# Patient Record
Sex: Male | Born: 1960
Health system: Southern US, Community
[De-identification: ages and names within clinical notes are randomized; demographics above are authoritative.]

## PROBLEM LIST (undated history)

## (undated) DIAGNOSIS — E1169 Type 2 diabetes mellitus with other specified complication: Secondary | ICD-10-CM

## (undated) DIAGNOSIS — E785 Hyperlipidemia, unspecified: Secondary | ICD-10-CM

## (undated) DIAGNOSIS — M5412 Radiculopathy, cervical region: Secondary | ICD-10-CM

## (undated) DIAGNOSIS — I639 Cerebral infarction, unspecified: Secondary | ICD-10-CM

## (undated) DIAGNOSIS — T7840XA Allergy, unspecified, initial encounter: Secondary | ICD-10-CM

## (undated) DIAGNOSIS — E119 Type 2 diabetes mellitus without complications: Secondary | ICD-10-CM

## (undated) HISTORY — DX: Radiculopathy, cervical region: M54.12

## (undated) HISTORY — DX: Type 2 diabetes mellitus with other specified complication: E11.69

## (undated) HISTORY — PX: VARICOSE VEIN SURGERY: SHX832

## (undated) HISTORY — DX: Type 2 diabetes mellitus with other specified complication: E78.5

---

## 2000-08-30 ENCOUNTER — Encounter: Payer: Self-pay | Admitting: Emergency Medicine

## 2000-08-30 ENCOUNTER — Emergency Department (HOSPITAL_COMMUNITY): Admission: EM | Admit: 2000-08-30 | Discharge: 2000-08-30 | Payer: Self-pay | Admitting: Emergency Medicine

## 2000-09-07 ENCOUNTER — Emergency Department (HOSPITAL_COMMUNITY): Admission: EM | Admit: 2000-09-07 | Discharge: 2000-09-07 | Payer: Self-pay | Admitting: Emergency Medicine

## 2000-09-07 ENCOUNTER — Encounter: Payer: Self-pay | Admitting: Emergency Medicine

## 2000-09-07 ENCOUNTER — Encounter: Payer: Self-pay | Admitting: *Deleted

## 2001-04-25 ENCOUNTER — Inpatient Hospital Stay (HOSPITAL_COMMUNITY): Admission: EM | Admit: 2001-04-25 | Discharge: 2001-05-03 | Payer: Self-pay

## 2001-04-25 ENCOUNTER — Encounter (INDEPENDENT_AMBULATORY_CARE_PROVIDER_SITE_OTHER): Payer: Self-pay | Admitting: *Deleted

## 2001-04-27 ENCOUNTER — Encounter: Payer: Self-pay | Admitting: Endocrinology

## 2001-04-29 ENCOUNTER — Encounter: Payer: Self-pay | Admitting: Endocrinology

## 2001-05-01 ENCOUNTER — Encounter: Payer: Self-pay | Admitting: Internal Medicine

## 2001-05-24 ENCOUNTER — Encounter: Payer: Self-pay | Admitting: Thoracic Surgery

## 2001-05-24 ENCOUNTER — Encounter: Admission: RE | Admit: 2001-05-24 | Discharge: 2001-05-24 | Payer: Self-pay | Admitting: Thoracic Surgery

## 2001-07-20 ENCOUNTER — Ambulatory Visit (HOSPITAL_COMMUNITY): Admission: RE | Admit: 2001-07-20 | Discharge: 2001-07-20 | Payer: Self-pay | Admitting: Anesthesiology

## 2001-07-20 ENCOUNTER — Encounter: Payer: Self-pay | Admitting: Anesthesiology

## 2002-04-07 ENCOUNTER — Emergency Department (HOSPITAL_COMMUNITY): Admission: EM | Admit: 2002-04-07 | Discharge: 2002-04-08 | Payer: Self-pay | Admitting: Emergency Medicine

## 2002-04-08 ENCOUNTER — Encounter: Payer: Self-pay | Admitting: Emergency Medicine

## 2002-07-31 ENCOUNTER — Emergency Department (HOSPITAL_COMMUNITY): Admission: EM | Admit: 2002-07-31 | Discharge: 2002-07-31 | Payer: Self-pay | Admitting: Emergency Medicine

## 2002-07-31 ENCOUNTER — Encounter: Payer: Self-pay | Admitting: Emergency Medicine

## 2002-08-13 ENCOUNTER — Emergency Department (HOSPITAL_COMMUNITY): Admission: EM | Admit: 2002-08-13 | Discharge: 2002-08-13 | Payer: Self-pay | Admitting: Emergency Medicine

## 2002-08-14 ENCOUNTER — Ambulatory Visit (HOSPITAL_COMMUNITY): Admission: RE | Admit: 2002-08-14 | Discharge: 2002-08-14 | Payer: Self-pay | Admitting: Emergency Medicine

## 2003-04-02 ENCOUNTER — Ambulatory Visit (HOSPITAL_COMMUNITY): Admission: RE | Admit: 2003-04-02 | Discharge: 2003-04-02 | Payer: Self-pay | Admitting: Endocrinology

## 2003-04-17 ENCOUNTER — Ambulatory Visit (HOSPITAL_COMMUNITY): Admission: RE | Admit: 2003-04-17 | Discharge: 2003-04-17 | Payer: Self-pay | Admitting: Gastroenterology

## 2003-04-17 ENCOUNTER — Encounter (INDEPENDENT_AMBULATORY_CARE_PROVIDER_SITE_OTHER): Payer: Self-pay | Admitting: Specialist

## 2003-07-15 ENCOUNTER — Encounter: Admission: RE | Admit: 2003-07-15 | Discharge: 2003-07-15 | Payer: Self-pay | Admitting: Pulmonary Disease

## 2004-08-18 ENCOUNTER — Encounter: Admission: RE | Admit: 2004-08-18 | Discharge: 2004-08-18 | Payer: Self-pay | Admitting: Endocrinology

## 2005-01-06 ENCOUNTER — Ambulatory Visit (HOSPITAL_COMMUNITY): Admission: RE | Admit: 2005-01-06 | Discharge: 2005-01-06 | Payer: Self-pay | Admitting: Endocrinology

## 2005-07-11 ENCOUNTER — Emergency Department (HOSPITAL_COMMUNITY): Admission: EM | Admit: 2005-07-11 | Discharge: 2005-07-11 | Payer: Self-pay | Admitting: Emergency Medicine

## 2006-02-10 ENCOUNTER — Ambulatory Visit: Payer: Self-pay | Admitting: Internal Medicine

## 2006-03-14 ENCOUNTER — Ambulatory Visit: Payer: Self-pay | Admitting: Internal Medicine

## 2006-08-09 ENCOUNTER — Ambulatory Visit: Payer: Self-pay | Admitting: Internal Medicine

## 2006-08-14 ENCOUNTER — Emergency Department (HOSPITAL_COMMUNITY): Admission: EM | Admit: 2006-08-14 | Discharge: 2006-08-14 | Payer: Self-pay | Admitting: Emergency Medicine

## 2006-10-05 ENCOUNTER — Ambulatory Visit: Payer: Self-pay | Admitting: Internal Medicine

## 2006-11-07 ENCOUNTER — Ambulatory Visit: Payer: Self-pay | Admitting: Internal Medicine

## 2006-11-30 ENCOUNTER — Ambulatory Visit: Payer: Self-pay | Admitting: Internal Medicine

## 2007-01-09 ENCOUNTER — Ambulatory Visit: Payer: Self-pay | Admitting: Internal Medicine

## 2007-01-16 ENCOUNTER — Encounter: Admission: RE | Admit: 2007-01-16 | Discharge: 2007-01-16 | Payer: Self-pay | Admitting: Sports Medicine

## 2007-05-10 DIAGNOSIS — E291 Testicular hypofunction: Secondary | ICD-10-CM | POA: Insufficient documentation

## 2007-05-10 DIAGNOSIS — Q984 Klinefelter syndrome, unspecified: Secondary | ICD-10-CM | POA: Insufficient documentation

## 2007-05-10 DIAGNOSIS — R05 Cough: Secondary | ICD-10-CM

## 2007-05-10 DIAGNOSIS — J45909 Unspecified asthma, uncomplicated: Secondary | ICD-10-CM | POA: Insufficient documentation

## 2007-05-10 DIAGNOSIS — R82998 Other abnormal findings in urine: Secondary | ICD-10-CM | POA: Insufficient documentation

## 2007-05-10 DIAGNOSIS — R059 Cough, unspecified: Secondary | ICD-10-CM | POA: Insufficient documentation

## 2007-05-10 DIAGNOSIS — K219 Gastro-esophageal reflux disease without esophagitis: Secondary | ICD-10-CM | POA: Insufficient documentation

## 2007-05-11 ENCOUNTER — Ambulatory Visit: Payer: Self-pay | Admitting: Internal Medicine

## 2007-05-18 ENCOUNTER — Ambulatory Visit: Payer: Self-pay | Admitting: Internal Medicine

## 2007-05-18 DIAGNOSIS — R0989 Other specified symptoms and signs involving the circulatory and respiratory systems: Secondary | ICD-10-CM | POA: Insufficient documentation

## 2007-05-18 DIAGNOSIS — R0609 Other forms of dyspnea: Secondary | ICD-10-CM | POA: Insufficient documentation

## 2007-06-16 ENCOUNTER — Telehealth (INDEPENDENT_AMBULATORY_CARE_PROVIDER_SITE_OTHER): Payer: Self-pay | Admitting: *Deleted

## 2007-06-19 ENCOUNTER — Telehealth: Payer: Self-pay | Admitting: Internal Medicine

## 2007-07-10 ENCOUNTER — Ambulatory Visit: Payer: Self-pay | Admitting: Internal Medicine

## 2007-07-12 ENCOUNTER — Telehealth (INDEPENDENT_AMBULATORY_CARE_PROVIDER_SITE_OTHER): Payer: Self-pay | Admitting: *Deleted

## 2007-07-16 DIAGNOSIS — R222 Localized swelling, mass and lump, trunk: Secondary | ICD-10-CM | POA: Insufficient documentation

## 2007-07-16 DIAGNOSIS — J64 Unspecified pneumoconiosis: Secondary | ICD-10-CM | POA: Insufficient documentation

## 2007-08-24 ENCOUNTER — Telehealth: Payer: Self-pay | Admitting: Internal Medicine

## 2007-08-29 ENCOUNTER — Ambulatory Visit: Payer: Self-pay | Admitting: Internal Medicine

## 2007-08-29 ENCOUNTER — Ambulatory Visit: Admission: RE | Admit: 2007-08-29 | Discharge: 2007-08-29 | Payer: Self-pay | Admitting: Internal Medicine

## 2007-08-29 ENCOUNTER — Encounter: Payer: Self-pay | Admitting: Internal Medicine

## 2007-09-12 ENCOUNTER — Telehealth: Payer: Self-pay | Admitting: Internal Medicine

## 2007-09-15 ENCOUNTER — Ambulatory Visit: Payer: Self-pay | Admitting: Internal Medicine

## 2007-10-25 ENCOUNTER — Telehealth: Payer: Self-pay | Admitting: Internal Medicine

## 2007-12-11 ENCOUNTER — Telehealth: Payer: Self-pay | Admitting: Internal Medicine

## 2007-12-15 ENCOUNTER — Ambulatory Visit: Payer: Self-pay | Admitting: Internal Medicine

## 2007-12-15 DIAGNOSIS — J018 Other acute sinusitis: Secondary | ICD-10-CM | POA: Insufficient documentation

## 2010-04-16 ENCOUNTER — Encounter: Admit: 2010-04-16 | Payer: Self-pay | Admitting: Endocrinology

## 2010-09-01 NOTE — Op Note (Signed)
NAME:  [name], [name] NO.:  [id number]   MEDICAL RECORD NO.:  [id number]          PATIENT TYPE:  AMB   LOCATION:  CARD                         FACILITY:  [facility]   PHYSICIAN:  Clinton D. Maple Hudson, MD, FCCP, FACPDATE OF BIRTH:  14-Mar-1959   DATE OF PROCEDURE:  08/29/2007  DATE OF DISCHARGE:                               OPERATIVE REPORT   OPERATION/PROCEDURE:  Bronchoscopy.   INDICATIONS FOR PROCEDURE:  A 50 year old man with debilitating chronic  cough,  remote history of mediastinoscopy for mediastinal adenopathy  which had shown sinus histiocytes, occupational history of exposure to  respiratory irritant fumes.  Bronchoscopy is performed to evaluate for  endobronchial abnormality contributing to cough.  Chest x-ray and chest  CT scan views were reviewed on computer on the day before the procedure  with no significant parenchymal abnormality.  See outpatient history and  physical information.   DESCRIPTION OF PROCEDURE:  After fully informed consent, bronchoscopy  was performed on an outpatient basis in the endoscopy suite.  The upper  airway was anesthetized with topical Xylocaine.  A cumulative dose of 5  mg of intravenous Versed was given for additional sedation and cough  control.  Supplemental oxygen was provided to hold saturation over 93%.  Cardiac monitor showed normal sinus rhythm.  A video fiberscope was  introduced via the right nostril to the level of the vocal cords without  difficulty.  The cords moved normally.  Cough was moderate.  The trachea  and main carina were unremarkable.  Sequential examination of each lobar  and segmental airway to the fourth division level was performed.  There  was a generalized mild bronchitis with friable mucosa.  Cough continued  during the procedure and secretions became foamy and blood-tinged  suggesting an edema component.  There was no stridor or apparent  respiratory distress other than the cough.  At one point  fluid was seen  passing the scope tip suggestive of an aspiration event, small in volume  and not discolored.  The right middle lobe and right upper lobe were  lavaged with saline.  Under fluoroscopic guidance,  brushing was  performed in the anterior segment of the right upper lobe and in the  right middle lobe returning clear fluid.  No endobronchial lesions,  foreign material or other obvious abnormality was seen except as  described.  The procedure was concluded without apparent complication.  He was conversational on the bronchoscope was removed.  He will be held  until stable and then released home with family to outpatient follow-up.   FINAL IMPRESSION:  Nonspecific chronic bronchitis.      Clinton D. Maple Hudson, MD, Tonny Bollman, FACP  Electronically Signed     CDY/MEDQ  D:  08/29/2007  T:  08/29/2007  Job:  [id number]

## 2010-09-01 NOTE — Assessment & Plan Note (Signed)
[facility]                             PULMONARY OFFICE NOTE   NAME:[name], Ricky GRABE                     MRN:          [id number]  DATE:01/09/2007                            DOB:          29-Jan-1961    PROBLEMS:  1. Cough/occupational asthma.  2. Elevated chrome level in his hair.  3. Elevated urine creatinine of 3.5.  4. Esophageal reflux.  5. Klinefelter's syndrome/hypogonadism.  6. History of mediastinal biopsy/histiocytosis/adenopathy.   HISTORY:  Less cough using Asmanex on a regular basis. He has felt  fairly stable. No new respiratory issues. He had seen the nurse  practitioner in August and was treated for a URI, and that has resolved.  Medication list is reviewed without changes noted except that he is  using Asmanex 1 puff b.i.d.   OBJECTIVE:  Weight 246 pounds, pulse regular. Dry cough with forced  expiration, but no wheeze. Heart sounds regular without murmur.   IMPRESSION:  Chronic bronchitis and occupational asthma, basic pattern  is stable. He seems to be benefitting from maintenance steroid inhaler.   PLAN:  Continue Asmanex. Schedule return 4 months, earlier p.r.n.     Clinton D. Maple Hudson, MD, Tonny Bollman, FACP  Electronically Signed    CDY/MedQ  DD: 01/14/2007  DT: 01/15/2007  Job #: [id number]   cc:   Brooke Bonito, M.D.

## 2010-09-01 NOTE — Assessment & Plan Note (Signed)
[facility]                             PULMONARY OFFICE NOTE   NAME:TEAGUEKeefe, Zawistowski                     MRN:          [id number]  DATE:11/30/2006                            DOB:          07-30-1960    HISTORY OF PRESENT ILLNESS:  The patient is a 50 year old white male  patient of Dr. Roxy Cedar who has a known history of occupational asthma,  cyclical cough, and esophageal reflux.  The patient presents today for  an acute office visit complaining of a 3-day history of nasal  congestion, sore throat, ear pain, cough, and fever and chills.  The  patient reports that he has severe coughing paroxysms to the point where  it has caused him to vomit.  The patient denies any hemoptysis,  orthopnea, PND, leg swelling, abdominal pain, nausea, bloody stools or  diarrhea.   PAST MEDICAL HISTORY:  Reviewed.   CURRENT MEDICATIONS:  Reviewed.   PHYSICAL EXAMINATION:  The patient is a pleasant male in no acute  distress.  He is afebrile with stable vital signs.  HEENT:  Nasal mucosa is pink and moist.  Nontender sinuses.  TMs are  normal.  Posterior pharynx is with some mild erythema, no exudate.  NECK:  Supple without cervical adenopathy.  No JVD.  LUNG SOUNDS:  Reveal coarse breath sounds with a few faint wheezes.  CARDIAC:  Regular rate.  ABDOMEN:  Soft and nontender.  No palpable hepatosplenomegaly.  EXTREMITIES:  Warm without an calf tenderness, cyanosis, clubbing, or  edema.   IMPRESSION AND PLAN:  Acute upper respiratory infection with a mild  asthmatic flare.  The patient is given a Xopenex nebulizer treatment  today in the office.  Asked to begin a short prednisone taper over the  next week and may use Phenergan VC with codeine for cough control.  Add  in Mucinex DM twice daily.  The patient will return back with Dr. Maple Hudson  as scheduled or sooner if needed.      Rubye Oaks, NP  Electronically Signed      Clinton D. Maple Hudson, MD, Tonny Bollman, FACP  Electronically Signed   TP/MedQ  DD: 12/01/2006  DT: 12/02/2006  Job #: [id number]

## 2010-09-01 NOTE — Assessment & Plan Note (Signed)
[facility]                             PULMONARY OFFICE NOTE   NAME:[name], [name]                     MRN:          [id number]  DATE:10/05/2006                            DOB:          [birth year]    HISTORY OF PRESENT ILLNESS:  Patient is a 50 year old white male patient  of [name], who has a known history of occupational asthma with  previous exposure to elevated chromium levels.  Patient presents today  for an acute office visit with two-day history of increased dry cough,  wheezing and shortness of breath.  Patient had recently been laid off  from work two months ago with substantial improvement in his symptoms,  maintained on Azmacort two puffs three times a day.  Patient recently  started back to work, working outside in extreme temperatures of greater  than 100 degrees outside with heavy protective clothing on.  Patient  reports that his breathing worsened acutely with increased shortness of  breath, dry cough and wheezing.  The patient denies any chest pain,  orthopnea, PND or leg swelling, fever or purulent sputum.  Patient did  start prednisone yesterday with only minimum improvement in symptoms.   PAST MEDICAL HISTORY:  Reviewed.   CURRENT MEDICATIONS:  Reviewed.   PHYSICAL EXAM:  Patient is a pleasant male, in no acute distress.  He is  afebrile with stable vital signs.  His O2 saturation is 97% on room air.  HEENT:  Unremarkable.  NECK:  Supple without cervical adenopathy.  No JVD.  LUNG SOUNDS:  Reveal coarse breath sounds bilaterally.  CARDIAC:  Regular rate.  ABDOMEN:  Soft and nontender.  EXTREMITIES:  Warm without any edema.   IMPRESSION AND PLAN:  Acute asthmatic flare.  Probably secondary to  environmental triggers.  Patient was given a Xopenex nebulizer treatment  in the office.  He is to finish prednisone as scheduled.  Add in Mucinex  DM twice daily.  May use Tussionex as needed for cough control.  Continue on  reflux prevention, which could be irritating the upper  airways.  Patient has been recommended to avoid extreme temperatures,  which seem to be exacerbating his asthma symptoms, and has been given a  work note.  I have discussed in detail with this patient concerning  possibility that he is going to be unable to do work that will expose  him to  extreme temperatures and heat or chemicals or fumes.  Patient will  return back with [name] in two weeks, or sooner, if needed.      Rubye Oaks, NP  Electronically Signed      Ricky D. Maple Hudson, MD, Tonny Bollman, FACP  Electronically Signed   TP/MedQ  DD: 10/05/2006  DT: 10/05/2006  Job #: [id number]

## 2010-09-01 NOTE — Assessment & Plan Note (Signed)
[name]                             PULMONARY OFFICE NOTE   NAME:[name], [name]                     MRN:          [id number]  DATE:11/07/2006                            DOB:          [birth year]    PULMONARY OFFICE FOLLOWUP   PROBLEM LIST:  1. Cough/occupational asthma.  2. Elevated chrome level in his hair.  3. Elevated urine creatinine of 3.5.  4. Esophageal reflux.  5. Klinefelter's syndrome/hypogonadism.  6. History of mediastinal biopsy/histiocytosis/adenopathy.   HISTORY:  He was seen by the nurse practitioner because of increased  cough, wheeze, and shortness of breath that seemed related to his  attempts to work a new job for a while where he had to wear heavy  protective clothing outdoors in high temperatures.  He is now applying  for disability.  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  Azmacort may help some being used 2 puffs q.i.d. still with samples.  He  does not feel reflux symptoms and had been told to try tapering off of  Nexium to see what effect that would have.  Cough remains nonproductive.  He does not seem short of breath sitting quietly and he has not had  exertional chest pain.   MEDICATIONS:  1. Crestor.  2. AndroGel.  3. Nexium 40 mg b.i.d.  4. Aspirin 325 mg.  5. He did not start the metformin that was prescribed 500 mg daily.  6. He continues Azmacort 2 puffs q.i.d.  7. Has not been needing albuterol.  8. Has used occasional Tussionex.  No medication allergy.   OBJECTIVE:  Weight 249 pounds, BP 142/84, pulse 86, room air saturation  96%.  Intermittent nonproductive cough.  No wheeze or rhonchi.  I do not find adenopathy.  Neck veins are not distended.  There is no visible post-nasal drainage or pharyngeal erythema.  Voice  quality is normal.  Heart  sounds seem regular without murmur or gallop.  EXTREMITIES:  Without cyanosis, clubbing, or edema.   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  Cough is a significant distracting factor, which will by  itself prevent him from participating in some jobs I would expect.  Certainly, he needs to stay away from any job associated with  respiratory irritants, smokes, strong odors, or temperature extremes.   PLAN:  1. Try changing Azmacort to a more potent steroid inhaler giving      Asmanex 1 puff b.i.d.  2. Scheduled return to 3 months to assess change in fall weather.     Clinton D. Maple Hudson, MD, Tonny Bollman, FACP  Electronically Signed    CDY/MedQ  DD: 11/09/2006  DT: 11/10/2006  Job #: [id number]   cc:   Brooke Bonito, M.D.

## 2010-09-04 NOTE — Op Note (Signed)
NAME:  [name], [name] NO.:  [id number]   MEDICAL RECORD NO.:  [id number]                   PATIENT TYPE:  AMB   LOCATION:  ENDO                                 FACILITY:  [facility]   PHYSICIAN:  John C. Madilyn Fireman, M.D.                 DATE OF BIRTH:  1960/09/19   DATE OF PROCEDURE:  04/17/2003  DATE OF DISCHARGE:                                 OPERATIVE REPORT   PROCEDURE:  Colonoscopy with polypectomy.   INDICATIONS FOR PROCEDURE:  Family history of colon cancer in a first-degree  relative.   DESCRIPTION OF PROCEDURE:  The patient was placed in the left lateral  decubitus position and placed on the pulse monitor with continuous low-flow  oxygen delivered by nasal cannula.  He was sedated with 75 mcg IV fentanyl  and 6 mg IV Versed.  The Olympus video colonoscope was inserted into the  rectum and advanced to the cecum, confirmed by transillumination of  McBurney's point and visualization of the ileocecal valve and appendiceal  orifice.  Prep was excellent.  The cecum and ascending colon appeared normal  with no masses, polyps, diverticula, or other mucosal abnormalities.  There  was a 6-7 cm sessile polyp in the transverse colon that was fulgurated by  hot biopsy.  The remainder of the transverse, descending, sigmoid and rectum  appeared normal with no further polyps, masses, diverticula or other mucosal  abnormalities.  The colonoscope was then withdrawn and the patient returned  to the recovery room in stable condition.  He tolerated the procedure well  and there were no immediate complications.   IMPRESSION:  Small transverse colon polyp; otherwise normal study.   PLAN:  Await histology and probably repeat colonoscopy in five years based  on the same history.                                               John C. Madilyn Fireman, M.D.    JCH/MEDQ  D:  04/17/2003  T:  04/17/2003  Job:  [id number]   cc:   Jeannett Senior A. Evlyn Kanner, M.D.  [address]  [place]  [place] [address]  Fax: [phone]

## 2010-09-04 NOTE — Cardiovascular Report (Signed)
[facility]  Patient:    [name], [name] Visit Number: [id number] MRN: [id number]          Service Type: MED Location: [phone] Attending Physician:  Julian Hy Dictated by:   Runell Gess, M.D. Proc. Date: 04/26/01 Admit Date:  04/25/2001   CC:         Second Floor [facility]  Jeannett Senior A. Evlyn Kanner, M.D., [facility] [address], [place] [address]   Cardiac Catheterization  PROCEDURE:  Cardiac catheterization.  CARDIOLOGIST:  Runell Gess, M.D.  INDICATIONS:  [name] is a 50 year old married white male with adopted children, who has Klinefelter disease and hypogonadism, admitted January 7 with chest pain, rule out MI.  He has no cardiac risk factors.  His symptoms were typical, and his CPKs were elevated, though his MBs and troponins were negative.  He had no EKG changes.  He presents now for diagnostic coronary arteriography.  DESCRIPTION OF PROCEDURE:  The patient was brought to the second floor  Cardiac Catheterization lab in the postabsorptive state.  He was premedicated with p.o. Valium.  His right groin was prepped and shaved in the usual sterile fashion.  Xylocaine 1% was used for local anesthesia.  A 6-French sheath was inserted into the right femoral artery using standard Seldinger technique.  Then 6-French right and left Judkins diagnostic catheters along with a 6-French pigtail catheter were used for selective coronary angiography, left ventriculography, supravalvular aortography in the LAO cranial view.   Omnipaque dye was used for the entirety of the diagnostic case.  Retrograde aortic, left ventricular, and pullback pressures were recorded.  HEMODYNAMICS: 1. Aortic systolic pressure 104, diastolic pressure 68. 2. Left ventricular systolic pressure 107, diastolic pressure 11.  SELECTIVE CORONARY ANGIOGRAPHY: 1. Left  Main: Normal. 2. LAD: Normal. 3. Left Circumflex:  Dominant and normal. 4. Right coronary artery:  Nondominant and normal.  LEFT VENTRICULOGRAPHY:  RAO left ventriculogram was performed using 25 cc of Omnipaque dye at 12 cc/second.  The overall LV EF was estimated at greater than 60% without focal wall motion abnormalities.  SUPRAVALVULAR AORTOGRAPHY:  Supravalvular aortography was performed in the LAO cranial view using 20 cc of Omnipaque dye at 20 cc/second x 2.  There was trace AI noted.  There was no evidence of aortic dissection.  All vessels were intact.  IMPRESSION:  [name] has normal coronary arteries, normal LV function, and no evidence of aortic dissection.  I believe his chest pain is noncardiac and most like musculoskeletal/pleuritic.  The sheaths were removed and pressure held to the groin to achieve hemostasis.  The patient left the lab in stable condition.  PLAN:  Discharge him tomorrow hopefully on nonsteroidal anti-inflammatory drug.  He will see me back as needed.  He left the lab in stable condition. Dictated by:   Runell Gess, M.D. Attending Physician:  Julian Hy DD:  04/26/01 TD:  04/26/01 Job: [id number]
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
## 2010-09-04 NOTE — Assessment & Plan Note (Signed)
[facility]                             PULMONARY OFFICE NOTE   NAME:TEAGUETeagon, [name]                     MRN:          [id number]  DATE:03/14/2006                            DOB:          Jan 16, 1961    PROBLEM:  1. Cough/occupational asthma.  2. Elevated chrome level in his hair.  3. Elevated urine creatinine of 3.5.  4. Esophageal reflux.  5. Klinefelter's syndrome/hypogonadism.  6. History of mediastinal biopsy/history of cytosis/adenopathy.   HISTORY:  He still has a lot of cough after trying Nexium 40 mg daily.  He could not tell that Advair previously had helped any.  There is  little sputum and not much wheeze.  He has tried Symbicort 160/4.5 given  at his initial visit here but still has persistent cough and a sense of  pressure standing on his chest.  There has been no real pain.  No  fever or adenopathy.  No edema and he is not actively aware of any  change in reflux symptoms.   MEDICATION:  1. Crestor.  2. AndroGel.  3. Nexium.  4. Aspirin 325 mg.  5. Albuterol HFA.   NO MEDICATION ALLERGY.   Note that he recently finished Symbicort 160/4.5 and Singulair without  noting a benefit.  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   OBJECTIVE:  Weight 235 pounds.  BP 122/80.  Pulse regular 96.  Room air  saturation 95%.  Dry cough, otherwise no distress.  Breath sounds are a little diminished because he cannot relax to take a  full deep breath but I hear no wheeze, rales, or rhonchi.  There is no  stridor or neck pain distention.  Heart sounds are regular without murmur.  No visible postnasal drip.  No  cyanosis or clubbing.   Spirometry from January 06, 2005, had been within normal limits with a  slight response to bronchodilator only in the small airway/mid flows.  MVV was somewhat reduced  suggesting weakness or weak effort.   IMPRESSION:  1. Active cough likely to represent ongoing airway irritation most      consistent with occupational asthma and best treated by removal      from that exposure.  2. I am not convinced that the chrome levels by themselves are      contributing to current symptoms but I would like him to have and      occupational health evaluation.  He does not have Science writer.  We had discussed referral and he would      like referral to the [facility] Occupational Health program recognizing      he may be personally responsible for the bills in the absence of      Workman's Comp.   PLAN:  1. Prednisone 8-day taper from 40 mg daily using 5 mg tablets with      careful steroid discussion done.  2. Appointment with Dr. Raynelle Jan at [facility]  Occupational Health      Program on November 26.  3. I will be happy to see him again here if I can be helpful.     Clinton D. Maple [place], MD, [facility], FACP  Electronically Signed    CDY/MedQ  DD: 03/19/2006  DT: 03/21/2006  Job #: [id number]   cc:   Brooke Bonito, M.D.

## 2010-09-04 NOTE — Discharge Summary (Signed)
[place]  Patient:    [name], [name] Visit Number: [id number] MRN: [id number]          Service Type: MED Location: [phone] Attending Physician:  Julian Hy Dictated by:   Tera Mater Evlyn Kanner, M.D. Admit Date:  04/25/2001 Discharge Date: 05/03/2001                             Discharge Summary  DISCHARGE DIAGNOSES: 1. Chest pain, source uncertain despite multiple tests. 2. Splenomegaly. 3. Klinefelter syndrome. 4. Mediastinal and paratracheal adenopathy, biopsy showing histiocytosis. 5. Probable early impaired glucose tolerance. 6. Hypergonadism.  PROCEDURES: 1. Rule out myocardial infarction protocol. 2. Cardiac catheterization. 3. CT of the chest. 4. Abdominal ultrasound. 5. Mediastinoscopy and bronchoscopy. 6. Telemetry monitoring.  HISTORY OF PRESENT ILLNESS:  Mr. Bihl is a 50 year old white male with a history of Klinefelter syndrome, splenomegaly, and hypergonadism, who presented to my partner with chest pain on 04/26/01.  He had very typical sounding substernal chest pain with relief with nitroglycerin and arm radiation of the pain.  Given the typical nature and course of this, as well as abnormal cardiac enzymes at presentation, he was seen promptly by cardiology, and underwent a cardiac catheterization.  Fortunately, this did not show a cardiac defect of any type.  His pain continued, and GI consultation was undertaken, who thought this could be musculoskeletal.  The initial therapy trying to treat inflammation was unsuccessful, both with steroids and nonsteroidals.  He has required significant pain medications throughout his hospitalization to help keep this pain under control, and in fact, it still does continue to the present with only partial relief.  Due to the continued and worsening pain situation, he underwent a CT of the chest on 04/27/01, to rule out aortic dissection, and a right paratracheal mass  was found.  He was seen by Dr. Karle Plumber, and underwent mediastinoscopy and bronchoscopy, fortunately showing no carcinoma or specific diagnosis underlying this.  His continued pain and need for IV medications has required an extended hospitalization.  We have gone through a variety of possible diagnostic possibilities, including rule out for cardiac disease, pulmonary embolus, dissection, GI source, etc., and we are basically left with the lack of a specific diagnosis despite these multiple problems.  This extensive evaluation was undertaken due to his lack of prior history of any pain-seeking behavior, as well as the fact that he did have abnormal cardiac enzymes and pain that was clearly relieved with nitroglycerin.  At the present time, we have reached probable maximal hospital benefit.  We still have pain control issues, and these will be addressed by the addition of Neurontin 300 mg at bedtime to see if we cannot help this situation.  New problems identified while here include some abnormal sugars while on steroids, as well as some dyslipidemia which may require therapy.  Due to the muscular nature of this complaint, I am reluctant to start his statin yet, the numbers are not high enough to be dangerous at this moment.  There had been ongoing questions by the family about whether there is any relationship to the fact that the patient has worked in a [employer]	[place] for 24 years, first around ______ sulfate, and then around hydrochloric acid and hydrofluoric acid more recently.  I cannot find any direct toxic nature to this current illness, although certainly we have not done heavy metal stains or anything other than these  specimens.  LABORATORY DATA:  TSH 0.379.  Urine was basically negative.  Initial white count was 6400, hemoglobin 15.9, platelets 170,000, MCV 86.  Blood gas showed a pH of 7.420, PCO2 of 42, bicarbonate of 27.  INR 1.1, PTT 30.  Initial sodium 140, potassium  3.4, chloride 103, CO2 29, BUN 16, creatinine 1.0, calcium 9.7.  Total protein 7.1, albumin 4.1.  Glucose went as high as 183 on one occasion post-prandially.  Potassium slightly low at 3.4.  First CPK was 916, MB 8.9, relative index of 1.  Repeat was CPK 651, MB 6.5, relative index 1.0.  Troponins were 0.01 and 0.01.  Repeat CPK was 175, CK 1.1.  CK yesterday was down to normal range at 41.  His LDL was 155, HDL 42, triglycerides 169, total cholesterol 231.  A benign lymph node was the final diagnosis with sinus histocytosis, pigmented macrophagia, no evidence of malignancy.  Brushings and washings showed no malignant cells.  Ultrasound of the abdomen was normal.  CT of the chest showed no evidence of aortic dissection or PE.  There was a 3.2 cm soft tissue mass which is what was biopsied.  Chest x-ray showed subsegmental atelectasis and lingula.  A cardiac catheterization showed normal coronary arteries, normal pressures, normal valves, and normal ejection fraction.  Echocardiogram showed normal left ventricular size, overall left ventricular systolic function was normal at 55 to 65%.  There was signal drop out in the intra-atrial septum, and it was said that they could not exclude patent foramen ovale or ASD, but there was no clinically indication to follow up on this at the present.  Electrocardiogram was sinus rhythm without ischemic changes.  There were no problems like this noted on the cardiac catheterization or pressure changes noted on the cardiac catheterization.  In summary, we have a 50 year old white male presenting with an acute chest pain syndrome with abnormal CPK, and the possibility of cardiac disease was entertained.  He has undergone a very extensive workup with a still somewhat obscured diagnosis.  This has required a prolonged hospitalization.  He is now discharged home in improved, but not normal condition, given he is still having pain.  His pain management will  start with Percocet two pills q.6h. p.r.n., he will be given 60 pills.  He will be continued on Protonix.  He will go back on his Androgel.  He will be on an aspirin daily, and he will have a  new medication of Neurontin 300 mg at bedtime added.  FOLLOWUP: 1. With a pulmonologist for an outpatient consult. 2. He will be seen by me in two weeks as well.  Reports of pain or other problems will be undertaken as clinically indicated. Dictated by:   Tera Mater Evlyn Kanner, M.D. Attending Physician:  Julian Hy DD:  05/03/01 TD:  05/04/01 Job: [id number]

## 2010-09-04 NOTE — Assessment & Plan Note (Signed)
[facility]                             PULMONARY OFFICE NOTE   NAME:TEAGUEGlenford, Garis                     MRN:          [id number]  DATE:08/09/2006                            DOB:          1960-05-28    PROBLEMS:  1. Cough/occupational asthma.  2. Elevated chrome level in his hair.  3. Elevated urine creatinine of 3.5.  4. Esophageal reflux.  5. Klinefelter's syndrome/hypogonadism.  6. History of mediastinal biopsy/histiocytosis/adenopathy.   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  He has not  been able to get established with an occupational health firm,  apparently because they want to have Worker's Compensation coverage,  which he does not have.  Dr. Juleen China has given him some Skelaxin for a  sore neck.  Prednisone taper that we gave in November seemed to help  temporarily with his breathing.  Mainly, he notices persistent dry  cough.   MEDICATIONS:  Crestor, AndroGel, Nexium, aspirin 325 mg, multivitamin  p.r.n., albuterol, Skelaxin.   No medication allergy.   OBJECTIVE:  VITAL SIGNS:  Weight 242 pounds.  BP 126/86.  Pulse regular  at 95.  Room air saturation 96%.  HEENT:  Dry cough.  No wheeze.  No dullness.  No adenopathy or stridor.  No neck vein distention.  His pharynx is clear.  HEART:  Heart sounds are regular without murmur or gallop.  ABDOMEN:  I do not feel enlargement of liver or spleen.  EXTREMITIES:  There is no edema.   IMPRESSION:  Occupational asthma:  Remains the most appropriate  diagnosis at this point, given his prolonged exposure to metal plating.  Getting out of that environment may be the best thing that could happen  to him.  The bronchodilator medications do not seem to have helped.  If  prednisone helped, then we may get some benefit from an inhaled  cortisone on a maintenance basis.  I am giving him some samples we have  available.   PLAN:  1. Watch for change in his pulmonary status and  cough, as he remains      out of the metal plating environment.  2. Samples of Azmacort 2 puffs t.i.d.  3. We tried again to get him established with an occupational health      program but will schedule him back here in about 4-6 weeks.     Clinton D. Maple [place], MD, [facility], FACP  Electronically Signed    CDY/MedQ  DD: 08/10/2006  DT: 08/11/2006  Job #: [id number]   cc:   Brooke Bonito, M.D.

## 2010-09-04 NOTE — Op Note (Signed)
[place]  Patient:    [name], [name] Visit Number: [id number] MRN: [id number]          Service Type: MED Location: [phone] Attending Physician:  Julian Hy Dictated by:   D. Karle Plumber, M.D. Proc. Date: 05/01/01 Admit Date:  04/25/2001   CC:         Jeannett Senior A. Evlyn Kanner, M.D.   Operative Report  PREOPERATIVE DIAGNOSIS:  Chest pain, right azygous node adenopathy.  POSTOPERATIVE DIAGNOSIS:  Chest pain, right azygous node adenopathy.  PROCEDURE:  Fiberoptic bronchoscopy with mediastinoscopy.  ANESTHESIA:  General endotracheal anesthesia.  DESCRIPTION OF PROCEDURE:  Fiberoptic bronchoscope was passed through the endotracheal tube.  The distal trachea had some laxity of the posterior membrane such that it was somewhat impinging on the trachea circumference. The right main stem and left main stem bronchus were normal.  The left upper lobe, left lower lobe, right upper lobe and right middle lobe and right lower lobe were essentially all normal.  No lesions were seen particularly in the left lower lobe and the right lower lobe where there had been some atelectasis.  Cytologies and washings were taken.   Then the anterior neck was prepped with and draped in the usual sterile manner.  A transverse incision was made at the sternal notch and carried down with electrocautery to the subcutaneous tissues.  The pretracheal fascia was entered.  The video ______scope was inserted and exploration was carried out and a 4R node was biopsied.  Strap muscles were closed with 2-0 Vicryl subcutaneous tissue with 3-0 Vicryl, Steri-Strips were applied.  The patient returned to the recovery room in stable condition. Dictated by:   D. Karle Plumber, M.D. Attending Physician:  Julian Hy DD:  05/01/01 TD:  05/01/01 Job: [id number]

## 2010-09-04 NOTE — Assessment & Plan Note (Signed)
[facility]                               PULMONARY OFFICE NOTE   NAME:Racz, NOSSON WENDER                     MRN:          [id number]  DATE:02/10/2006                            DOB:          [birth year]    A pulmonary consultation for this 50 year old non-smoking man who complains  of chronic cough.   HISTORY:  He complains of cough which has been troublesome for at least 2  years.  He had seen Dr. Marcos Eke at this office, as far back as January of  2005, with cough attributed to poorly-controlled asthma and esophageal  reflux.  He had had a mediastinoscopy for mediastinal adenopathy in 2003,  with a diagnosis of histiocytosis.  His FEV1 had been as good as 90% of  predicted and as low as 68% of predicted in 2005.  At that time, he had  Advair which had caused throat irritation, and he had tried Spiriva without  much benefit.  He coughs more at work.  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 Juleen China, showing elevated chromium  level in a hair sample at 2 mcg per g (usually 0.26 to 1.46 mcg/g).  They  had looked up chrome exposure, finding descriptions of increased incidence  of asthma, allergy to chrome and increased risk of cancer.  He and his wife  say he coughs less when he is away for a week of vacation and coughs more at  work.  He does not notice effect of weather, season or pollen.  He gets  short of breath singing.  It is a little hard to determine how easily he  gets short of breath walking, but it is more than one block.  He has a heavy  sensation on his chest.  His cough wakes his wife at night.  Albuterol as a  rescue inhaler has not seemed to make much difference.   MEDICATIONS:  1. Crestor.  2. AndroGel.  3. Nexium.  4. Aspirin 325 mg.  5. Albuterol inhaler.   ALLERGIES:  NO MEDICATION ALLERGY.   REVIEW OF SYSTEMS:  No  fever, blood or purulent discharge.  No joint pain or  rash, except that he is begin treated for plantar fasciitis.  No seasonal  rhinitis complaints or significant nasal congestion.  No peripheral edema.  He does notice exertional dyspnea, non-productive cough, tussive heaviness,  not really pain in the chest, no palpitation.  Acid reflux symptoms are  prevented by Nexium, and he denies dysphagia.  Weight has been stable.   PAST HISTORY:  Esophageal reflux, mediastinoscopy in 2003 by Dr. Edwyna Shell for  diagnosis histiocytosis.  Kidney stones, remote upper endoscopy, chronic  headaches, no elevated cholesterol.   SOCIAL HISTORY:  He had never smoked, but his second-hand exposure to his  parents were significant.  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  He is now  working as a Water engineer  receiving clerk but says the fumes from the  process aer significant in his work area.  He has been employed there 29  years.  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  He  says nobody else in the workplace has a significant cough.   FAMILY HISTORY:  Several with heart disease.   ADDITIONAL PAST HISTORY:  Klinefelter's/hypogonadism.  Note that he did use  a respirator at work.  Dr. Dorena Cookey has done upper and lower endoscopies  at some time in the past.   OBJECTIVE:  GENERAL:  Weight 236 pounds.  This is a tall man, not overweight  for his height.  VITAL SIGNS:  BP 102/62, pulse regular 85, room air saturation 95%.  SKIN:  Flushed, attributed to AndroGel.  No rash otherwise, no adenopathy  found.  HEENT:  Tonsils are prominent without exudates, long palate.  Nasal airway  is not obstructed.  There is no neck vein distention, thyromegaly or  stridor.  Voice quality  is normal.  CHEST:  Persistent dry cough without wheeze, rales or rhonchi.  Work of  breathing is not increased.  HEART:  Sounds are regular without murmur or gallop.  ABDOMEN:  No enlargement of liver or spleen.  EXTREMITIES:  No cyanosis, clubbing, edema or tremor.   IMPRESSION:  1. Cough, probably attributable to occupational asthma.  2. Elevated chrome level in his hair.  Medical significance at this level      in exposure is unclear.  I told him I would try to find appropriate      referral on this.  3. He had an elevated creatinine level of 3.50.  Urine creatinine random      specimen with a chromium/creatinine ratio of 0.5 (normal range 0 to      4.9).  4. Esophageal reflux.  5. Klinefelter's syndrome/hypogonadism.  6. History of mediastinal biopsy/histiocytosis/adenopathy.   PLAN:  1. We discussed the practicality of getting another job completely away      from this exposure.  I told him I can not tell whether he is      specifically sensitized or this is just an irritant effect, until he is      away from it for probably several weeks.  We did discuss the      possibility of taking him out of work, and he is going to consider what      is available as alternative employment.  2. Consider Barium swallow or referral back for GI followup to establish      current status of his reflux.  3. Try sample Symbicort 160/4, two puffs b.i.d. rinsing mouth.  4. Sample Singulair 10 mg daily.  5. Schedule return one month, earlier p.r.n.   I appreciate the chance to meet this gentleman.  He may need vocational  rehabilitation referral.     Clinton D. Maple [place], MD, [facility], FACP    CDY/MedQ  DD: 02/12/2006  DT: 02/14/2006  Job #: [id number]   cc:   Brooke Bonito, M.D.

## 2010-09-04 NOTE — H&P (Signed)
[place]  Patient:    [name], LITLE Visit Number: [id number] MRN: [id number]          Service Type: MED Location: [phone] Attending Physician:  Julian Hy Dictated by:   Gaspar Garbe, M.D. Admit Date:  04/25/2001   CC:         Jeannett Senior A. Evlyn Kanner, M.D.                         History and Physical  CHIEF COMPLAINT:  Chest pain.  HISTORY OF PRESENT ILLNESS:  The patient is a 50 year old white male with a history of Klinefelters and hypogonadism who indicated that he had chest pain starting at 9:15 a.m. today.  He has never had pain to considerable extent before.  Called the office late in the day and was told to go to the emergency room where he was evaluated.  The patient arrived with a pain level of 7-8/10.  Was given nitroglycerin sublingually which initially dropped his pain to a 5 and with the addition of nitropaste his pain has dropped to 0.  Patient indicated at home he had tried Tums and Rolaids without relief.  He does not indicate any inciting incident of musculoskeletal damage or strain which may be contributing to his chest pain.  He indicates that if he sits up or he moves around that the pain seems to get worse.  He also indicates that he has pain with left shoulder movement as well.  The patient notes no history of prior cardiac test or treadmill test and has never had a personal history of myocardial infarction.  ALLERGIES:  No known drug allergies.  MEDICATIONS:  Androgel 1% applied each morning.  He has previously been on Zoloft, but has discontinued this in the past several months.  PAST MEDICAL HISTORY: 1. Klinefelters syndrome. 2. Hypogonadism. 3. Depression.  PAST SURGICAL HISTORY:  History of spinal tap per Dr. Evlyn Kanner, but no other procedures performed.  SOCIAL HISTORY:  The patient lives in [place] with his wife and daughter. 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   He notes no tobacco history and quit drinking alcohol 19 years ago.  FAMILY HISTORY:  Mother died at age 40 of congestive heart failure.  Father did not have any heart problems.  Paternal grandmother had a pacer and heart problems.  He has one brother and one sister, both who do not have heart problems.  REVIEW OF SYSTEMS:  The patient notes a history of chills and headaches with some chest pain and dyspnea on exertion and episode of palpitations earlier today.  He also notes that he has a slight cough as well as myalgias and arthralgias as well as noting some dysuria in the past couple of days.  Around Christmas time he indicated that he was drinking cranberry juice and had resolution of his symptoms.  All other symptoms are currently negative.  Per directives, the patient is a "Full Code."  PHYSICAL EXAMINATION  VITAL SIGNS:  Temperature 98.0, pulse 79, respiratory rate 16, blood pressure 118/55, pain 0/10 currently.  GENERAL:  No acute distress.  HEENT:  Normocephalic, atraumatic.  PERRLA.  EOMI.  ENT:  Within normal limits.  NECK:  Supple.  No lymphadenopathy, JVD, or bruit.  LUNGS:  Clear to auscultation bilaterally.  HEART:  Regular rate and rhythm.  Normal S1, S2.  No murmur, rub, or gallop appreciated with normal PMI.  Pulses 2+, equal, without bruits bilaterally. Chest pain occurs with palpation of left chest into left shoulder.  ABDOMEN:  Soft, nontender, normoactive bowel sounds.  No hepatosplenomegaly.  EXTREMITIES:  No cyanosis, clubbing, or edema.  MUSCULOSKELETAL:  The patient has pain with movement of his left shoulder and pain into the biceps tendon along head to palpation.  NEUROLOGIC:  Cranial nerves 2-12 are intact.  Strength 5/5 bilaterally.  LABORATORIES:  Chest x-ray:  The patient has left subsegmental atelectasis with no infiltrate noted.  EKG shows a rate of 66 with normal sinus rhythm and normal axis, PR interval 133, QRS 100, QTC 415.  He has  some flattened T-waves in the lateral leads but no ischemic changes are noted and no hypertrophy noted.  White count 6.4, hemoglobin 15.9, hematocrit 45.9, platelets 170,000.  BUN and creatinine 16 and 1.0 respectively.  Normal electrolytes.  Normal liver function tests.  CK 916, MB 8.9 yielding a ratio of 1.0, and troponin which is negative at 0.01.  Urinalysis is currently pending.  ASSESSMENT AND PLAN: 1. Chest pain.  Will rule out myocardial infarction with enzymes, CK, CK-MB,    and troponin I q.6h. x2.  He has one inch of nitropaste currently placed    and we will continue this as well as obtaining an EKG in the morning.  He    is to be on telemetry overnight.  Lovenox has been held due to the point    that I believe his pain is most likely musculoskeletal given his elevated    CK without other cardiac indices being elevated and the tenderness into his    biceps tendon.  However, he does indicate a reasonably good story for the    worst chest pain that he has ever had relieved by nitroglycerin and worse    specifically on exertion with walking.  Further cardiac testing as far as    treadmill test or Cardiolite will be considered in the morning.  We will    also add a protime pump inhibitor as possibility of gastroesophageal reflux    disease causing this pain is possible as well. 2. Hypogonadism secondary to Klinefelters.  Will continue with androgel while    in-house. 3. Recent dysuria.  His urinalysis and culture are pending. Dictated by:   Gaspar Garbe, M.D. Attending Physician:  Julian Hy DD:  04/25/01 TD:  04/26/01 Job: [id number]

## 2012-09-21 ENCOUNTER — Emergency Department (HOSPITAL_COMMUNITY): Payer: Worker's Compensation

## 2012-09-21 ENCOUNTER — Encounter (HOSPITAL_COMMUNITY): Payer: Self-pay | Admitting: Emergency Medicine

## 2012-09-21 ENCOUNTER — Inpatient Hospital Stay (HOSPITAL_COMMUNITY)
Admission: EM | Admit: 2012-09-21 | Discharge: 2012-09-23 | DRG: 087 | Disposition: A | Discharge status: Home / Self Care | Payer: Worker's Compensation | Attending: Surgery | Admitting: Surgery

## 2012-09-21 ENCOUNTER — Inpatient Hospital Stay (HOSPITAL_COMMUNITY): Payer: Worker's Compensation

## 2012-09-21 DIAGNOSIS — IMO0001 Reserved for inherently not codable concepts without codable children: Secondary | ICD-10-CM | POA: Diagnosis present

## 2012-09-21 DIAGNOSIS — Y999 Unspecified external cause status: Secondary | ICD-10-CM

## 2012-09-21 DIAGNOSIS — S066X9A Traumatic subarachnoid hemorrhage with loss of consciousness of unspecified duration, initial encounter: Secondary | ICD-10-CM

## 2012-09-21 DIAGNOSIS — S062XAA Diffuse traumatic brain injury with loss of consciousness status unknown, initial encounter: Secondary | ICD-10-CM

## 2012-09-21 DIAGNOSIS — S060X9A Concussion with loss of consciousness of unspecified duration, initial encounter: Secondary | ICD-10-CM

## 2012-09-21 DIAGNOSIS — W11XXXA Fall on and from ladder, initial encounter: Secondary | ICD-10-CM | POA: Diagnosis present

## 2012-09-21 DIAGNOSIS — R51 Headache: Secondary | ICD-10-CM | POA: Diagnosis present

## 2012-09-21 DIAGNOSIS — Z9119 Patient's noncompliance with other medical treatment and regimen: Secondary | ICD-10-CM

## 2012-09-21 DIAGNOSIS — S062X9A Diffuse traumatic brain injury with loss of consciousness of unspecified duration, initial encounter: Secondary | ICD-10-CM

## 2012-09-21 DIAGNOSIS — K219 Gastro-esophageal reflux disease without esophagitis: Secondary | ICD-10-CM | POA: Diagnosis present

## 2012-09-21 DIAGNOSIS — Z91199 Patient's noncompliance with other medical treatment and regimen due to unspecified reason: Secondary | ICD-10-CM

## 2012-09-21 DIAGNOSIS — E119 Type 2 diabetes mellitus without complications: Secondary | ICD-10-CM

## 2012-09-21 DIAGNOSIS — S066X0A Traumatic subarachnoid hemorrhage without loss of consciousness, initial encounter: Principal | ICD-10-CM | POA: Diagnosis present

## 2012-09-21 DIAGNOSIS — E291 Testicular hypofunction: Secondary | ICD-10-CM | POA: Diagnosis present

## 2012-09-21 DIAGNOSIS — J45909 Unspecified asthma, uncomplicated: Secondary | ICD-10-CM | POA: Diagnosis present

## 2012-09-21 DIAGNOSIS — E1169 Type 2 diabetes mellitus with other specified complication: Secondary | ICD-10-CM | POA: Diagnosis present

## 2012-09-21 DIAGNOSIS — S06310A Contusion and laceration of right cerebrum without loss of consciousness, initial encounter: Secondary | ICD-10-CM

## 2012-09-21 DIAGNOSIS — I609 Nontraumatic subarachnoid hemorrhage, unspecified: Secondary | ICD-10-CM

## 2012-09-21 DIAGNOSIS — Q984 Klinefelter syndrome, unspecified: Secondary | ICD-10-CM

## 2012-09-21 DIAGNOSIS — Y929 Unspecified place or not applicable: Secondary | ICD-10-CM

## 2012-09-21 HISTORY — DX: Type 2 diabetes mellitus without complications: E11.9

## 2012-09-21 HISTORY — DX: Allergy, unspecified, initial encounter: T78.40XA

## 2012-09-21 LAB — BASIC METABOLIC PANEL
CO2: 25 mEq/L (ref 19–32)
Chloride: 98 mEq/L (ref 96–112)
Sodium: 134 mEq/L — ABNORMAL LOW (ref 135–145)

## 2012-09-21 LAB — GLUCOSE, CAPILLARY
Glucose-Capillary: 377 mg/dL — ABNORMAL HIGH (ref 70–99)
Glucose-Capillary: 303 mg/dL — ABNORMAL HIGH (ref 70–99)
Glucose-Capillary: 239 mg/dL — ABNORMAL HIGH (ref 70–99)

## 2012-09-21 LAB — MRSA PCR SCREENING: MRSA by PCR: NEGATIVE

## 2012-09-21 LAB — CBC
HCT: 40.3 % (ref 39.0–52.0)
Hemoglobin: 14.1 g/dL (ref 13.0–17.0)
RDW: 11.9 % (ref 11.5–15.5)

## 2012-09-21 LAB — URINALYSIS, ROUTINE W REFLEX MICROSCOPIC
Bilirubin Urine: NEGATIVE
Glucose, UA: >1000 mg/dL — AB
Hgb urine dipstick: NEGATIVE
Specific Gravity, Urine: 1.041 — ABNORMAL HIGH (ref 1.005–1.030)

## 2012-09-21 LAB — URINE MICROSCOPIC-ADD ON

## 2012-09-21 LAB — HEMOGLOBIN A1C: Hgb A1c MFr Bld: 10.6 % — ABNORMAL HIGH (ref <5.7)

## 2012-09-21 LAB — PROTIME-INR
INR: 0.97 (ref 0.00–1.49)
Prothrombin Time: 12.8 seconds (ref 11.6–15.2)

## 2012-09-21 MED ORDER — OXYCODONE HCL 5 MG PO TABS: 5.0000 mg | ORAL_TABLET | ORAL | Status: DC | PRN

## 2012-09-21 MED ORDER — MORPHINE SULFATE 4 MG/ML IJ SOLN
4.0000 mg | Freq: Once | INTRAMUSCULAR | Status: AC
  Administered 2012-09-21: 4 mg via INTRAVENOUS
  Filled 2012-09-21: qty 1

## 2012-09-21 MED ORDER — ONDANSETRON HCL 4 MG/2ML IJ SOLN: 4.0000 mg | Freq: Four times a day (QID) | INTRAMUSCULAR | Status: DC | PRN

## 2012-09-21 MED ORDER — ONDANSETRON HCL 4 MG/2ML IJ SOLN: 4.0000 mg | Freq: Three times a day (TID) | INTRAMUSCULAR | Status: DC | PRN

## 2012-09-21 MED ORDER — SODIUM CHLORIDE 0.9 % IV SOLN: INTRAVENOUS | Status: DC

## 2012-09-21 MED ORDER — OXYCODONE-ACETAMINOPHEN 5-325 MG PO TABS
1.0000 | ORAL_TABLET | ORAL | Status: DC | PRN
  Administered 2012-09-21 – 2012-09-23 (×8): 1 via ORAL
  Filled 2012-09-21 (×8): qty 1

## 2012-09-21 MED ORDER — OXYCODONE-ACETAMINOPHEN 5-325 MG PO TABS: 1.0000 | ORAL_TABLET | ORAL | Status: DC | PRN

## 2012-09-21 MED ORDER — POTASSIUM CHLORIDE IN NACL 20-0.9 MEQ/L-% IV SOLN
INTRAVENOUS | Status: DC
  Administered 2012-09-21 – 2012-09-23 (×3): via INTRAVENOUS
  Filled 2012-09-21 (×5): qty 1000

## 2012-09-21 MED ORDER — INSULIN ASPART 100 UNIT/ML ~~LOC~~ SOLN
0.0000 [IU] | Freq: Three times a day (TID) | SUBCUTANEOUS | Status: DC
Start: 2012-09-21 — End: 2012-09-23
  Administered 2012-09-21: 5 [IU] via SUBCUTANEOUS
  Administered 2012-09-22: 3 [IU] via SUBCUTANEOUS
  Administered 2012-09-22 – 2012-09-23 (×3): 5 [IU] via SUBCUTANEOUS
  Administered 2012-09-23: 3 [IU] via SUBCUTANEOUS

## 2012-09-21 MED ORDER — ACETAMINOPHEN 325 MG PO TABS
650.0000 mg | ORAL_TABLET | Freq: Once | ORAL | Status: AC
  Administered 2012-09-21: 650 mg via ORAL
  Filled 2012-09-21: qty 2

## 2012-09-21 MED ORDER — MORPHINE SULFATE 2 MG/ML IJ SOLN: 2.0000 mg | INTRAMUSCULAR | Status: DC | PRN

## 2012-09-21 MED ORDER — ACETAMINOPHEN 325 MG PO TABS: 650.0000 mg | ORAL_TABLET | ORAL | Status: DC | PRN

## 2012-09-21 NOTE — ED Provider Notes (Signed)
Medical screening examination/treatment/procedure(s) were conducted as a shared visit with non-physician practitioner(s) and myself.  I personally evaluated the patient during the encounter  Patient presented after a fall with some concussive-like symptoms.  CT head and C-spine were completed.  C-spine was cleared radiographically.  CT had demonstrated right frontal contusion as well as small left subarachnoid hemorrhage.  We considered the possibility of acute subarachnoid hemorrhage from aneurysmal bleed as the cause of his fall but the patient thinks that he fell.  He does have headache now but denies preceding headache.  My suspicion for aneurysmal bleed as a cause of his subarachnoid hemorrhage is low. The case was discussed with Dr. Marikay Alar of neurosurgery who agrees to evaluate the patient in the hospital.  At this time we'll hold on CT angiogram of his brain.  He requested the patient be transferred to [facility].  I discussed his case with trauma surgery Dr. Lindie Spruce who agrees to accept the patient in transfer to the trauma service.  Hyperglycemia not on medications.  The patient will need to be started on metformin 500 mg twice a day.  IV fluids in the emergency department.  1. Subarachnoid hemorrhage   2. Brain contusion, right, initial encounter     CRITICAL CARE Performed by: Lyanne Co Total critical care time: 32 Critical care time was exclusive of separately billable procedures and treating other patients. Critical care was necessary to treat or prevent imminent or life-threatening deterioration. Critical care was time spent personally by me on the following activities: development of treatment plan with patient and/or surrogate as well as nursing, discussions with consultants, evaluation of patient's response to treatment, examination of patient, obtaining history from patient or surrogate, ordering and performing treatments and interventions, ordering and review of laboratory  studies, ordering and review of radiographic studies, pulse oximetry and re-evaluation of patient's condition.   I personally reviewed the imaging tests through PACS system I reviewed available ER/hospitalization records through the EMR  Dg Hip Complete Right  09/21/2012   *RADIOLOGY REPORT*  Clinical Data: 52 year old male with fall from ladder.  Right hip pain.  RIGHT HIP - COMPLETE 2+ VIEW  Comparison: None.  Findings: Bone mineralization is within normal limits.  Right hip joint space preserved.  Right proximal femur intact.  The pelvis appears intact.  Extensive degenerative spurring about the left femoral head and acetabulum appears to be chronic and may be sequelae of remote trauma and/or degeneration.  Chronic-appearing ossific fragment along the superior acetabulum on the left.  SI joints and sacral ala within normal limits.  IMPRESSION: No acute fracture or dislocation identified about the right hip or pelvis.   Original Report Authenticated By: Erskine Speed, M.D.   Ct Head Wo Contrast  09/21/2012   *RADIOLOGY REPORT*  Clinical Data:  52 year old male status post fall from ladder with loss of consciousness, pain confusion.  CT HEAD WITHOUT CONTRAST CT CERVICAL SPINE WITHOUT CONTRAST  Technique:  Multidetector CT imaging of the head and cervical spine was performed following the standard protocol without intravenous contrast.  Multiplanar CT image reconstructions of the cervical spine were also generated.  Comparison:  Cervical spine CT 08/14/2006.  CT HEAD  Findings: Visualized orbit soft tissues are within normal limits. Broad-based right posterior scalp hematoma measuring up to 7 mm in thickness.  Scalp soft tissues elsewhere within normal limits. Underlying calvarium intact. Visualized paranasal sinuses and mastoids are clear.  At the left suprasellar cistern and extending along the left M1 segment there  is intermediate increased density and indistinctness of the subarachnoid space.  Other  basilar cisterns appear normal. No intraventricular hemorrhage identified.  Partial volume artifact versus mild or early right inferior frontal lobe hemorrhagic contusion on series 3 image 13.  No mass effect or edema.  Elsewhere normal gray-white matter differentiation.  No other extra- axial hemorrhage. No evidence of cortically based acute infarction identified.  IMPRESSION: 1.  Evidence of small volume subarachnoid hemorrhage adjacent to the left ICA terminus.  This could be post traumatic.  Did the patient have any headache symptoms prior to the fall? 2.  Possible small or early right inferior frontal lobe hemorrhagic contusion. 3.  Right scalp hematoma without underlying fracture. 4.  Cervical spine findings are below.  CT CERVICAL SPINE  Findings: Stable cervical vertebral height and alignment with relatively preserved lordosis. Visualized skull base is intact.  No atlanto-occipital dissociation.  Cervicothoracic junction alignment is within normal limits.  Bilateral posterior element alignment is within normal limits.  No acute cervical fracture identified. Visualized upper thoracic levels appear grossly intact.  Negative lung apices. Visualized paraspinal soft tissues are within normal limits.  IMPRESSION: No acute fracture or listhesis identified in the cervical spine. Ligamentous injury is not excluded.  Critical Value/emergent results were called by telephone at the time of interpretation on 09/21/2012 at 0919 hours to Dr. Azalia Bilis, who verbally acknowledged these results.   Original Report Authenticated By: Erskine Speed, M.D.   Ct Cervical Spine Wo Contrast  09/21/2012   *RADIOLOGY REPORT*  Clinical Data:  52 year old male status post fall from ladder with loss of consciousness, pain confusion.  CT HEAD WITHOUT CONTRAST CT CERVICAL SPINE WITHOUT CONTRAST  Technique:  Multidetector CT imaging of the head and cervical spine was performed following the standard protocol without intravenous contrast.   Multiplanar CT image reconstructions of the cervical spine were also generated.  Comparison:  Cervical spine CT 08/14/2006.  CT HEAD  Findings: Visualized orbit soft tissues are within normal limits. Broad-based right posterior scalp hematoma measuring up to 7 mm in thickness.  Scalp soft tissues elsewhere within normal limits. Underlying calvarium intact. Visualized paranasal sinuses and mastoids are clear.  At the left suprasellar cistern and extending along the left M1 segment there is intermediate increased density and indistinctness of the subarachnoid space.  Other basilar cisterns appear normal. No intraventricular hemorrhage identified.  Partial volume artifact versus mild or early right inferior frontal lobe hemorrhagic contusion on series 3 image 13.  No mass effect or edema.  Elsewhere normal gray-white matter differentiation.  No other extra- axial hemorrhage. No evidence of cortically based acute infarction identified.  IMPRESSION: 1.  Evidence of small volume subarachnoid hemorrhage adjacent to the left ICA terminus.  This could be post traumatic.  Did the patient have any headache symptoms prior to the fall? 2.  Possible small or early right inferior frontal lobe hemorrhagic contusion. 3.  Right scalp hematoma without underlying fracture. 4.  Cervical spine findings are below.  CT CERVICAL SPINE  Findings: Stable cervical vertebral height and alignment with relatively preserved lordosis. Visualized skull base is intact.  No atlanto-occipital dissociation.  Cervicothoracic junction alignment is within normal limits.  Bilateral posterior element alignment is within normal limits.  No acute cervical fracture identified. Visualized upper thoracic levels appear grossly intact.  Negative lung apices. Visualized paraspinal soft tissues are within normal limits.  IMPRESSION: No acute fracture or listhesis identified in the cervical spine. Ligamentous injury is not excluded.  Critical Value/emergent results  were called by telephone at the time of interpretation on 09/21/2012 at 0919 hours to Dr. Azalia Bilis, who verbally acknowledged these results.   Original Report Authenticated By: Erskine Speed, M.D.    Lyanne Co, MD 09/21/12 1130

## 2012-09-21 NOTE — ED Notes (Signed)
Per pt was half way up an 8 foot ladder and thinks he slipped off step-hit right side of head-does not know if he loss consciousness-keep repeating self, c/o headache

## 2012-09-21 NOTE — Consult Note (Signed)
Reason for Consult:[facility] Referring Physician: EDP  [name] is an 52 y.o. male.   HPI:  52 year old gentleman who fell from a ladder today while painting. He only fell from about the third step. Unknown loss of consciousness but he does have some amnesia for the event. He complains of headache and some nausea but no vomiting. Denies visual changes or numbness tingling or weakness. Presented to the emergency department where head CT showed a small frontal contusion and a small amount of traumatic subarachnoid hemorrhage, and neurosurgical evaluation was recommended. He was admitted by the trauma service to the neurosurgical ICU. Complains of left knee and right hip pain. Denies neck pain.  Past Medical History  Diagnosis Date  . Diabetes mellitus without complication   . Allergy     History reviewed. No pertinent past surgical history.  No Known Allergies  History  Substance Use Topics  . Smoking status: Never Smoker   . Smokeless tobacco: Not on file  . Alcohol Use: No    Family History  Problem Relation Age of Onset  . Parkinson's disease Father   . Heart attack Maternal Grandmother      Review of Systems  Positive ROS: neg  All other systems have been reviewed and were otherwise negative with the exception of those mentioned in the HPI and as above.  Objective: Vital signs in last 24 hours: Temp:  [97.9 F (36.6 C)-98.7 F (37.1 C)] 97.9 F (36.6 C) (06/05 1551) Pulse Rate:  [68-83] 68 (06/05 1800) Resp:  [11-20] 15 (06/05 1800) BP: (108-143)/(49-79) 117/71 mmHg (06/05 1800) SpO2:  [94 %-98 %] 98 % (06/05 1800) Weight:  [106.595 kg (235 lb)] 106.595 kg (235 lb) (06/05 1101)  General Appearance: Alert, cooperative, in some discomfort, appears stated age Head: Normocephalic, without obvious abnormality Eyes: PERRL, conjunctiva/corneas clear, EOM's intact   Neck: Supple, symmetrical, trachea midline Lungs:  respirations unlabored Heart: Regular rate and  rhythm Extremities: Extremities normal, atraumatic except for small abrasion left knee, no cyanosis or edema Pulses: 2+ and symmetric all extremities Skin: Skin color, texture, turgor normal, no rashes or lesions  NEUROLOGIC:   Mental status: A&O x4, no aphasia, good attention span, Memory and fund of knowledge, some amnesia for the event Motor Exam - grossly normal, normal tone and bulk, no pronator drift Sensory Exam - grossly normal Reflexes: No Hoffman's Coordination - grossly normal Gait -not tested Balance - not tested Cranial Nerves: I: smell Not tested  II: visual acuity  OS: ok    OD: ok  II: visual fields Full to confrontation  II: pupils Equal, round, reactive to light  III,VII: ptosis None  III,IV,VI: extraocular muscles  Full ROM  V: mastication Normal  V: facial light touch sensation  Normal  V,VII: corneal reflex  Present  VII: facial muscle function - upper  Normal  VII: facial muscle function - lower Normal  VIII: hearing Not tested  IX: soft palate elevation  Normal  IX,X: gag reflex Present  XI: trapezius strength  5/5  XI: sternocleidomastoid strength 5/5  XI: neck flexion strength  5/5  XII: tongue strength  Normal    Data Review Lab Results  Component Value Date   WBC 5.3 09/21/2012   HGB 14.1 09/21/2012   HCT 40.3 09/21/2012   MCV 85.2 09/21/2012   PLT 163 09/21/2012   Lab Results  Component Value Date   NA 134* 09/21/2012   K 3.8 09/21/2012   CL 98 09/21/2012   CO2 25  09/21/2012   BUN 13 09/21/2012   CREATININE 0.82 09/21/2012   GLUCOSE 385* 09/21/2012   Lab Results  Component Value Date   INR 0.97 09/21/2012    Radiology: Dg Hip Complete Right  09/21/2012   *RADIOLOGY REPORT*  Clinical Data: 52 year old male with fall from ladder.  Right hip pain.  RIGHT HIP - COMPLETE 2+ VIEW  Comparison: None.  Findings: Bone mineralization is within normal limits.  Right hip joint space preserved.  Right proximal femur intact.  The pelvis appears intact.  Extensive  degenerative spurring about the left femoral head and acetabulum appears to be chronic and may be sequelae of remote trauma and/or degeneration.  Chronic-appearing ossific fragment along the superior acetabulum on the left.  SI joints and sacral ala within normal limits.  IMPRESSION: No acute fracture or dislocation identified about the right hip or pelvis.   Original Report Authenticated By: Erskine Speed, M.D.   Ct Head Wo Contrast  09/21/2012   *RADIOLOGY REPORT*  Clinical Data:  52 year old male status post fall from ladder with loss of consciousness, pain confusion.  CT HEAD WITHOUT CONTRAST CT CERVICAL SPINE WITHOUT CONTRAST  Technique:  Multidetector CT imaging of the head and cervical spine was performed following the standard protocol without intravenous contrast.  Multiplanar CT image reconstructions of the cervical spine were also generated.  Comparison:  Cervical spine CT 08/14/2006.  CT HEAD  Findings: Visualized orbit soft tissues are within normal limits. Broad-based right posterior scalp hematoma measuring up to 7 mm in thickness.  Scalp soft tissues elsewhere within normal limits. Underlying calvarium intact. Visualized paranasal sinuses and mastoids are clear.  At the left suprasellar cistern and extending along the left M1 segment there is intermediate increased density and indistinctness of the subarachnoid space.  Other basilar cisterns appear normal. No intraventricular hemorrhage identified.  Partial volume artifact versus mild or early right inferior frontal lobe hemorrhagic contusion on series 3 image 13.  No mass effect or edema.  Elsewhere normal gray-white matter differentiation.  No other extra- axial hemorrhage. No evidence of cortically based acute infarction identified.  IMPRESSION: 1.  Evidence of small volume subarachnoid hemorrhage adjacent to the left ICA terminus.  This could be post traumatic.  Did the patient have any headache symptoms prior to the fall? 2.  Possible small or  early right inferior frontal lobe hemorrhagic contusion. 3.  Right scalp hematoma without underlying fracture. 4.  Cervical spine findings are below.  CT CERVICAL SPINE  Findings: Stable cervical vertebral height and alignment with relatively preserved lordosis. Visualized skull base is intact.  No atlanto-occipital dissociation.  Cervicothoracic junction alignment is within normal limits.  Bilateral posterior element alignment is within normal limits.  No acute cervical fracture identified. Visualized upper thoracic levels appear grossly intact.  Negative lung apices. Visualized paraspinal soft tissues are within normal limits.  IMPRESSION: No acute fracture or listhesis identified in the cervical spine. Ligamentous injury is not excluded.  Critical Value/emergent results were called by telephone at the time of interpretation on 09/21/2012 at 0919 hours to Dr. Azalia Bilis, who verbally acknowledged these results.   Original Report Authenticated By: Erskine Speed, M.D.   Ct Cervical Spine Wo Contrast  09/21/2012   *RADIOLOGY REPORT*  Clinical Data:  52 year old male status post fall from ladder with loss of consciousness, pain confusion.  CT HEAD WITHOUT CONTRAST CT CERVICAL SPINE WITHOUT CONTRAST  Technique:  Multidetector CT imaging of the head and cervical spine was performed following the standard  protocol without intravenous contrast.  Multiplanar CT image reconstructions of the cervical spine were also generated.  Comparison:  Cervical spine CT 08/14/2006.  CT HEAD  Findings: Visualized orbit soft tissues are within normal limits. Broad-based right posterior scalp hematoma measuring up to 7 mm in thickness.  Scalp soft tissues elsewhere within normal limits. Underlying calvarium intact. Visualized paranasal sinuses and mastoids are clear.  At the left suprasellar cistern and extending along the left M1 segment there is intermediate increased density and indistinctness of the subarachnoid space.  Other basilar  cisterns appear normal. No intraventricular hemorrhage identified.  Partial volume artifact versus mild or early right inferior frontal lobe hemorrhagic contusion on series 3 image 13.  No mass effect or edema.  Elsewhere normal gray-white matter differentiation.  No other extra- axial hemorrhage. No evidence of cortically based acute infarction identified.  IMPRESSION: 1.  Evidence of small volume subarachnoid hemorrhage adjacent to the left ICA terminus.  This could be post traumatic.  Did the patient have any headache symptoms prior to the fall? 2.  Possible small or early right inferior frontal lobe hemorrhagic contusion. 3.  Right scalp hematoma without underlying fracture. 4.  Cervical spine findings are below.  CT CERVICAL SPINE  Findings: Stable cervical vertebral height and alignment with relatively preserved lordosis. Visualized skull base is intact.  No atlanto-occipital dissociation.  Cervicothoracic junction alignment is within normal limits.  Bilateral posterior element alignment is within normal limits.  No acute cervical fracture identified. Visualized upper thoracic levels appear grossly intact.  Negative lung apices. Visualized paraspinal soft tissues are within normal limits.  IMPRESSION: No acute fracture or listhesis identified in the cervical spine. Ligamentous injury is not excluded.  Critical Value/emergent results were called by telephone at the time of interpretation on 09/21/2012 at 0919 hours to Dr. Azalia Bilis, who verbally acknowledged these results.   Original Report Authenticated By: Erskine Speed, M.D.   Dg Chest Portable 1 View  09/21/2012   *RADIOLOGY REPORT*  Clinical Data: Fall  PORTABLE CHEST - 1 VIEW  Comparison: 03/16/2010  Findings: Cardiomediastinal silhouette is stable.  No acute infiltrate or pleural effusion.  No pulmonary edema.  Bony thorax is stable.  IMPRESSION: No active disease.  No significant change.   Original Report Authenticated By: Natasha Mead, M.D.   CT  scan: As above  Assessment/Plan: 52 year old gentleman with a closed head injury after a fall. We'll repeat his head CT in the morning. Nothing that requires any immediate intervention. I explained to him that he'll likely be postconcussive for some time. I will follow along.   Joda Braatz S 09/21/2012 7:00 PM

## 2012-09-21 NOTE — ED Notes (Signed)
Per pt's coworkers, they don't know how long he "was out", states he just keep asking same questions over and over-

## 2012-09-21 NOTE — ED Notes (Signed)
[facility] called  

## 2012-09-21 NOTE — Progress Notes (Signed)
[facility] has seen patient and provided him with a list of primary care resources. °

## 2012-09-21 NOTE — ED Notes (Signed)
Off floor for testing 

## 2012-09-21 NOTE — ED Provider Notes (Signed)
History     CSN: [id number]  Arrival date & time 09/21/12  0805   First MD Initiated Contact with Patient 09/21/12 [phone]      Chief Complaint  Patient presents with  . Fall    (Consider location/radiation/quality/duration/timing/severity/associated sxs/prior treatment) HPI Pt is a 52yo male presented after fall from ladder 1 hour ago.  Pt is mentally altered, continuously repeating himself however some hx was able to be obtained.  Pt believes he was halfway up 85ft ladder when he slipped, fell, and hit right side of head.  States he does not know if there was LOC.  Coworkers were present at the time but in other rooms, did not witness the fall.  Pt c/o sudden onset right sided aching headache.  Pt also c/o abrasion to right thumb.  Pt is diabetic but does not recall normal CBG. States he takes aspirin but cannot recall other medications, simply states he knows he needs to take diabetic meds.  Denies cardiopulmonary disease.     Past Medical History  Diagnosis Date  . Diabetes mellitus without complication     No past surgical history on file.  No family history on file.  History  Substance Use Topics  . Smoking status: Never Smoker   . Smokeless tobacco: Not on file  . Alcohol Use: No      Review of Systems  Unable to perform ROS: Mental status change    Allergies  Review of patient's allergies indicates no known allergies.  Home Medications   Current Outpatient Rx  Name  Route  Sig  Dispense  Refill  . metFORMIN (GLUCOPHAGE-XR) 500 MG 24 hr tablet   Oral   Take 1,000 mg by mouth 2 (two) times daily.         . Testosterone (FORTESTA) 10 MG/ACT (2%) GEL   Transdermal   Place 1 application onto the skin 4 (four) times daily.           BP 127/77  Pulse 78  Temp(Src) 98.7 F (37.1 C) (Oral)  Resp 17  Wt 235 lb (106.595 kg)  SpO2 97%  Physical Exam  Nursing note and vitals reviewed. Constitutional: He appears well-developed and well-nourished. No  distress.  Pt lying in exam bed with c-collar on.  NAD. Occasionally repeating question "what happened?"  HENT:  Head: Normocephalic. Head is with contusion ( right parietal region, TTP). Head is without raccoon's eyes, without Battle's sign, without abrasion, without laceration, without right periorbital erythema and without left periorbital erythema. Hair is normal.  Right Ear: Hearing, tympanic membrane, external ear and ear canal normal.  Left Ear: Hearing, tympanic membrane, external ear and ear canal normal.  Nose: Nose normal.  Mouth/Throat: Uvula is midline, oropharynx is clear and moist and mucous membranes are normal. No oropharyngeal exudate.  Eyes: Conjunctivae and EOM are normal. Pupils are equal, round, and reactive to light. Right eye exhibits no discharge. Left eye exhibits no discharge. No scleral icterus.  Neck: Normal range of motion. Neck supple.  No midline bony tenderness, no tenderness of paraspinal muscles. FROM w/o pain.   Cardiovascular: Normal rate, regular rhythm and normal heart sounds.   Pulmonary/Chest: Effort normal and breath sounds normal. No respiratory distress. He has no wheezes. He has no rales. He exhibits no tenderness.  Abdominal: Soft. Bowel sounds are normal. He exhibits no distension and no mass. There is no tenderness. There is no rebound and no guarding.  Musculoskeletal: Normal range of motion. He exhibits tenderness ( right  parietal bony tenderness).  Neurological: He is alert. He has normal strength. No cranial nerve deficit or sensory deficit. Coordination normal. GCS eye subscore is 4. GCS verbal subscore is 4. GCS motor subscore is 6.  CN II-XII in tact, no focal deficit.  GCS 14, pt has slight confusion.  Repeats "what happened?"  No able to give good history of what happened.  Slow to respond to questions.    Skin: Skin is warm and dry. He is not diaphoretic.  2cm abrasion to MCP of right thumb    ED Course  Procedures (including critical  care time)  Labs Reviewed  GLUCOSE, CAPILLARY - Abnormal; Notable for the following:    Glucose-Capillary 377 (*)    All other components within normal limits  BASIC METABOLIC PANEL - Abnormal; Notable for the following:    Sodium 134 (*)    Glucose, Bld 385 (*)    All other components within normal limits  CBC  PROTIME-INR   Dg Hip Complete Right  09/21/2012   *RADIOLOGY REPORT*  Clinical Data: 52 year old male with fall from ladder.  Right hip pain.  RIGHT HIP - COMPLETE 2+ VIEW  Comparison: None.  Findings: Bone mineralization is within normal limits.  Right hip joint space preserved.  Right proximal femur intact.  The pelvis appears intact.  Extensive degenerative spurring about the left femoral head and acetabulum appears to be chronic and may be sequelae of remote trauma and/or degeneration.  Chronic-appearing ossific fragment along the superior acetabulum on the left.  SI joints and sacral ala within normal limits.  IMPRESSION: No acute fracture or dislocation identified about the right hip or pelvis.   Original Report Authenticated By: Erskine Speed, M.D.   Ct Head Wo Contrast  09/21/2012   *RADIOLOGY REPORT*  Clinical Data:  52 year old male status post fall from ladder with loss of consciousness, pain confusion.  CT HEAD WITHOUT CONTRAST CT CERVICAL SPINE WITHOUT CONTRAST  Technique:  Multidetector CT imaging of the head and cervical spine was performed following the standard protocol without intravenous contrast.  Multiplanar CT image reconstructions of the cervical spine were also generated.  Comparison:  Cervical spine CT 08/14/2006.  CT HEAD  Findings: Visualized orbit soft tissues are within normal limits. Broad-based right posterior scalp hematoma measuring up to 7 mm in thickness.  Scalp soft tissues elsewhere within normal limits. Underlying calvarium intact. Visualized paranasal sinuses and mastoids are clear.  At the left suprasellar cistern and extending along the left M1 segment  there is intermediate increased density and indistinctness of the subarachnoid space.  Other basilar cisterns appear normal. No intraventricular hemorrhage identified.  Partial volume artifact versus mild or early right inferior frontal lobe hemorrhagic contusion on series 3 image 13.  No mass effect or edema.  Elsewhere normal gray-white matter differentiation.  No other extra- axial hemorrhage. No evidence of cortically based acute infarction identified.  IMPRESSION: 1.  Evidence of small volume subarachnoid hemorrhage adjacent to the left ICA terminus.  This could be post traumatic.  Did the patient have any headache symptoms prior to the fall? 2.  Possible small or early right inferior frontal lobe hemorrhagic contusion. 3.  Right scalp hematoma without underlying fracture. 4.  Cervical spine findings are below.  CT CERVICAL SPINE  Findings: Stable cervical vertebral height and alignment with relatively preserved lordosis. Visualized skull base is intact.  No atlanto-occipital dissociation.  Cervicothoracic junction alignment is within normal limits.  Bilateral posterior element alignment is within normal limits.  No acute cervical fracture identified. Visualized upper thoracic levels appear grossly intact.  Negative lung apices. Visualized paraspinal soft tissues are within normal limits.  IMPRESSION: No acute fracture or listhesis identified in the cervical spine. Ligamentous injury is not excluded.  Critical Value/emergent results were called by telephone at the time of interpretation on 09/21/2012 at 0919 hours to Dr. Azalia Bilis, who verbally acknowledged these results.   Original Report Authenticated By: Erskine Speed, M.D.   Ct Cervical Spine Wo Contrast  09/21/2012   *RADIOLOGY REPORT*  Clinical Data:  52 year old male status post fall from ladder with loss of consciousness, pain confusion.  CT HEAD WITHOUT CONTRAST CT CERVICAL SPINE WITHOUT CONTRAST  Technique:  Multidetector CT imaging of the head and  cervical spine was performed following the standard protocol without intravenous contrast.  Multiplanar CT image reconstructions of the cervical spine were also generated.  Comparison:  Cervical spine CT 08/14/2006.  CT HEAD  Findings: Visualized orbit soft tissues are within normal limits. Broad-based right posterior scalp hematoma measuring up to 7 mm in thickness.  Scalp soft tissues elsewhere within normal limits. Underlying calvarium intact. Visualized paranasal sinuses and mastoids are clear.  At the left suprasellar cistern and extending along the left M1 segment there is intermediate increased density and indistinctness of the subarachnoid space.  Other basilar cisterns appear normal. No intraventricular hemorrhage identified.  Partial volume artifact versus mild or early right inferior frontal lobe hemorrhagic contusion on series 3 image 13.  No mass effect or edema.  Elsewhere normal gray-white matter differentiation.  No other extra- axial hemorrhage. No evidence of cortically based acute infarction identified.  IMPRESSION: 1.  Evidence of small volume subarachnoid hemorrhage adjacent to the left ICA terminus.  This could be post traumatic.  Did the patient have any headache symptoms prior to the fall? 2.  Possible small or early right inferior frontal lobe hemorrhagic contusion. 3.  Right scalp hematoma without underlying fracture. 4.  Cervical spine findings are below.  CT CERVICAL SPINE  Findings: Stable cervical vertebral height and alignment with relatively preserved lordosis. Visualized skull base is intact.  No atlanto-occipital dissociation.  Cervicothoracic junction alignment is within normal limits.  Bilateral posterior element alignment is within normal limits.  No acute cervical fracture identified. Visualized upper thoracic levels appear grossly intact.  Negative lung apices. Visualized paraspinal soft tissues are within normal limits.  IMPRESSION: No acute fracture or listhesis identified in  the cervical spine. Ligamentous injury is not excluded.  Critical Value/emergent results were called by telephone at the time of interpretation on 09/21/2012 at 0919 hours to Dr. Azalia Bilis, who verbally acknowledged these results.   Original Report Authenticated By: Erskine Speed, M.D.     1. Subarachnoid hemorrhage   2. Brain contusion, right, initial encounter       MDM  Concern for intracranial hemorrhage.  Reason for fall, unknown.  Will perform medical and trauma workup.  Labs: CBC, BMP, PT/INR, Glucose.  Imaging: CT head and CT c-spine. EKG.    Head CT: evidence of small volume subarachnoid hemorrhage, possible small or early right inferior frontal lobe hemorrhagic contusion. Right scalp hematoma w/o underlying fx.   CT c-spine: no acute fx or listhesis identified in c-spine. Ligamentous injury is not excluded.   Upon reexamination of pt, pt c/o right hip pain. Mild TTP. Hip is stable. Ordered plain films right hip.  Low suspicion for fx.    Dr. Patria Mane consulted neurosurgery, Dr. Yetta Barre.  Unknown if [facility]  was aneurysm prior to fall or due to head trauma. Dr. Patria Mane also consulted trauma team  Pt is going to be admitted to [address] unit at [facility], admitted by trauma MD       Junius Finner, PA-C 09/21/12 1114

## 2012-09-21 NOTE — ED Notes (Signed)
Attempted to call report

## 2012-09-21 NOTE — ED Provider Notes (Signed)
Date: 09/21/2012  Rate: 81   Rhythm: normal sinus rhythm  QRS Axis: normal  Intervals: normal  ST/T Wave abnormalities: normal  Conduction Disutrbances: none  Narrative Interpretation:   Old EKG Reviewed: No significant changes noted     Lyanne Co, MD 09/21/12 1139

## 2012-09-21 NOTE — H&P (Signed)
Patient is a bit concussed.  Needs admission and repeat head CT.  [name]. Gae Bon, MD, FACS [phone] Trauma Surgeon

## 2012-09-21 NOTE — H&P (Signed)
Chief Complaint: headache, subarachnoid hemorrhage HPI: Rook Maue is a 52 year old [place] male with a history of diabetes mellitus and hypogonadism who presented to [facility] ED via private vehicle after falling off of a latter.  The patient states at around 7:45 am he was painting on a 6 foot ladder, lost his balance and fell on the right side of the head.  His friend and pastor, Lorin Picket, witnessed the fall.  Lorin Picket states that after the fall, he was very "dazed and confused."  After not being able to sit him up her called an employee and brought him to the ED.  The patient recalls waking up in a truck with a headache and nausea.  The nausea resolved, but the headache persisted.  Location is right temporal lobe.  Associating factors include; photophobia.  No aggravating or alleviating factors.  Characterized as throbbing pain without radiation.  Time pattern is constant.  He was given morphine, but did not help alleviate the pain.  He denies loss of bowel or bladder control.  Denies vision changes or loss.  Denies Vomiting.  Denies weakness or paresthesias.  He does not take any blood thinners.    Off note, he is a diabetic.  He recently acquired health insurance but has not seen his pcp at [facility].  He has not been taking his metformin.    Past Medical History  Diagnosis Date  . Diabetes mellitus without complication   . Allergy     History reviewed. No pertinent past surgical history.  Family History  Problem Relation Age of Onset  . Parkinson's disease Father   . Heart attack Maternal Grandmother    Social History:  reports that he has never smoked. He does not have any smokeless tobacco history on file. He reports that he does not drink alcohol or use illicit drugs.  He is in the process of going through a divorce. He has an estranged wife and 67 year old adopted daughter. His father is ill and lives about 1 hour from [place]. The patient lives in an apartment by himself  on the [place] [address]. 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 His friend and pastor Carlyle Basques is the first and only contact.  Allergies: No Known Allergies  Medications Prior to Admission  Medication Sig Dispense Refill  . metFORMIN (GLUCOPHAGE-XR) 500 MG 24 hr tablet Take 1,000 mg by mouth 2 (two) times daily.      . Testosterone (FORTESTA) 10 MG/ACT (2%) GEL Place 1 application onto the skin 4 (four) times daily.        Results for orders placed during the hospital encounter of 09/21/12 (from the past 48 hour(s))  GLUCOSE, CAPILLARY     Status: Abnormal   Collection Time    09/21/12  8:19 AM      Result Value Range   Glucose-Capillary 377 (*) 70 - 99 mg/dL   Comment 1 Documented in Chart     Comment 2 Notify RN    CBC     Status: None   Collection Time    09/21/12  8:49 AM      Result Value Range   WBC 5.3  4.0 - 10.5 K/uL   RBC 4.73  4.22 - 5.81 MIL/uL   Hemoglobin 14.1  13.0 - 17.0 g/dL   HCT 09.8  11.9 - 14.7 %   MCV 85.2  78.0 - 100.0 fL   MCH 29.8  26.0 - 34.0 pg   MCHC  35.0  30.0 - 36.0 g/dL   RDW 16.1  09.6 - 04.5 %   Platelets 163  150 - 400 K/uL  BASIC METABOLIC PANEL     Status: Abnormal   Collection Time    09/21/12  8:49 AM      Result Value Range   Sodium 134 (*) 135 - 145 mEq/L   Potassium 3.8  3.5 - 5.1 mEq/L   Chloride 98  96 - 112 mEq/L   CO2 25  19 - 32 mEq/L   Glucose, Bld 385 (*) 70 - 99 mg/dL   BUN 13  6 - 23 mg/dL   Creatinine, Ser 4.09  0.50 - 1.35 mg/dL   Calcium 9.6  8.4 - 81.1 mg/dL   GFR calc non Af Amer >90  >90 mL/min   GFR calc Af Amer >90  >90 mL/min   Comment:            The eGFR has been calculated     using the CKD EPI equation.     This calculation has not been     validated in all clinical     situations.     eGFR's persistently     <90 mL/min signify     possible Chronic Kidney Disease.  PROTIME-INR     Status: None   Collection Time    09/21/12  8:49 AM      Result Value Range   Prothrombin Time 12.8   11.6 - 15.2 seconds   INR 0.97  0.00 - 1.49   Dg Hip Complete Right  09/21/2012   *RADIOLOGY REPORT*  Clinical Data: 52 year old male with fall from ladder.  Right hip pain.  RIGHT HIP - COMPLETE 2+ VIEW  Comparison: None.  Findings: Bone mineralization is within normal limits.  Right hip joint space preserved.  Right proximal femur intact.  The pelvis appears intact.  Extensive degenerative spurring about the left femoral head and acetabulum appears to be chronic and may be sequelae of remote trauma and/or degeneration.  Chronic-appearing ossific fragment along the superior acetabulum on the left.  SI joints and sacral ala within normal limits.  IMPRESSION: No acute fracture or dislocation identified about the right hip or pelvis.   Original Report Authenticated By: Erskine Speed, M.D.   Ct Head Wo Contrast  09/21/2012   *RADIOLOGY REPORT*  Clinical Data:  52 year old male status post fall from ladder with loss of consciousness, pain confusion.  CT HEAD WITHOUT CONTRAST CT CERVICAL SPINE WITHOUT CONTRAST  Technique:  Multidetector CT imaging of the head and cervical spine was performed following the standard protocol without intravenous contrast.  Multiplanar CT image reconstructions of the cervical spine were also generated.  Comparison:  Cervical spine CT 08/14/2006.  CT HEAD  Findings: Visualized orbit soft tissues are within normal limits. Broad-based right posterior scalp hematoma measuring up to 7 mm in thickness.  Scalp soft tissues elsewhere within normal limits. Underlying calvarium intact. Visualized paranasal sinuses and mastoids are clear.  At the left suprasellar cistern and extending along the left M1 segment there is intermediate increased density and indistinctness of the subarachnoid space.  Other basilar cisterns appear normal. No intraventricular hemorrhage identified.  Partial volume artifact versus mild or early right inferior frontal lobe hemorrhagic contusion on series 3 image 13.  No mass  effect or edema.  Elsewhere normal gray-white matter differentiation.  No other extra- axial hemorrhage. No evidence of cortically based acute infarction identified.  IMPRESSION: 1.  Evidence of  small volume subarachnoid hemorrhage adjacent to the left ICA terminus.  This could be post traumatic.  Did the patient have any headache symptoms prior to the fall? 2.  Possible small or early right inferior frontal lobe hemorrhagic contusion. 3.  Right scalp hematoma without underlying fracture. 4.  Cervical spine findings are below.  CT CERVICAL SPINE  Findings: Stable cervical vertebral height and alignment with relatively preserved lordosis. Visualized skull base is intact.  No atlanto-occipital dissociation.  Cervicothoracic junction alignment is within normal limits.  Bilateral posterior element alignment is within normal limits.  No acute cervical fracture identified. Visualized upper thoracic levels appear grossly intact.  Negative lung apices. Visualized paraspinal soft tissues are within normal limits.  IMPRESSION: No acute fracture or listhesis identified in the cervical spine. Ligamentous injury is not excluded.  Critical Value/emergent results were called by telephone at the time of interpretation on 09/21/2012 at 0919 hours to Dr. Azalia Bilis, who verbally acknowledged these results.   Original Report Authenticated By: Erskine Speed, M.D.   Ct Cervical Spine Wo Contrast  09/21/2012   *RADIOLOGY REPORT*  Clinical Data:  52 year old male status post fall from ladder with loss of consciousness, pain confusion.  CT HEAD WITHOUT CONTRAST CT CERVICAL SPINE WITHOUT CONTRAST  Technique:  Multidetector CT imaging of the head and cervical spine was performed following the standard protocol without intravenous contrast.  Multiplanar CT image reconstructions of the cervical spine were also generated.  Comparison:  Cervical spine CT 08/14/2006.  CT HEAD  Findings: Visualized orbit soft tissues are within normal limits.  Broad-based right posterior scalp hematoma measuring up to 7 mm in thickness.  Scalp soft tissues elsewhere within normal limits. Underlying calvarium intact. Visualized paranasal sinuses and mastoids are clear.  At the left suprasellar cistern and extending along the left M1 segment there is intermediate increased density and indistinctness of the subarachnoid space.  Other basilar cisterns appear normal. No intraventricular hemorrhage identified.  Partial volume artifact versus mild or early right inferior frontal lobe hemorrhagic contusion on series 3 image 13.  No mass effect or edema.  Elsewhere normal gray-white matter differentiation.  No other extra- axial hemorrhage. No evidence of cortically based acute infarction identified.  IMPRESSION: 1.  Evidence of small volume subarachnoid hemorrhage adjacent to the left ICA terminus.  This could be post traumatic.  Did the patient have any headache symptoms prior to the fall? 2.  Possible small or early right inferior frontal lobe hemorrhagic contusion. 3.  Right scalp hematoma without underlying fracture. 4.  Cervical spine findings are below.  CT CERVICAL SPINE  Findings: Stable cervical vertebral height and alignment with relatively preserved lordosis. Visualized skull base is intact.  No atlanto-occipital dissociation.  Cervicothoracic junction alignment is within normal limits.  Bilateral posterior element alignment is within normal limits.  No acute cervical fracture identified. Visualized upper thoracic levels appear grossly intact.  Negative lung apices. Visualized paraspinal soft tissues are within normal limits.  IMPRESSION: No acute fracture or listhesis identified in the cervical spine. Ligamentous injury is not excluded.  Critical Value/emergent results were called by telephone at the time of interpretation on 09/21/2012 at 0919 hours to Dr. Azalia Bilis, who verbally acknowledged these results.   Original Report Authenticated By: Erskine Speed, M.D.    Dg Chest Portable 1 View  09/21/2012   *RADIOLOGY REPORT*  Clinical Data: Fall  PORTABLE CHEST - 1 VIEW  Comparison: 03/16/2010  Findings: Cardiomediastinal silhouette is stable.  No acute infiltrate or pleural  effusion.  No pulmonary edema.  Bony thorax is stable.  IMPRESSION: No active disease.  No significant change.   Original Report Authenticated By: Natasha Mead, M.D.    Review of Systems  Constitutional: Negative for fever, chills, weight loss and diaphoresis.  HENT: Negative for hearing loss, ear pain, nosebleeds, tinnitus and ear discharge.   Eyes: Positive for photophobia. Negative for blurred vision, double vision, pain, discharge and redness.  Respiratory: Negative for cough, shortness of breath and wheezing.   Cardiovascular: Negative for chest pain, palpitations and leg swelling.  Gastrointestinal: Positive for nausea. Negative for vomiting, abdominal pain, diarrhea, constipation, blood in stool and melena.  Genitourinary: Positive for frequency. Negative for hematuria.  Neurological: Positive for loss of consciousness and headaches. Negative for dizziness, tingling, sensory change, speech change, focal weakness, seizures and weakness.  Endo/Heme/Allergies: Positive for polydipsia.    Blood pressure 135/49, pulse 82, temperature 98.7 F (37.1 C), temperature source Oral, resp. rate 12, weight 235 lb (106.595 kg), SpO2 97.00%. Physical Exam  Constitutional: He is oriented to person, place, and time. He appears well-developed and well-nourished. No distress.  HENT:  Head: Normocephalic.  Mouth/Throat: No oropharyngeal exudate.  Right head contusion  Eyes: Conjunctivae and EOM are normal. Pupils are equal, round, and reactive to light. Right eye exhibits no discharge. Left eye exhibits no discharge. No scleral icterus.  Neck: Normal range of motion. Neck supple.  Cardiovascular: Normal rate, regular rhythm, normal heart sounds and intact distal pulses.  Exam reveals no gallop and  no friction rub.   No murmur heard. Respiratory: Effort normal and breath sounds normal. No respiratory distress. He has no wheezes. He has no rales. He exhibits no tenderness.  GI: Soft. Bowel sounds are normal. He exhibits no distension and no mass. There is no tenderness. There is no rebound and no guarding.  Musculoskeletal: Normal range of motion. He exhibits no edema and no tenderness.  Lymphadenopathy:    He has no cervical adenopathy.  Neurological: He is alert and oriented to person, place, and time. He has normal reflexes. He displays normal reflexes. No cranial nerve deficit. He exhibits normal muscle tone. Coordination normal.  Skin: Skin is warm and dry. No rash noted. He is not diaphoretic. No erythema. No pallor.  Psychiatric: His behavior is normal. Judgment and thought content normal.  Flat affect, does not make eye contact     Assessment/Plan Fall Brain Contusion SDH -He is neurologically stable -Monitor VS, neuro checks -ICU  -IVF -Clear liquid diet and advance as tolerated -pain control -Dr. Yetta Barre following, appreciate consult -Repeat CT scan in AM -repeat cbc and bmp in AM  Diabetes mellitus -Likely high a1c, non compliant with medication -QID monitoring, sliding scale -obtain A1C -clarify metformin dose and resume since he has not received any contrast.  VTE prophylaxis -no antithrombotic therapy due to principal problem -SCDs and ambulate  **the patient expressed he does not want any information given to wife, care order written.Bonner Puna, [facility] ANP-BC Pager [phone]  09/21/2012, 1:17 PM 1:38 PM

## 2012-09-22 ENCOUNTER — Inpatient Hospital Stay (HOSPITAL_COMMUNITY): Payer: Worker's Compensation

## 2012-09-22 ENCOUNTER — Encounter (HOSPITAL_COMMUNITY): Payer: Self-pay | Admitting: Radiology

## 2012-09-22 LAB — CBC
HCT: 38.7 % — ABNORMAL LOW (ref 39.0–52.0)
Hemoglobin: 13.2 g/dL (ref 13.0–17.0)
MCV: 87 fL (ref 78.0–100.0)
RBC: 4.45 MIL/uL (ref 4.22–5.81)
RDW: 12.2 % (ref 11.5–15.5)
WBC: 5.6 10*3/uL (ref 4.0–10.5)

## 2012-09-22 LAB — BASIC METABOLIC PANEL
BUN: 12 mg/dL (ref 6–23)
CO2: 27 mEq/L (ref 19–32)
Chloride: 101 mEq/L (ref 96–112)
Creatinine, Ser: 0.79 mg/dL (ref 0.50–1.35)
GFR calc Af Amer: >90 mL/min (ref >90)
Glucose, Bld: 261 mg/dL — ABNORMAL HIGH (ref 70–99)
Potassium: 3.8 mEq/L (ref 3.5–5.1)

## 2012-09-22 LAB — GLUCOSE, CAPILLARY
Glucose-Capillary: 233 mg/dL — ABNORMAL HIGH (ref 70–99)
Glucose-Capillary: 189 mg/dL — ABNORMAL HIGH (ref 70–99)

## 2012-09-22 MED ORDER — BD GETTING STARTED TAKE HOME KIT: 3/10ML X 30G SYRINGES
1.0000 | Freq: Once | Status: DC
  Filled 2012-09-22: qty 1

## 2012-09-22 MED ORDER — LIVING WELL WITH DIABETES BOOK
Freq: Once | Status: AC
  Administered 2012-09-22: 15:00:00
  Filled 2012-09-22: qty 1

## 2012-09-22 MED ORDER — INSULIN GLARGINE 100 UNIT/ML ~~LOC~~ SOLN
10.0000 [IU] | Freq: Every day | SUBCUTANEOUS | Status: DC
  Administered 2012-09-22 – 2012-09-23 (×2): 10 [IU] via SUBCUTANEOUS
  Filled 2012-09-22 (×2): qty 0.1

## 2012-09-22 NOTE — Progress Notes (Signed)
Patient ID: [name], male   DOB: 10/11/1960, 52 y.o.   MRN: [id number]  LOS: 1 day   Subjective: Pt sitting up, eating breakfast.  Still having a headache.  Denies visual disturbances, weakness or paresthesias.  Objective: Vital signs in last 24 hours: Temp:  [97.5 F (36.4 C)-98.1 F (36.7 C)] 97.5 F (36.4 C) (06/06 0400) Pulse Rate:  [65-82] 67 (06/06 0700) Resp:  [10-17] 16 (06/06 0700) BP: (108-136)/(49-85) 114/70 mmHg (06/06 0700) SpO2:  [91 %-98 %] 95 % (06/06 0700) Weight:  [235 lb (106.595 kg)] 235 lb (106.595 kg) (06/05 1101)    Lab Results:  CBC  Recent Labs  09/21/12 0849 09/22/12 0600  WBC 5.3 5.6  HGB 14.1 13.2  HCT 40.3 38.7*  PLT 163 144*   BMET  Recent Labs  09/21/12 0849 09/22/12 0600  NA 134* 136  K 3.8 3.8  CL 98 101  CO2 25 27  GLUCOSE 385* 261*  BUN 13 12  CREATININE 0.82 0.79  CALCIUM 9.6 9.2    Imaging: Dg Hip Complete Right  09/21/2012   *RADIOLOGY REPORT*  Clinical Data: 52 year old male with fall from ladder.  Right hip pain.  RIGHT HIP - COMPLETE 2+ VIEW  Comparison: None.  Findings: Bone mineralization is within normal limits.  Right hip joint space preserved.  Right proximal femur intact.  The pelvis appears intact.  Extensive degenerative spurring about the left femoral head and acetabulum appears to be chronic and may be sequelae of remote trauma and/or degeneration.  Chronic-appearing ossific fragment along the superior acetabulum on the left.  SI joints and sacral ala within normal limits.  IMPRESSION: No acute fracture or dislocation identified about the right hip or pelvis.   Original Report Authenticated By: Erskine Speed, M.D.   Ct Head Without Contrast  09/22/2012   *RADIOLOGY REPORT* subdural hematoma.  Clinical Data: Subdural hematoma.  CT HEAD WITHOUT CONTRAST  Technique:  Contiguous axial images were obtained from the base of the skull through the vertex without contrast.  Comparison: CT head without contrast  09/21/2012.  Findings: The small focus of subarachnoid hemorrhage adjacent to the medial left temporal lobe is less conspicuous on today's exam. No new hemorrhage is evident.  There is no blood in the interpeduncular cistern or more laterally within the sylvian fissure.  The ventricles are of normal size.  No acute cortical infarct is present.  No focal mass lesion is evident.  The previous density in the inferior right frontal lobe was not the partial voluming.  There is no abnormality on today's exam in this region.  The paranasal sinuses and mastoid air cells are clear.  The osseous skull is intact.  IMPRESSION:  1.  Improving appearance of extra-axial hemorrhage adjacent to the medial left temporal lobe.   Original Report Authenticated By: Marin Roberts, M.D.   Ct Head Wo Contrast  09/21/2012   *RADIOLOGY REPORT*  Clinical Data:  53 year old male status post fall from ladder with loss of consciousness, pain confusion.  CT HEAD WITHOUT CONTRAST CT CERVICAL SPINE WITHOUT CONTRAST  Technique:  Multidetector CT imaging of the head and cervical spine was performed following the standard protocol without intravenous contrast.  Multiplanar CT image reconstructions of the cervical spine were also generated.  Comparison:  Cervical spine CT 08/14/2006.  CT HEAD  Findings: Visualized orbit soft tissues are within normal limits. Broad-based right posterior scalp hematoma measuring up to 7 mm in thickness.  Scalp soft tissues elsewhere within normal limits.  Underlying calvarium intact. Visualized paranasal sinuses and mastoids are clear.  At the left suprasellar cistern and extending along the left M1 segment there is intermediate increased density and indistinctness of the subarachnoid space.  Other basilar cisterns appear normal. No intraventricular hemorrhage identified.  Partial volume artifact versus mild or early right inferior frontal lobe hemorrhagic contusion on series 3 image 13.  No mass effect or edema.   Elsewhere normal gray-white matter differentiation.  No other extra- axial hemorrhage. No evidence of cortically based acute infarction identified.  IMPRESSION: 1.  Evidence of small volume subarachnoid hemorrhage adjacent to the left ICA terminus.  This could be post traumatic.  Did the patient have any headache symptoms prior to the fall? 2.  Possible small or early right inferior frontal lobe hemorrhagic contusion. 3.  Right scalp hematoma without underlying fracture. 4.  Cervical spine findings are below.  CT CERVICAL SPINE  Findings: Stable cervical vertebral height and alignment with relatively preserved lordosis. Visualized skull base is intact.  No atlanto-occipital dissociation.  Cervicothoracic junction alignment is within normal limits.  Bilateral posterior element alignment is within normal limits.  No acute cervical fracture identified. Visualized upper thoracic levels appear grossly intact.  Negative lung apices. Visualized paraspinal soft tissues are within normal limits.  IMPRESSION: No acute fracture or listhesis identified in the cervical spine. Ligamentous injury is not excluded.  Critical Value/emergent results were called by telephone at the time of interpretation on 09/21/2012 at 0919 hours to Dr. Azalia Bilis, who verbally acknowledged these results.   Original Report Authenticated By: Erskine Speed, M.D.   Ct Cervical Spine Wo Contrast  09/21/2012   *RADIOLOGY REPORT*  Clinical Data:  52 year old male status post fall from ladder with loss of consciousness, pain confusion.  CT HEAD WITHOUT CONTRAST CT CERVICAL SPINE WITHOUT CONTRAST  Technique:  Multidetector CT imaging of the head and cervical spine was performed following the standard protocol without intravenous contrast.  Multiplanar CT image reconstructions of the cervical spine were also generated.  Comparison:  Cervical spine CT 08/14/2006.  CT HEAD  Findings: Visualized orbit soft tissues are within normal limits. Broad-based right  posterior scalp hematoma measuring up to 7 mm in thickness.  Scalp soft tissues elsewhere within normal limits. Underlying calvarium intact. Visualized paranasal sinuses and mastoids are clear.  At the left suprasellar cistern and extending along the left M1 segment there is intermediate increased density and indistinctness of the subarachnoid space.  Other basilar cisterns appear normal. No intraventricular hemorrhage identified.  Partial volume artifact versus mild or early right inferior frontal lobe hemorrhagic contusion on series 3 image 13.  No mass effect or edema.  Elsewhere normal gray-white matter differentiation.  No other extra- axial hemorrhage. No evidence of cortically based acute infarction identified.  IMPRESSION: 1.  Evidence of small volume subarachnoid hemorrhage adjacent to the left ICA terminus.  This could be post traumatic.  Did the patient have any headache symptoms prior to the fall? 2.  Possible small or early right inferior frontal lobe hemorrhagic contusion. 3.  Right scalp hematoma without underlying fracture. 4.  Cervical spine findings are below.  CT CERVICAL SPINE  Findings: Stable cervical vertebral height and alignment with relatively preserved lordosis. Visualized skull base is intact.  No atlanto-occipital dissociation.  Cervicothoracic junction alignment is within normal limits.  Bilateral posterior element alignment is within normal limits.  No acute cervical fracture identified. Visualized upper thoracic levels appear grossly intact.  Negative lung apices. Visualized paraspinal soft tissues are  within normal limits.  IMPRESSION: No acute fracture or listhesis identified in the cervical spine. Ligamentous injury is not excluded.  Critical Value/emergent results were called by telephone at the time of interpretation on 09/21/2012 at 0919 hours to Dr. Azalia Bilis, who verbally acknowledged these results.   Original Report Authenticated By: Erskine Speed, M.D.   Dg Chest Portable  1 View  09/21/2012   *RADIOLOGY REPORT*  Clinical Data: Fall  PORTABLE CHEST - 1 VIEW  Comparison: 03/16/2010  Findings: Cardiomediastinal silhouette is stable.  No acute infiltrate or pleural effusion.  No pulmonary edema.  Bony thorax is stable.  IMPRESSION: No active disease.  No significant change.   Original Report Authenticated By: Natasha Mead, M.D.   Physical Exam  Constitutional: He is oriented to person, place, and time. He appears well-developed and well-nourished. No distress.  HENT:  Head: Normocephalic.  Mouth/Throat: No oropharyngeal exudate.  Right head contusion  Eyes: Conjunctivae and EOM are normal. Pupils are equal, round, and reactive to light. Right eye exhibits no discharge. Left eye exhibits no discharge. No scleral icterus.  Neck: Normal range of motion. Neck supple.  Cardiovascular: Normal rate, regular rhythm, normal heart sounds and intact distal pulses. Exam reveals no gallop and no friction rub.  No murmur heard.  Respiratory: Effort normal and breath sounds normal. No respiratory distress. He has no wheezes. He has no rales. He exhibits no tenderness.  GI: Soft. Bowel sounds are normal. He exhibits no distension and no mass. There is no tenderness. There is no rebound and no guarding.  Musculoskeletal: Normal range of motion. He exhibits no edema and no tenderness.  Neurological: He is alert and oriented to person, place, and time. He has normal reflexes. He displays normal reflexes. No cranial nerve deficit. He exhibits normal muscle tone. Coordination normal.  Skin: Skin is warm and dry. No rash noted. He is not diaphoretic. No erythema. No pallor.  Psychiatric: His behavior is normal. Judgment and thought content normal.  Flat affect, does not make eye contact   Patient Active Problem List   Diagnosis Date Noted  . Brain contusion 09/21/2012  . Subarachnoid hemorrhage 09/21/2012  . Type II or unspecified type diabetes mellitus without mention of complication,  uncontrolled 09/21/2012  . RHINOSINUSITIS, ACUTE 12/15/2007  . OCCUPATIONAL ASTHMA 07/16/2007  . SWELLING, MASS, OR LUMP IN CHEST 07/16/2007  . DYSPNEA ON EXERTION 05/18/2007  . HYPOGONADISM 05/10/2007  . ASTHMA 05/10/2007  . ESOPHAGEAL REFLUX 05/10/2007  . KLINEFELTERS SYNDROME 05/10/2007  . COUGH 05/10/2007  . OTHER NONSPECIFIC FINDING EXAMINATION OF URINE 05/10/2007   Assessment/Plan  Fall  Brain Contusion  Concussion SDH  -CT improved -Dr. Yetta Barre following -He is neurologically stable  -transfer to 4N -PT evaluation since pt will not have 24hr assistance and lives on 2nd floor -pain control  -ambulate  Diabetes mellitus, poorly controlled  -A1c 10.6% -start lantus 10units daily, will need PCP follow up -continue SSI and glucose monitoring -Diabetes education, nursing to teach self administration VTE prophylaxis  -no antithrombotic therapy due to principal problem  -SCDs and ambulate  Dispo -DC in AM  **the patient expressed he does not want any information given to wife, care order written.Ashok Norris, ANP-BC Pager: [phone] General Trauma PA Pager: [phone]   09/22/2012 9:20 AM

## 2012-09-22 NOTE — Plan of Care (Signed)
Problem: Food- and Nutrition-Related Knowledge Deficit (NB-1.1) Goal: Nutrition education Formal process to instruct or train a patient/client in a skill or to impart knowledge to help patients/clients voluntarily manage or modify food choices and eating behavior to maintain or improve health. Outcome: Completed/Met Date Met:  09/22/12  RD consulted for nutrition education regarding diabetes.  Per pt he was told by MD that he was borderline diabetic in 01/2012 and so he had not made any changes to his diet or been checking his blood sugars. Pt drinks a lot of sugar-sweetened beverages and skips meals.     Lab Results  Component Value Date    HGBA1C 10.6* 09/21/2012    RD provided "Carbohydrate Counting for People with Diabetes" handout from the Academy of [employer] and Dietetics. Discussed different food groups and their effects on blood sugar, emphasizing carbohydrate-containing foods. Provided list of carbohydrates and recommended serving sizes of common foods.  Discussed importance of controlled and consistent carbohydrate intake throughout the day. Provided examples of ways to balance meals/snacks and encouraged intake of high-fiber, whole grain complex carbohydrates. Teach back method used.  Expect faie compliance.  Body mass index is 28.16 kg/(m^2). Pt meets criteria for overweight based on current BMI.  Current diet order is CHO Modified, patient is consuming approximately 100% of meals at this time. Labs and medications reviewed. No further nutrition interventions warranted at this time. RD contact information provided. If additional nutrition issues arise, please re-consult RD.  Kendell Bane RD, LDN, CNSC [phone] Pager [phone] After Hours Pager

## 2012-09-22 NOTE — Clinical Social Work Note (Signed)
Clinical Social Work Department BRIEF PSYCHOSOCIAL ASSESSMENT 09/22/2012  Patient:  [name], [name]     Account Number:  [id number]     Admit date:  09/21/2012  Clinical Social Worker:  Verl Blalock  Date/Time:  09/22/2012 04:00 PM  Referred by:  RN  Date Referred:  09/22/2012 Referred for  Psychosocial assessment   Other Referral:   Interview type:  Patient Other interview type:   No family currently present at bedside    PSYCHOSOCIAL DATA Living Status:  ALONE Admitted from facility:   Level of care:   Primary support name:  Wilinski,Cindy   [phone] Primary support relationship to patient:  FAMILY Degree of support available:   Fair    CURRENT CONCERNS Current Concerns  None Noted   Other Concerns:    SOCIAL WORK ASSESSMENT / PLAN Clinical Social Worker met with patient at bedside to offer support and discuss patient needs at discharge.  Patient states that he works for [employer] and was up on an 8 foot ladder when he lost his footing and fell to the ground on the job site.  Patient is in the middle of a rough divorce and has moved out of the house and on his own into a second story apartment.  Patient plans to return to work as soon as possible and has already made arrangements with his supervisor for his return.  Patient with limited family support in the area but does have a large work family to assist at discharge.  Patient plans to return home at discharge once medically ready.    Clinical Social Worker inquired about patient current substance use.  Patient states that he does not drink any alcohol or use any drugs at this point in his life.  SBIRT complete and no resources needed at this time.  CSW signing off at this time.  Please reconsult if further CSW needs arise prior to patient discharge.   Assessment/plan status:  No Further Intervention Required Other assessment/ plan:   Information/referral to community resources:   No resources needed for patient  at this time.    PATIENT'S/FAMILY'S RESPONSE TO PLAN OF CARE: Patient alert and oriented x3 sitting up in the bed. Patient is anxious about getting discharged and his return to work.  Patient with limited family support but does have a large supportive work family.  Patient hopeful for discharge back to his apartment.  Patient verbalized his appreciation for CSW support and concern.   Macario Golds, [place] [phone]

## 2012-09-22 NOTE — Progress Notes (Signed)
Patient ID: [name], male   DOB: 1961-01-10, 52 y.o.   MRN: [id number] Subjective: Patient reports he feels better. He does have some headache. No numbness tingling or weakness.  Objective: Vital signs in last 24 hours: Temp:  [97.5 F (36.4 C)-98.7 F (37.1 C)] 97.5 F (36.4 C) (06/06 0400) Pulse Rate:  [65-83] 71 (06/06 0200) Resp:  [10-20] 17 (06/06 0200) BP: (108-143)/(49-85) 123/80 mmHg (06/06 0200) SpO2:  [91 %-98 %] 93 % (06/06 0200) Weight:  [106.595 kg (235 lb)] 106.595 kg (235 lb) (06/05 1101)  Intake/Output from previous day: 06/05 0701 - 06/06 0700 In: 583.8 [P.O.:240; I.V.:343.8] Out: 1850 [Urine:1850] Intake/Output this shift: Total I/O In: 240 [P.O.:240] Out: 750 [Urine:750]  Neurologic: Grossly normal, he is awake and alert and conversant moves all extremities, no pronator drift  Lab Results: Lab Results  Component Value Date   WBC 5.6 09/22/2012   HGB 13.2 09/22/2012   HCT 38.7* 09/22/2012   MCV 87.0 09/22/2012   PLT 144* 09/22/2012   Lab Results  Component Value Date   INR 0.97 09/21/2012   BMET Lab Results  Component Value Date   NA 134* 09/21/2012   K 3.8 09/21/2012   CL 98 09/21/2012   CO2 25 09/21/2012   GLUCOSE 385* 09/21/2012   BUN 13 09/21/2012   CREATININE 0.82 09/21/2012   CALCIUM 9.6 09/21/2012    Studies/Results: Dg Hip Complete Right  09/21/2012   *RADIOLOGY REPORT*  Clinical Data: 52 year old male with fall from ladder.  Right hip pain.  RIGHT HIP - COMPLETE 2+ VIEW  Comparison: None.  Findings: Bone mineralization is within normal limits.  Right hip joint space preserved.  Right proximal femur intact.  The pelvis appears intact.  Extensive degenerative spurring about the left femoral head and acetabulum appears to be chronic and may be sequelae of remote trauma and/or degeneration.  Chronic-appearing ossific fragment along the superior acetabulum on the left.  SI joints and sacral ala within normal limits.  IMPRESSION: No acute fracture or dislocation  identified about the right hip or pelvis.   Original Report Authenticated By: Erskine Speed, M.D.   Ct Head Wo Contrast  09/21/2012   *RADIOLOGY REPORT*  Clinical Data:  52 year old male status post fall from ladder with loss of consciousness, pain confusion.  CT HEAD WITHOUT CONTRAST CT CERVICAL SPINE WITHOUT CONTRAST  Technique:  Multidetector CT imaging of the head and cervical spine was performed following the standard protocol without intravenous contrast.  Multiplanar CT image reconstructions of the cervical spine were also generated.  Comparison:  Cervical spine CT 08/14/2006.  CT HEAD  Findings: Visualized orbit soft tissues are within normal limits. Broad-based right posterior scalp hematoma measuring up to 7 mm in thickness.  Scalp soft tissues elsewhere within normal limits. Underlying calvarium intact. Visualized paranasal sinuses and mastoids are clear.  At the left suprasellar cistern and extending along the left M1 segment there is intermediate increased density and indistinctness of the subarachnoid space.  Other basilar cisterns appear normal. No intraventricular hemorrhage identified.  Partial volume artifact versus mild or early right inferior frontal lobe hemorrhagic contusion on series 3 image 13.  No mass effect or edema.  Elsewhere normal gray-white matter differentiation.  No other extra- axial hemorrhage. No evidence of cortically based acute infarction identified.  IMPRESSION: 1.  Evidence of small volume subarachnoid hemorrhage adjacent to the left ICA terminus.  This could be post traumatic.  Did the patient have any headache symptoms prior to the  fall? 2.  Possible small or early right inferior frontal lobe hemorrhagic contusion. 3.  Right scalp hematoma without underlying fracture. 4.  Cervical spine findings are below.  CT CERVICAL SPINE  Findings: Stable cervical vertebral height and alignment with relatively preserved lordosis. Visualized skull base is intact.  No atlanto-occipital  dissociation.  Cervicothoracic junction alignment is within normal limits.  Bilateral posterior element alignment is within normal limits.  No acute cervical fracture identified. Visualized upper thoracic levels appear grossly intact.  Negative lung apices. Visualized paraspinal soft tissues are within normal limits.  IMPRESSION: No acute fracture or listhesis identified in the cervical spine. Ligamentous injury is not excluded.  Critical Value/emergent results were called by telephone at the time of interpretation on 09/21/2012 at 0919 hours to Dr. Azalia Bilis, who verbally acknowledged these results.   Original Report Authenticated By: Erskine Speed, M.D.   Ct Cervical Spine Wo Contrast  09/21/2012   *RADIOLOGY REPORT*  Clinical Data:  52 year old male status post fall from ladder with loss of consciousness, pain confusion.  CT HEAD WITHOUT CONTRAST CT CERVICAL SPINE WITHOUT CONTRAST  Technique:  Multidetector CT imaging of the head and cervical spine was performed following the standard protocol without intravenous contrast.  Multiplanar CT image reconstructions of the cervical spine were also generated.  Comparison:  Cervical spine CT 08/14/2006.  CT HEAD  Findings: Visualized orbit soft tissues are within normal limits. Broad-based right posterior scalp hematoma measuring up to 7 mm in thickness.  Scalp soft tissues elsewhere within normal limits. Underlying calvarium intact. Visualized paranasal sinuses and mastoids are clear.  At the left suprasellar cistern and extending along the left M1 segment there is intermediate increased density and indistinctness of the subarachnoid space.  Other basilar cisterns appear normal. No intraventricular hemorrhage identified.  Partial volume artifact versus mild or early right inferior frontal lobe hemorrhagic contusion on series 3 image 13.  No mass effect or edema.  Elsewhere normal gray-white matter differentiation.  No other extra- axial hemorrhage. No evidence of  cortically based acute infarction identified.  IMPRESSION: 1.  Evidence of small volume subarachnoid hemorrhage adjacent to the left ICA terminus.  This could be post traumatic.  Did the patient have any headache symptoms prior to the fall? 2.  Possible small or early right inferior frontal lobe hemorrhagic contusion. 3.  Right scalp hematoma without underlying fracture. 4.  Cervical spine findings are below.  CT CERVICAL SPINE  Findings: Stable cervical vertebral height and alignment with relatively preserved lordosis. Visualized skull base is intact.  No atlanto-occipital dissociation.  Cervicothoracic junction alignment is within normal limits.  Bilateral posterior element alignment is within normal limits.  No acute cervical fracture identified. Visualized upper thoracic levels appear grossly intact.  Negative lung apices. Visualized paraspinal soft tissues are within normal limits.  IMPRESSION: No acute fracture or listhesis identified in the cervical spine. Ligamentous injury is not excluded.  Critical Value/emergent results were called by telephone at the time of interpretation on 09/21/2012 at 0919 hours to Dr. Azalia Bilis, who verbally acknowledged these results.   Original Report Authenticated By: Erskine Speed, M.D.   Dg Chest Portable 1 View  09/21/2012   *RADIOLOGY REPORT*  Clinical Data: Fall  PORTABLE CHEST - 1 VIEW  Comparison: 03/16/2010  Findings: Cardiomediastinal silhouette is stable.  No acute infiltrate or pleural effusion.  No pulmonary edema.  Bony thorax is stable.  IMPRESSION: No active disease.  No significant change.   Original Report Authenticated By: Natasha Mead,  M.D.    Assessment/Plan:  head CT looks somewhat better especially in the right frontal region, some left traumatic subarachnoid hemorrhage remains. I think he safe to transfer to the floor. If he has 24-hour assistance he is likely okay to go home later today or tomorrow.   LOS: 1 day    Kmya Placide S 09/22/2012, 6:58  AM

## 2012-09-22 NOTE — Progress Notes (Signed)
Inpatient Diabetes Program Recommendations  AACE/ADA: New Consensus Statement on Inpatient Glycemic Control (2013)  Target Ranges:  Prepandial:   less than 140 mg/dL      Peak postprandial:   less than 180 mg/dL (1-2 hours)      Critically ill patients:  140 - 180 mg/dL   Reason for Visit: G9F=62.1% indicating poor control of diabetes.  Patient states that he has seen Dr. Juleen China in the past but then went to Dr. In [place] who told him that he did not have diabetes so patient stopped taking medications. CBG's greater than 300 mg/dL yesterday.  Patient reports that he has been under a lot of stress recently with a new job and divorce.  He states that he plans to follow-up after hospitalization with Dr. Juleen China.  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  Discussed action of Lantus insulin given once a day and lasts 24 hours.     Will likely need resumption of oral medications such as Glucophage and possibly a DPP4 inhibitor such as [place].  May also do well with GLP-1 Inhibitor such as Victoza or Byetta which is glucose dependent if patient is not agreeable to insulin initiation.  Needs to follow-up with Dr. Juleen China next week regarding outpatient diabetes regimen.     Note: Will order "Outpatient diabetes education" and "Living Well with diabetes" booklet.

## 2012-09-22 NOTE — Progress Notes (Signed)
Seen agree with above.   Still some post concussive issues. PT eval, transfer to floor.

## 2012-09-23 LAB — GLUCOSE, CAPILLARY: Glucose-Capillary: 173 mg/dL — ABNORMAL HIGH (ref 70–99)

## 2012-09-23 NOTE — Progress Notes (Signed)
Patient ID: [name], male   DOB: 12-16-1960, 52 y.o.   MRN: [id number] BP 119/71  Pulse 70  Temp(Src) 98 F (36.7 C) (Oral)  Resp 15  Ht 6\' 4"  (1.93 m)  Wt 104.9 kg (231 lb 4.2 oz)  BMI 28.16 kg/m2  SpO2 97% Alert and oriented x 4 Speech is clear and fluent, fund of knowledge is normal Following all commands Moving all extremities, no drift OK for discharge says he has a place to stay with friends where he will have 24 hour supervision.

## 2012-09-23 NOTE — Evaluation (Signed)
Physical Therapy Evaluation Patient Details Name: [name] MRN: [id number] DOB: 16-Dec-1960 Today's Date: 09/23/2012 Time: 4098-1191 PT Time Calculation (min): 15 min  PT Assessment / Plan / Recommendation Clinical Impression  Plan is for d/c home today with 24 hour supervision from friends.    PT Assessment  Patent does not need any further PT services    Follow Up Recommendations  No PT follow up    Does the patient have the potential to tolerate intense rehabilitation      Barriers to Discharge        Equipment Recommendations  None recommended by PT    Recommendations for Other Services     Frequency      Precautions / Restrictions Precautions Precautions: None   Pertinent Vitals/Pain 4/10      Mobility  Bed Mobility Bed Mobility: Supine to Sit;Sit to Supine Supine to Sit: 7: Independent Sit to Supine: 7: Independent Transfers Transfers: Sit to Stand;Stand to Sit Sit to Stand: 6: Modified independent (Device/Increase time) Stand to Sit: 6: Modified independent (Device/Increase time) Ambulation/Gait Ambulation/Gait Assistance: 5: Supervision Ambulation Distance (Feet): 220 Feet Assistive device: None Gait Pattern: Within Functional Limits Gait velocity: decreased Stairs: Yes Stairs Assistance: 4: Min guard Stair Management Technique: One rail Right;One rail Left;Alternating pattern;Forwards Number of Stairs: 9    Exercises     PT Diagnosis:    PT Problem List:   PT Treatment Interventions:     PT Goals    Visit Information  Last PT Received On: 09/23/12 Assistance Needed: +1    Subjective Data  Subjective: "I am ready to go home." Patient Stated Goal: home   Prior Functioning  Home Living Lives With: Alone Available Help at Discharge: Friend(s);Available 24 hours/day Type of Home: Apartment Home Access: Stairs to enter Entrance Stairs-Number of Steps: 12 Entrance Stairs-Rails: Can reach both;Left;Right Home Layout: One  level Bathroom Shower/Tub: Tub/shower unit Home Adaptive Equipment: None Prior Function Level of Independence: Independent Able to Take Stairs?: Yes Driving: Yes Vocation: Full time employment Communication Communication: No difficulties    Cognition  Cognition Arousal/Alertness: Awake/alert Behavior During Therapy: WFL for tasks assessed/performed Overall Cognitive Status: Within Functional Limits for tasks assessed    Extremity/Trunk Assessment     Balance Standardized Balance Assessment Standardized Balance Assessment: Berg Balance Test Berg Balance Test Sit to Stand: Able to stand without using hands and stabilize independently Standing Unsupported: Able to stand safely 2 minutes Sitting with Back Unsupported but Feet Supported on Floor or Stool: Able to sit safely and securely 2 minutes Stand to Sit: Sits safely with minimal use of hands Transfers: Able to transfer safely, definite need of hands Standing Unsupported with Eyes Closed: Able to stand 10 seconds safely Standing Ubsupported with Feet Together: Able to place feet together independently and stand 1 minute safely From Standing, Reach Forward with Outstretched Arm: Can reach confidently >25 cm (10") From Standing Position, Pick up Object from Floor: Able to pick up shoe, needs supervision From Standing Position, Turn to Look Behind Over each Shoulder: Looks behind from both sides and weight shifts well Turn 360 Degrees: Able to turn 360 degrees safely in 4 seconds or less Standing Unsupported, Alternately Place Feet on Step/Stool: Able to stand independently and safely and complete 8 steps in 20 seconds Standing Unsupported, One Foot in Front: Able to plae foot ahead of the other independently and hold 30 seconds Standing on One Leg: Able to lift leg independently and hold 5-10 seconds Total Score: 52  End of Session PT - End of Session Equipment Utilized During Treatment: Gait belt Activity Tolerance: Patient  tolerated treatment well Patient left: in chair;with call bell/phone within reach;with family/visitor present Nurse Communication: Mobility status  GP     Ilda Foil 09/23/2012, 4:05 PM  Aida Raider, PT  Office # [phone] Pager [phone]

## 2012-09-23 NOTE — Progress Notes (Signed)
Subjective: No complaints except mild headache  Objective: Vital signs in last 24 hours: Temp:  [97.8 F (36.6 C)-98.3 F (36.8 C)] 98 F (36.7 C) (06/07 0814) Pulse Rate:  [63-73] 64 (06/07 0400) Resp:  [0-23] 23 (06/07 0400) BP: (97-129)/(56-85) 104/71 mmHg (06/07 0400) SpO2:  [92 %-97 %] 95 % (06/07 0400) Weight:  [231 lb 4.2 oz (104.9 kg)] 231 lb 4.2 oz (104.9 kg) (06/06 1400) Last BM Date: 09/21/12  Intake/Output from previous day: 06/06 0701 - 06/07 0700 In: 3285 [P.O.:660; I.V.:2625] Out: 2500 [Urine:2500] Intake/Output this shift: Total I/O In: -  Out: 400 [Urine:400]  Awake and alert Lungs clear Abdomen soft  Lab Results:   Recent Labs  09/21/12 0849 09/22/12 0600  WBC 5.3 5.6  HGB 14.1 13.2  HCT 40.3 38.7*  PLT 163 144*   BMET  Recent Labs  09/21/12 0849 09/22/12 0600  NA 134* 136  K 3.8 3.8  CL 98 101  CO2 25 27  GLUCOSE 385* 261*  BUN 13 12  CREATININE 0.82 0.79  CALCIUM 9.6 9.2   PT/INR  Recent Labs  09/21/12 0849  LABPROT 12.8  INR 0.97   ABG No results found for this basename: PHART, PCO2, PO2, HCO3,  in the last 72 hours  Studies/Results: Dg Hip Complete Right  09/21/2012   *RADIOLOGY REPORT*  Clinical Data: 52 year old male with fall from ladder.  Right hip pain.  RIGHT HIP - COMPLETE 2+ VIEW  Comparison: None.  Findings: Bone mineralization is within normal limits.  Right hip joint space preserved.  Right proximal femur intact.  The pelvis appears intact.  Extensive degenerative spurring about the left femoral head and acetabulum appears to be chronic and may be sequelae of remote trauma and/or degeneration.  Chronic-appearing ossific fragment along the superior acetabulum on the left.  SI joints and sacral ala within normal limits.  IMPRESSION: No acute fracture or dislocation identified about the right hip or pelvis.   Original Report Authenticated By: Erskine Speed, M.D.   Ct Head Without Contrast  09/22/2012   *RADIOLOGY  REPORT* subdural hematoma.  Clinical Data: Subdural hematoma.  CT HEAD WITHOUT CONTRAST  Technique:  Contiguous axial images were obtained from the base of the skull through the vertex without contrast.  Comparison: CT head without contrast 09/21/2012.  Findings: The small focus of subarachnoid hemorrhage adjacent to the medial left temporal lobe is less conspicuous on today's exam. No new hemorrhage is evident.  There is no blood in the interpeduncular cistern or more laterally within the sylvian fissure.  The ventricles are of normal size.  No acute cortical infarct is present.  No focal mass lesion is evident.  The previous density in the inferior right frontal lobe was not the partial voluming.  There is no abnormality on today's exam in this region.  The paranasal sinuses and mastoid air cells are clear.  The osseous skull is intact.  IMPRESSION:  1.  Improving appearance of extra-axial hemorrhage adjacent to the medial left temporal lobe.   Original Report Authenticated By: Marin Roberts, M.D.   Dg Chest Portable 1 View  09/21/2012   *RADIOLOGY REPORT*  Clinical Data: Fall  PORTABLE CHEST - 1 VIEW  Comparison: 03/16/2010  Findings: Cardiomediastinal silhouette is stable.  No acute infiltrate or pleural effusion.  No pulmonary edema.  Bony thorax is stable.  IMPRESSION: No active disease.  No significant change.   Original Report Authenticated By: Natasha Mead, M.D.    Anti-infectives: Anti-infectives  None      Assessment/Plan: s/p * No surgery found *  Transfer to floor.  Home soon.  LOS: 2 days    [name] 09/23/2012

## 2012-09-23 NOTE — Progress Notes (Signed)
D/c order received. Notified Trauma MD regarding need for insulin and glucose meter prescriptions. MD unable to address today- pt will need to wait until MD rounds tomorrow. MD stated I could check with neurosurgeon if they would address this issue preventing d/c today. Dr. Franky Macho made aware and would like pt to go home and f/u with primary first thing Monday morning to treat his DM. Pt aware and okay with plan. Would still like to go home today.

## 2012-10-18 NOTE — Discharge Summary (Signed)
Physician Discharge Summary  Patient ID: [name] MRN: [id number] DOB/AGE: 1960/06/20 52 y.o.  Admit date: 09/21/2012 Discharge date: 10/18/2012  Admission Diagnoses:  Fall Brain contusion SDH Poorly controlled diabetes mellitus   Discharge Diagnoses:  Principal Problem:   Subarachnoid hemorrhage Active Problems:   Brain contusion   Type II or unspecified type diabetes mellitus without mention of complication, uncontrolled   Concussion with loss of consciousness   Discharged Condition: stable  Hospital Course:  Mr. Damon Hargrove is a 52 year old male who presented to [facility] ED following a fall off a 54ft ladder at home with loss of consciousness.  His work up showed SAH, contusion and scalp hematoma.  He was subsequently transferred to [facility] at which time he was neurologically intact.  His VS and neuro checks were monitored and remained stable.  NSG was consulted, the following day CT was stable.  He was stable for discharge, however, he lived alone and it was thought to be safe to keep the patient for an additional day.  He was also started on lantus for A1C of 10.6.  He was provided with diabetes education.  His laboratory evaluation remained stable.  VSS.  His pain was under adequate control. He was ambulating without any difficulties and therefore felt stable for discharge.  He was advised to follow up with his pcp in 2 weeks since he was started on insulin, but apparently wasn't provided with rx and instead asked to follow up with pcp on Monday morning.    Consults: Dr. Jones(Neurosurgery)  Significant Diagnostic Studies: *RADIOLOGY REPORT*  Clinical Data: 52 year old male status post fall from ladder with  loss of consciousness, pain confusion.  CT HEAD WITHOUT CONTRAST  CT CERVICAL SPINE WITHOUT CONTRAST  Technique: Multidetector CT imaging of the head and cervical spine  was performed following the standard protocol without intravenous  contrast. Multiplanar CT  image reconstructions of the cervical  spine were also generated.  Comparison: Cervical spine CT 08/14/2006.  CT HEAD  Findings: Visualized orbit soft tissues are within normal limits.  Broad-based right posterior scalp hematoma measuring up to 7 mm in  thickness. Scalp soft tissues elsewhere within normal limits.  Underlying calvarium intact. Visualized paranasal sinuses and  mastoids are clear.  At the left suprasellar cistern and extending along the left M1  segment there is intermediate increased density and indistinctness  of the subarachnoid space. Other basilar cisterns appear normal.  No intraventricular hemorrhage identified.  Partial volume artifact versus mild or early right inferior frontal  lobe hemorrhagic contusion on series 3 image 13. No mass effect or  edema.  Elsewhere normal gray-white matter differentiation. No other extra-  axial hemorrhage. No evidence of cortically based acute infarction  identified.  IMPRESSION:  1. Evidence of small volume subarachnoid hemorrhage adjacent to  the left ICA terminus. This could be post traumatic. Did the  patient have any headache symptoms prior to the fall?  2. Possible small or early right inferior frontal lobe hemorrhagic  contusion.  3. Right scalp hematoma without underlying fracture.  4. Cervical spine findings are below.  CT CERVICAL SPINE  Findings: Stable cervical vertebral height and alignment with  relatively preserved lordosis. Visualized skull base is intact. No  atlanto-occipital dissociation. Cervicothoracic junction alignment  is within normal limits. Bilateral posterior element alignment is  within normal limits. No acute cervical fracture identified.  Visualized upper thoracic levels appear grossly intact. Negative  lung apices. Visualized paraspinal soft tissues are within normal  limits.  IMPRESSION:  No acute fracture or listhesis identified in the cervical spine.  Ligamentous injury is not excluded.   Critical Value/emergent results were called by telephone at the  time of interpretation on 09/21/2012 at 0919 hours to Dr. Azalia Bilis, who verbally acknowledged these results.   CT HEAD WITHOUT CONTRAST  Technique: Contiguous axial images were obtained from the base of  the skull through the vertex without contrast.  Comparison: CT head without contrast 09/21/2012.  Findings: The small focus of subarachnoid hemorrhage adjacent to  the medial left temporal lobe is less conspicuous on today's exam.  No new hemorrhage is evident. There is no blood in the  interpeduncular cistern or more laterally within the sylvian  fissure. The ventricles are of normal size. No acute cortical  infarct is present. No focal mass lesion is evident. The previous  density in the inferior right frontal lobe was not the partial  voluming. There is no abnormality on today's exam in this region.  The paranasal sinuses and mastoid air cells are clear. The osseous  skull is intact.  IMPRESSION:  1. Improving appearance of extra-axial hemorrhage adjacent to the  medial left temporal lobe.  Discharge Exam: Blood pressure 144/93, pulse 76, temperature 97.8 F (36.6 C), temperature source Oral, resp. rate 14, height 6\' 4"  (1.93 m), weight 231 lb 4.2 oz (104.9 kg), SpO2 96.00%.  Disposition: 01-Home or Self Care  Discharge Orders   Future Orders Complete By Expires     Ambulatory referral to Nutrition and Diabetic Education  As directed         Medication List         FORTESTA 10 MG/ACT (2%) Gel  Generic drug:  Testosterone  Place 1 application onto the skin 4 (four) times daily.     metFORMIN 500 MG 24 hr tablet  Commonly known as:  GLUCOPHAGE-XR  Take 1,000 mg by mouth 2 (two) times daily.           Follow-up Information   Call Tia Alert, MD. (As needed)    Contact information:   [address] [place] [address] [phone]       Signed: Ashok Norris 10/18/2012,  4:11 PM

## 2014-08-02 ENCOUNTER — Other Ambulatory Visit: Payer: Self-pay | Admitting: *Deleted

## 2014-08-02 ENCOUNTER — Encounter: Payer: Self-pay | Admitting: Vascular Surgery

## 2014-08-02 DIAGNOSIS — I83813 Varicose veins of bilateral lower extremities with pain: Secondary | ICD-10-CM

## 2014-09-06 ENCOUNTER — Encounter: Payer: Self-pay | Admitting: Vascular Surgery

## 2014-09-09 ENCOUNTER — Ambulatory Visit (INDEPENDENT_AMBULATORY_CARE_PROVIDER_SITE_OTHER): Payer: BLUE CROSS/BLUE SHIELD | Admitting: Vascular Surgery

## 2014-09-09 ENCOUNTER — Encounter: Payer: Self-pay | Admitting: Vascular Surgery

## 2014-09-09 VITALS — BP 112/75 | HR 77 | Resp 18 | Ht 75.0 in | Wt 225.0 lb

## 2014-09-09 DIAGNOSIS — I83893 Varicose veins of bilateral lower extremities with other complications: Secondary | ICD-10-CM | POA: Diagnosis not present

## 2014-09-09 DIAGNOSIS — I83899 Varicose veins of unspecified lower extremities with other complications: Secondary | ICD-10-CM | POA: Insufficient documentation

## 2014-09-09 NOTE — Addendum Note (Signed)
Addended by: Sharee PimpleMCCHESNEY, Allanna Bresee K on: 09/09/2014 02:30 PM   Modules accepted: Orders

## 2014-09-09 NOTE — Progress Notes (Signed)
Subjective:     Patient ID: [name], male   DOB: 12/10/1960, 54 y.o.   MRN: [id number]  HPI this 54 year old male was referred for evaluation of painful varicose veins in both lower extremities. States he has had these for 10-15 years becoming increasingly painful and prominent. Left leg is worse in the right. He developed swelling in the ankle in both legs is a day progresses. He does not were elastic compression stockings. He has no history of DVT thrombophlebitis stasis ulcers or bleeding. His symptoms are affecting his daily living and ability to work because of pain.  Past Medical History  Diagnosis Date  . Diabetes mellitus without complication   . Allergy     History  Substance Use Topics  . Smoking status: Never Smoker   . Smokeless tobacco: Not on file  . Alcohol Use: No    Family History  Problem Relation Age of Onset  . Parkinson's disease Father   . Heart attack Maternal Grandmother     No Known Allergies   Current outpatient prescriptions:  .  dapagliflozin propanediol (FARXIGA) 10 MG TABS tablet, Take 10 mg by mouth daily., Disp: , Rfl:  .  metFORMIN (GLUCOPHAGE-XR) 500 MG 24 hr tablet, Take 1,000 mg by mouth 2 (two) times daily., Disp: , Rfl:  .  ramipril (ALTACE) 5 MG capsule, Take 5 mg by mouth daily., Disp: , Rfl:  .  Testosterone (FORTESTA) 10 MG/ACT (2%) GEL, Place 1 application onto the skin 4 (four) times daily., Disp: , Rfl:   Filed Vitals:   09/09/14 0924  BP: 112/75  Pulse: 77  Resp: 18  Height: 6\' 3"  (1.905 m)  Weight: 225 lb (102.059 kg)    Body mass index is 28.12 kg/(m^2).         Review of Systems denies chest pain, dyspnea on exertion, PND, orthopnea, hemoptysis.    Objective:   Physical Exam BP 112/75 mmHg  Pulse 77  Resp 18  Ht 6\' 3"  (1.905 m)  Wt 225 lb (102.059 kg)  BMI 28.12 kg/m2  Gen.-alert and oriented x3 in no apparent distress HEENT normal for age Lungs no rhonchi or wheezing Cardiovascular regular rhythm  no murmurs carotid pulses 3+ palpable no bruits audible Abdomen soft nontender no palpable masses Musculoskeletal free of  major deformities Skin clear -no rashes Neurologic normal Lower extremities 3+ femoral and dorsalis pedis pulses palpable bilaterally with 1+ edema bilaterally Left leg with extensive bulging varicosities beginning mid thigh medially extending down into the medial calf and anterior pretibial region and lateral calf with 1+ chronic edema. No hyperpigmentation or ulceration noted. Right leg with bulging varicosities in the medial calf and lateral calf area with prominent spider veins medial thigh.  Today I performed a bedside sono site ultrasound exam in the left leg has a large great saphenous vein with gross reflux applying these bulging varicosities.       Assessment:     Painful varicosities bilaterally due to gross reflux great saphenous system. Symptoms are affecting patient's daily living.    Plan:         #1 long leg elastic compression stockings 20-30 mm gradient #2 elevate legs as much as possible #3 ibuprofen daily on a regular basis for pain #4 return in 3 months-formal venous duplex exam will be performed bilaterally upon return. Recommendations will then be made. At the minimum patient will need laser ablation left great saphenous vein plus greater than 20 stab phlebectomy of painful varicosities. Return  in 3 months

## 2014-09-26 ENCOUNTER — Encounter (HOSPITAL_COMMUNITY): Payer: Self-pay

## 2014-09-26 ENCOUNTER — Encounter: Payer: Self-pay | Admitting: Vascular Surgery

## 2014-12-09 ENCOUNTER — Encounter: Payer: Self-pay | Admitting: Vascular Surgery

## 2014-12-10 ENCOUNTER — Ambulatory Visit (HOSPITAL_COMMUNITY)
Admission: RE | Admit: 2014-12-10 | Discharge: 2014-12-10 | Disposition: A | Discharge status: Home / Self Care | Payer: BLUE CROSS/BLUE SHIELD | Source: Ambulatory Visit | Attending: Vascular Surgery | Admitting: Vascular Surgery

## 2014-12-10 ENCOUNTER — Ambulatory Visit: Payer: Self-pay | Admitting: Vascular Surgery

## 2014-12-10 ENCOUNTER — Encounter: Payer: Self-pay | Admitting: Vascular Surgery

## 2014-12-10 ENCOUNTER — Ambulatory Visit (INDEPENDENT_AMBULATORY_CARE_PROVIDER_SITE_OTHER): Payer: BLUE CROSS/BLUE SHIELD | Admitting: Vascular Surgery

## 2014-12-10 VITALS — BP 114/77 | HR 79 | Temp 98.0°F | Resp 16 | Ht 75.0 in | Wt 222.0 lb

## 2014-12-10 DIAGNOSIS — I83893 Varicose veins of bilateral lower extremities with other complications: Secondary | ICD-10-CM | POA: Insufficient documentation

## 2014-12-10 NOTE — Progress Notes (Signed)
Subjective:     Patient ID: [name], male   DOB: June 23, 1960, 54 y.o.   MRN: [id number]  HPI this 54 year old male returns after 3 months for continued follow-up regarding his painful varicosities in both legs. His varicosities have been increasing in size over the pst 3 months and continued to cause aching throbbing and burning discomfort as well as distal edema. Left leg is worse in the right. He has no history of DVT, thrombophlebitis, stasis ulcers, or bleeding. He has tried long-leg elastic compression stockings 20-30 millimeter gradient as well as elevation ibuprofen over the past 3 months with no improvement in his symptoms. This is affecting his daily living.   Past Medical History  Diagnosis Date  . Diabetes mellitus without complication   . Allergy     Social History  Substance Use Topics  . Smoking status: Never Smoker   . Smokeless tobacco: Not on file  . Alcohol Use: No    Family History  Problem Relation Age of Onset  . Parkinson's disease Father   . Heart attack Maternal Grandmother     No Known Allergies   Current outpatient prescriptions:  .  dapagliflozin propanediol (FARXIGA) 10 MG TABS tablet, Take 10 mg by mouth daily., Disp: , Rfl:  .  metFORMIN (GLUCOPHAGE-XR) 500 MG 24 hr tablet, Take 1,000 mg by mouth 2 (two) times daily., Disp: , Rfl:  .  ramipril (ALTACE) 5 MG capsule, Take 5 mg by mouth daily., Disp: , Rfl:  .  Testosterone (FORTESTA) 10 MG/ACT (2%) GEL, Place 1 application onto the skin 4 (four) times daily., Disp: , Rfl:   Filed Vitals:   12/10/14 1430  BP: 114/77  Pulse: 79  Temp: 98 F (36.7 C)  Resp: 16  Height: 6\' 3"  (1.905 m)  Weight: 222 lb (100.699 kg)  SpO2: 100%    Body mass index is 27.75 kg/(m^2).           Review of Systems Denies chest pain , dyspnea on exertion, PND, orthopnea, hemoptysis, claudication     Objective:   Physical Exam BP 114/77 mmHg  Pulse 79  Temp(Src) 98 F (36.7 C)  Resp 16  Ht 6\' 3"   (1.905 m)  Wt 222 lb (100.699 kg)  BMI 27.75 kg/m2  SpO2 100%   Gen. Well-developed well-nourished male in no apparent distress alert and oriented 3  lungs no rhonchi or wheezing  left leg with extensive bulging varicosities in the medial thigh extending into medial calf down to the medial malleolar area with 1+ edema. 3+ dorsalis pedis pulse palpable. No active ulceration noted. Early hyperpigmentation noted. Right leg with bulging varicosities medial calf extending down toward medial malleolus with 1+ edema. 3+ dorsalis pedis pulse palpable. Extensive network of reticular veins in the medial thigh over great saphenous vein.  Today I ordered bilateral venous duplex exam which I reviewed and interpreted. There is gross reflux in bilateral great saphenous veins with large caliber veins particularly proximally near saphenofemoral junction. There is no DVT.     Assessment:      bilateral painful varicosities which are affecting patient's daily living and are resistant to conservative measures including long-leg elastic compression stockings 20-30 mm gradient, elevation, and ibuprofen. Patient has gross reflux in bilateral great saphenous vein suplying these painful varicosities     Plan:      patient needs #1 laser ablation left great saphenous vein plus greater than 20 stab phlebectomy followed by #2 laser ablation right great saphenous vein  plus 10-20 stab phlebectomy and 1 course of sclerotherapy for right leg only  We will proceed with precertification to perform this in the near future and hopefully relieve this nice man's symptoms

## 2014-12-25 ENCOUNTER — Other Ambulatory Visit: Payer: Self-pay | Admitting: *Deleted

## 2014-12-25 DIAGNOSIS — I83893 Varicose veins of bilateral lower extremities with other complications: Secondary | ICD-10-CM

## 2015-01-29 ENCOUNTER — Other Ambulatory Visit: Payer: Self-pay | Admitting: *Deleted

## 2015-01-29 DIAGNOSIS — I83893 Varicose veins of bilateral lower extremities with other complications: Secondary | ICD-10-CM

## 2015-01-30 ENCOUNTER — Encounter: Payer: Self-pay | Admitting: Vascular Surgery

## 2015-02-03 ENCOUNTER — Encounter: Payer: Self-pay | Admitting: Vascular Surgery

## 2015-02-03 ENCOUNTER — Ambulatory Visit (INDEPENDENT_AMBULATORY_CARE_PROVIDER_SITE_OTHER): Payer: BLUE CROSS/BLUE SHIELD | Admitting: Vascular Surgery

## 2015-02-03 VITALS — BP 118/78 | HR 84 | Temp 97.8°F | Resp 16 | Ht 76.0 in | Wt 225.0 lb

## 2015-02-03 DIAGNOSIS — I83893 Varicose veins of bilateral lower extremities with other complications: Secondary | ICD-10-CM

## 2015-02-03 HISTORY — PX: LASER ABLATION: SHX1947

## 2015-02-03 NOTE — Progress Notes (Signed)
Subjective:     Patient ID: [name], male   DOB: July 12, 1960, 54 y.o.   MRN: [id number]  HPI this 54 year old male had laser ablation left great saphenous vein from the mid thigh to near the saphenofemoral junction plus a proximally 30 stab phlebectomy of painful varicosities all performed under local tumescent anesthesia. A total of 1108 J of energy was utilized. He tolerated the procedure well.   Review of Systems     Objective:   Physical Exam BP 118/78 mmHg  Pulse 84  Temp(Src) 97.8 F (36.6 C)  Resp 16  Ht 6\' 4"  (1.93 m)  Wt 225 lb (102.059 kg)  BMI 27.40 kg/m2  SpO2 99%       Assessment:     Well-tolerated laser ablation left great saphenous vein plus greater than 20 stab phlebectomy of painful varicosities performed under local tumescent anesthesia    Plan:     Return in 1 week for venous duplex exam left leg to confirm closure We'll have similar procedure and contralateral right leg in the near future

## 2015-02-03 NOTE — Progress Notes (Signed)
Laser Ablation Procedure    Date: 02/03/2015   [name] DOB:Feb 26, 1961  Consent signed: Yes    Surgeon:  Dr. Quita SkyeJames D. Hart RochesterLawson  Procedure: Laser Ablation: left Greater Saphenous Vein  BP 118/78 mmHg  Pulse 84  Temp(Src) 97.8 F (36.6 C)  Resp 16  Ht 6\' 4"  (1.93 m)  Wt 225 lb (102.059 kg)  BMI 27.40 kg/m2  SpO2 99%  Tumescent Anesthesia: 460 cc 0.9% NaCl with 50 cc Lidocaine HCL with 1% Epi and 15 cc 8.4% NaHCO3  Local Anesthesia: 26 cc Lidocaine HCL and NaHCO3 (ratio 2:1)  Pulsed Mode: 15 watts, 500ms delay, 1.0 duration  Total Energy:              Total Pulses:                Total Time:     Stab Phlebectomy: >20 Sites: Thigh and Calf  Patient tolerated procedure well  Notes:   Description of Procedure:  After marking the course of the secondary varicosities, the patient was placed on the operating table in the supine position, and the left leg was prepped and draped in sterile fashion.   Local anesthetic was administered and under ultrasound guidance the saphenous vein was accessed with a micro needle and guide wire; then the mirco puncture sheath was placed.  A guide wire was inserted saphenofemoral junction , followed by a 5 french sheath.  The position of the sheath and then the laser fiber below the junction was confirmed using the ultrasound.  Tumescent anesthesia was administered along the course of the saphenous vein using ultrasound guidance. The patient was placed in Trendelenburg position and protective laser glasses were placed on patient and staff, and the laser was fired at 15 watts continuous mode advancing 1-872mm/second for a total of 1108 joules.   For stab phlebectomies, local anesthetic was administered at the previously marked varicosities, and tumescent anesthesia was administered around the vessels.  Greater than 20 stab wounds were made using the tip of an 11 blade. And using the vein hook, the phlebectomies were performed using a hemostat to avulse  the varicosities.  Adequate hemostasis was achieved.     Steri strips were applied to the stab wounds and ABD pads and thigh high compression stockings were applied.  Ace wrap bandages were applied over the phlebectomy sites and at the top of the saphenofemoral junction. Blood loss was less than 15 cc.  The patient ambulated out of the operating room having tolerated the procedure well.

## 2015-02-04 ENCOUNTER — Telehealth: Payer: Self-pay | Admitting: *Deleted

## 2015-02-04 ENCOUNTER — Encounter: Payer: Self-pay | Admitting: Vascular Surgery

## 2015-02-04 NOTE — Telephone Encounter (Signed)
Pt doing well. No bleeding. Some pain at the groin. Using ice and taking Ibuprofen as directed. Told him he could loosen the ace wraps today if needed. Follow prn.

## 2015-02-05 ENCOUNTER — Encounter: Payer: Self-pay | Admitting: Vascular Surgery

## 2015-02-06 ENCOUNTER — Telehealth: Payer: Self-pay | Admitting: *Deleted

## 2015-02-06 NOTE — Telephone Encounter (Signed)
Mr. [name] is s/p endovenous laser ablation left greater saphenous vein and stab phlebectomy >20 incisions left leg on 02-03-2015 by Betti CruzJD Lawson, M.D.  Mr. [name] states that he is having redness and irritation at the top of his left thigh (encircling the top of his left thigh) underneath his compression hose (where the silicon beading is located). Informed him that he was having a reaction to the silicon beading and that he needed to take the hose off and turn them inside out and wrap the top of the compression hose with an ace wrap to hold them up.  Advised him to call [facility] if he had further questions or concerns or if symptoms worsened.

## 2015-02-07 ENCOUNTER — Telehealth: Payer: Self-pay | Admitting: *Deleted

## 2015-02-07 NOTE — Telephone Encounter (Signed)
[name] is s/p 02-03-2015 endovenous laser ablation of left greater saphenous vein and stab phlebectomy >20 incisions left leg by Betti CruzJD Lawson M.D. [name] is c/o left inner thigh pain and redness.  Explained to [name] that the left inner thigh is the area that was treated with the laser ablation and that the ablation created an large inflammation that can be sore and painful and appear to be red.  Explained to him that the redness was not an infection but a result of the expected inflammation.  Advised him to take Ibuprofen 800 mg three times daily with food (meals) and to use ice packs/compress to the left inner thigh.  Also mentioned that Arnicare gel is helpful with bruising and is a topical anesthetic that might be helpful to him.  Encouraged him to call [facility] if symptoms worsened or if he had further questions or concerns.

## 2015-02-10 ENCOUNTER — Encounter: Payer: Self-pay | Admitting: Vascular Surgery

## 2015-02-10 ENCOUNTER — Ambulatory Visit (HOSPITAL_COMMUNITY)
Admission: RE | Admit: 2015-02-10 | Discharge: 2015-02-10 | Disposition: A | Discharge status: Home / Self Care | Payer: BLUE CROSS/BLUE SHIELD | Source: Ambulatory Visit | Attending: Vascular Surgery | Admitting: Vascular Surgery

## 2015-02-10 ENCOUNTER — Ambulatory Visit (INDEPENDENT_AMBULATORY_CARE_PROVIDER_SITE_OTHER): Payer: BLUE CROSS/BLUE SHIELD | Admitting: Vascular Surgery

## 2015-02-10 VITALS — BP 138/87 | HR 93 | Temp 98.7°F | Resp 16 | Ht 76.0 in | Wt 225.0 lb

## 2015-02-10 DIAGNOSIS — Z48812 Encounter for surgical aftercare following surgery on the circulatory system: Secondary | ICD-10-CM | POA: Insufficient documentation

## 2015-02-10 DIAGNOSIS — I83892 Varicose veins of left lower extremities with other complications: Secondary | ICD-10-CM

## 2015-02-10 DIAGNOSIS — I83893 Varicose veins of bilateral lower extremities with other complications: Secondary | ICD-10-CM | POA: Diagnosis not present

## 2015-02-10 NOTE — Progress Notes (Signed)
Subjective:     Patient ID: [name], male   DOB: 1960-10-25, 54 y.o.   MRN: [id number]  HPI this 54 year old male returns 1 week post-laser ablation left great saphenous vein plus greater than 20 stab phlebectomy of painful varicosities. He has worn his elastic compression stockings but has had some problems keeping the stocking up because of some irritation of the skin on his thigh. He's had some bruising and discomfort in the proximal medial thigh which is slowly improving. He's had no distal edema. He denies any chest pain dyspnea on exertion PND orthopnea or hemoptysis.  Past Medical History  Diagnosis Date  . Diabetes mellitus without complication (HCC)   . Allergy     Social History  Substance Use Topics  . Smoking status: Never Smoker   . Smokeless tobacco: Never Used  . Alcohol Use: No    Family History  Problem Relation Age of Onset  . Parkinson's disease Father   . Heart attack Maternal Grandmother     No Known Allergies   Current outpatient prescriptions:  .  dapagliflozin propanediol (FARXIGA) 10 MG TABS tablet, Take 10 mg by mouth daily., Disp: , Rfl:  .  metFORMIN (GLUCOPHAGE-XR) 500 MG 24 hr tablet, Take 1,000 mg by mouth 2 (two) times daily., Disp: , Rfl:  .  ramipril (ALTACE) 5 MG capsule, Take 5 mg by mouth daily., Disp: , Rfl:  .  Testosterone (FORTESTA) 10 MG/ACT (2%) GEL, Place 1 application onto the skin 4 (four) times daily., Disp: , Rfl:   Filed Vitals:   02/10/15 1423  BP: 138/87  Pulse: 93  Temp: 98.7 F (37.1 C)  Resp: 16  Height: 6\' 4"  (1.93 m)  Weight: 225 lb (102.059 kg)  SpO2: 93%    Body mass index is 27.4 kg/(m^2).           Review of Systems     Objective:   Physical Exam BP 138/87 mmHg  Pulse 93  Temp(Src) 98.7 F (37.1 C)  Resp 16  Ht 6\' 4"  (1.93 m)  Wt 225 lb (102.059 kg)  BMI 27.40 kg/m2  SpO2 93%  . Gen. well-developed well-nourished male in no apparent distress alert and oriented 3 Left leg with  moderate ecchymosis distal medial thigh. Small hematoma palpable beneath the skin upper third medial thigh near ablation site. Stab phlebectomy sites all healing nicely. No distal edema noted. 3+ dorsalis pedis pulse palpable.  Today ordered a venous duplex exam of the left leg which I reviewed and interpreted. There is no DVT. There is total closure of the left great saphenous vein from the distal insertion site to near the saphenofemoral femoral junction     Assessment:     Successful laser ablation left great saphenous vein plus multiple stab phlebectomy of painful varicosities    Plan:     Return in the next few weeks for similar procedure on contralateral right leg

## 2015-02-10 NOTE — Progress Notes (Deleted)
Laser Ablation Procedure    Date: 02/10/2015   Rondel JumboWilliam G Deason DOB:08-09-1960  Consent signed: Yes    Surgeon:  Dr. Quita SkyeJames D. Hart RochesterLawson  Procedure: Laser Ablation: left Small Saphenous Vein BP 128/71   HR  58   RR 16   Wt  165 LB  HT  5 8.5"   Temp 97.0  O2  Saturation 97%    Tumescent Anesthesia: 250 cc 0.9% NaCl with 50 cc Lidocaine HCL with 1% Epi and 15 cc 8.4% NaHCO3  Local Anesthesia: 5 cc Lidocaine HCL and NaHCO3 (ratio 2:1)  Pulsed Mode: 15 watts, 500ms delay, 1.0 duration  Total Energy:  1107 Joules            Total Pulses: 74               Total Time: 1:44      Patient tolerated procedure well    Description of Procedure:  After marking the course of the secondary varicosities, the patient was placed on the operating table in the prone position, and the left leg was prepped and draped in sterile fashion.   Local anesthetic was administered and under ultrasound guidance the saphenous vein was accessed with a micro needle and guide wire; then the mirco puncture sheath was placed.  A guide wire was inserted saphenopopliteal junction , followed by a 5 french sheath.  The position of the sheath and then the laser fiber below the junction was confirmed using the ultrasound.  Tumescent anesthesia was administered along the course of the saphenous vein using ultrasound guidance. The patient was placed in Trendelenburg position and protective laser glasses were placed on patient and staff, and the laser was fired at 15 watts continuous mode advancing 1-272mm/second for a total of 1107  joules.       Steri strips were applied and ABD pads and thigh high compression stockings were applied.  Ace wrap bandages were applied and at the top of the saphenopopliteal junction. Blood loss was less than 15 cc.  The patient ambulated out of the operating room having tolerated the procedure well.

## 2015-03-06 ENCOUNTER — Encounter: Payer: Self-pay | Admitting: Vascular Surgery

## 2015-03-10 ENCOUNTER — Ambulatory Visit (INDEPENDENT_AMBULATORY_CARE_PROVIDER_SITE_OTHER): Payer: BLUE CROSS/BLUE SHIELD | Admitting: Vascular Surgery

## 2015-03-10 ENCOUNTER — Encounter: Payer: Self-pay | Admitting: Vascular Surgery

## 2015-03-10 VITALS — BP 122/77 | HR 87 | Temp 98.0°F | Resp 16 | Ht 76.0 in | Wt 225.0 lb

## 2015-03-10 DIAGNOSIS — I83893 Varicose veins of bilateral lower extremities with other complications: Secondary | ICD-10-CM

## 2015-03-10 NOTE — Progress Notes (Signed)
Subjective:     Patient ID: [name], male   DOB: October 07, 1960, 54 y.o.   MRN: [id number]  HPI this 54 year old male had laser ablation right great saphenous vein from the mid thigh to near the saphenofemoral junction +10-20 stab phlebectomy of painful varicosities performed under local tumescent anesthesia. A total of 1050 J of energy was utilized. He tolerated the procedure well.   Review of Systems     Objective:   Physical Exam BP 122/77 mmHg  Pulse 87  Temp(Src) 98 F (36.7 C)  Resp 16  Ht 6\' 4"  (1.93 m)  Wt 225 lb (102.059 kg)  BMI 27.40 kg/m2  SpO2 99%       Assessment:     Well-tolerated laser ablation right great saphenous vein +10-20 stab phlebectomy of painful varicosities performed under local tumescent anesthesia    Plan:     Return in 1 week for venous duplex exam to confirm closure right great saphenous vein. Patient will then return for sclerotherapy to complete his treatment regimen

## 2015-03-10 NOTE — Progress Notes (Signed)
Laser Ablation Procedure    Date: 03/10/2015   Rondel JumboWilliam G Vanduzer DOB:05-25-1960  Consent signed: Yes    Surgeon:  Dr. Quita SkyeJames D. Hart RochesterLawson  Procedure: Laser Ablation: right Greater Saphenous Vein  BP 122/77 mmHg  Pulse 87  Temp(Src) 98 F (36.7 C)  Resp 16  Ht 6\' 4"  (1.93 m)  Wt 225 lb (102.059 kg)  BMI 27.40 kg/m2  SpO2 99%  Tumescent Anesthesia: 400 cc 0.9% NaCl with 50 cc Lidocaine HCL with 1% Epi and 15 cc 8.4% NaHCO3  Local Anesthesia: 8 cc Lidocaine HCL and NaHCO3 (ratio 2:1)  Pulsed Mode: 15 watts, 500ms delay, 1.0 duration  Total Energy:1035              Total Pulses: 70               Total Time: 1:09    Stab Phlebectomy: 10-20 Sites: Thigh and Calf  Patient tolerated procedure well  Notes:   Description of Procedure:  After marking the course of the secondary varicosities, the patient was placed on the operating table in the supine position, and the right leg was prepped and draped in sterile fashion.   Local anesthetic was administered and under ultrasound guidance the saphenous vein was accessed with a micro needle and guide wire; then the mirco puncture sheath was placed.  A guide wire was inserted saphenofemoral junction , followed by a 5 french sheath.  The position of the sheath and then the laser fiber below the junction was confirmed using the ultrasound.  Tumescent anesthesia was administered along the course of the saphenous vein using ultrasound guidance. The patient was placed in Trendelenburg position and protective laser glasses were placed on patient and staff, and the laser was fired at 15 watts continuous mode advancing 1-142mm/second for a total of 1035 joules.   For stab phlebectomies, local anesthetic was administered at the previously marked varicosities, and tumescent anesthesia was administered around the vessels.  Ten to 20 stab wounds were made using the tip of an 11 blade. And using the vein hook, the phlebectomies were performed using a hemostat to  avulse the varicosities.  Adequate hemostasis was achieved.     Steri strips were applied to the stab wounds and ABD pads and thigh high compression stockings were applied.  Ace wrap bandages were applied over the phlebectomy sites and at the top of the saphenofemoral junction. Blood loss was less than 15 cc.  The patient ambulated out of the operating room having tolerated the procedure well.

## 2015-03-11 ENCOUNTER — Encounter: Payer: Self-pay | Admitting: Vascular Surgery

## 2015-03-11 ENCOUNTER — Telehealth: Payer: Self-pay | Admitting: *Deleted

## 2015-03-11 NOTE — Telephone Encounter (Signed)
Pt having a lot of pain at the top of his leg. Taking 800mg  Advil asnd 100mg  Tylenol. Using ice. Encouraged him to walk some today and to use ice when he is resting. Fu appt is next week. Pt expressed understanding.

## 2015-03-17 ENCOUNTER — Encounter (HOSPITAL_COMMUNITY): Payer: Self-pay

## 2015-03-17 ENCOUNTER — Ambulatory Visit: Payer: Self-pay | Admitting: Vascular Surgery

## 2015-03-18 ENCOUNTER — Encounter: Payer: Self-pay | Admitting: Vascular Surgery

## 2015-03-18 ENCOUNTER — Ambulatory Visit (HOSPITAL_COMMUNITY)
Admission: RE | Admit: 2015-03-18 | Discharge: 2015-03-18 | Disposition: A | Discharge status: Home / Self Care | Payer: BLUE CROSS/BLUE SHIELD | Source: Ambulatory Visit | Attending: Vascular Surgery | Admitting: Vascular Surgery

## 2015-03-18 ENCOUNTER — Ambulatory Visit (INDEPENDENT_AMBULATORY_CARE_PROVIDER_SITE_OTHER): Payer: BLUE CROSS/BLUE SHIELD | Admitting: Vascular Surgery

## 2015-03-18 VITALS — BP 130/68 | HR 70 | Temp 99.1°F | Resp 14 | Ht 76.0 in | Wt 230.0 lb

## 2015-03-18 DIAGNOSIS — I83893 Varicose veins of bilateral lower extremities with other complications: Secondary | ICD-10-CM | POA: Diagnosis not present

## 2015-03-18 DIAGNOSIS — I83899 Varicose veins of unspecified lower extremities with other complications: Secondary | ICD-10-CM | POA: Insufficient documentation

## 2015-03-18 NOTE — Progress Notes (Signed)
Subjective:     Patient ID: [name], male   DOB: 1960/06/17, 54 y.o.   MRN: [id number]  HPI this 54 year old male returns 1 week post-laser ablation right great saphenous vein plus multiple stab phlebectomy of painful varicosities. He had some moderate discomfort in the mid thigh earlier in the week but that has resolved significantly. He has had no distal edema. He denies any chest pain hemoptysis or dyspnea on exertion. He is wearing his last compression stockings and has taken ibuprofen as instructed.   Review of Systems     Objective:   Physical Exam BP 130/68 mmHg  Pulse 70  Temp(Src) 99.1 F (37.3 C) (Oral)  Resp 14  Ht 6\' 4"  (1.93 m)  Wt 230 lb (104.327 kg)  BMI 28.01 kg/m2  The well-developed well-nourished male no apparent distress alert and oriented 3 Lungs no rhonchi or wheezing Right leg with mild ecchymosis in mid thigh and mild tenderness to deep palpation over great saphenous vein. Superficial network of spider veins and reticular veins in the distal thigh and calf still present. All stab phlebectomy sites healing nicely with no distal edema and 3+ dorsalis pedis pulse palpable.  Today I ordered a venous duplex exam of the right leg which I reviewed and interpreted. There is no DVT. There is successful closure of the right great saphenous vein up to 1 cm from the saphenofemoral junction     Assessment:     Successful laser ablation right great saphenous vein with multiple stab phlebectomy of painful varicosities    Plan:     Return soon for sclerotherapy of right leg to complete treatment regimen and then return on a when necessary basis

## 2015-04-25 ENCOUNTER — Encounter: Payer: Self-pay | Admitting: *Deleted

## 2015-04-30 ENCOUNTER — Ambulatory Visit (INDEPENDENT_AMBULATORY_CARE_PROVIDER_SITE_OTHER): Payer: BLUE CROSS/BLUE SHIELD | Admitting: *Deleted

## 2015-04-30 DIAGNOSIS — I83893 Varicose veins of bilateral lower extremities with other complications: Secondary | ICD-10-CM

## 2015-04-30 NOTE — Progress Notes (Signed)
X=.3%  Sotradecol administered with a 27g butterfly.  Patient received a total of 12cc.  Treated all areas of concern. Easy access. Anticipate good results. Will follow prn.  Photos: No.  Compression stockings applied: Yes.  and a 4" ace wrap

## 2015-05-02 ENCOUNTER — Telehealth: Payer: Self-pay

## 2015-05-02 NOTE — Telephone Encounter (Signed)
Pt. called to report falling last night, when he slipped on wet floor.  Reported his left leg was scraped-up, but that he was "able to keep right leg straight", and off the floor.  Reported he had not removed his compression stocking and bandage from the right leg yet.  Questioned if the nurse thought he could have damaged anything, from having the recent sclerotherapy?  Advised pt. the Sclerotherapy medication had already been instilled into the spider veins, and that a fall should not have affected the medication's effect on his veins.  Advised to monitor for any signs of injury to the legs, and to cleanse the scraped areas of skin, and monitor for signs of infection.  Advised will make Clementeen HoofLiz Wert, RN aware of pt's fall.  Pt. Verb. Understanding.

## 2015-05-06 ENCOUNTER — Emergency Department (HOSPITAL_COMMUNITY): Payer: BLUE CROSS/BLUE SHIELD

## 2015-05-06 ENCOUNTER — Inpatient Hospital Stay (HOSPITAL_COMMUNITY): Payer: BLUE CROSS/BLUE SHIELD

## 2015-05-06 ENCOUNTER — Inpatient Hospital Stay (HOSPITAL_COMMUNITY)
Admission: EM | Admit: 2015-05-06 | Discharge: 2015-05-09 | DRG: 194 | Disposition: A | Discharge status: Home / Self Care | Payer: BLUE CROSS/BLUE SHIELD | Attending: Family Medicine | Admitting: Family Medicine

## 2015-05-06 ENCOUNTER — Encounter (HOSPITAL_COMMUNITY): Payer: Self-pay | Admitting: Emergency Medicine

## 2015-05-06 DIAGNOSIS — Z91040 Latex allergy status: Secondary | ICD-10-CM | POA: Diagnosis not present

## 2015-05-06 DIAGNOSIS — K219 Gastro-esophageal reflux disease without esophagitis: Secondary | ICD-10-CM | POA: Diagnosis present

## 2015-05-06 DIAGNOSIS — Z820 Family history of epilepsy and other diseases of the nervous system: Secondary | ICD-10-CM | POA: Diagnosis not present

## 2015-05-06 DIAGNOSIS — R410 Disorientation, unspecified: Secondary | ICD-10-CM | POA: Diagnosis present

## 2015-05-06 DIAGNOSIS — J209 Acute bronchitis, unspecified: Secondary | ICD-10-CM | POA: Diagnosis present

## 2015-05-06 DIAGNOSIS — R0602 Shortness of breath: Secondary | ICD-10-CM | POA: Diagnosis not present

## 2015-05-06 DIAGNOSIS — Z7984 Long term (current) use of oral hypoglycemic drugs: Secondary | ICD-10-CM | POA: Diagnosis not present

## 2015-05-06 DIAGNOSIS — H547 Unspecified visual loss: Secondary | ICD-10-CM | POA: Diagnosis present

## 2015-05-06 DIAGNOSIS — G44001 Cluster headache syndrome, unspecified, intractable: Secondary | ICD-10-CM | POA: Diagnosis not present

## 2015-05-06 DIAGNOSIS — R51 Headache: Secondary | ICD-10-CM | POA: Diagnosis present

## 2015-05-06 DIAGNOSIS — R1314 Dysphagia, pharyngoesophageal phase: Secondary | ICD-10-CM | POA: Diagnosis not present

## 2015-05-06 DIAGNOSIS — E119 Type 2 diabetes mellitus without complications: Secondary | ICD-10-CM | POA: Diagnosis present

## 2015-05-06 DIAGNOSIS — J189 Pneumonia, unspecified organism: Principal | ICD-10-CM | POA: Diagnosis present

## 2015-05-06 DIAGNOSIS — K210 Gastro-esophageal reflux disease with esophagitis: Secondary | ICD-10-CM | POA: Diagnosis present

## 2015-05-06 DIAGNOSIS — Z79899 Other long term (current) drug therapy: Secondary | ICD-10-CM

## 2015-05-06 DIAGNOSIS — J200 Acute bronchitis due to Mycoplasma pneumoniae: Secondary | ICD-10-CM | POA: Diagnosis not present

## 2015-05-06 DIAGNOSIS — I8390 Asymptomatic varicose veins of unspecified lower extremity: Secondary | ICD-10-CM | POA: Diagnosis present

## 2015-05-06 DIAGNOSIS — J208 Acute bronchitis due to other specified organisms: Secondary | ICD-10-CM | POA: Diagnosis not present

## 2015-05-06 DIAGNOSIS — R05 Cough: Secondary | ICD-10-CM | POA: Diagnosis present

## 2015-05-06 DIAGNOSIS — Z7982 Long term (current) use of aspirin: Secondary | ICD-10-CM

## 2015-05-06 DIAGNOSIS — R519 Headache, unspecified: Secondary | ICD-10-CM | POA: Insufficient documentation

## 2015-05-06 DIAGNOSIS — Z8249 Family history of ischemic heart disease and other diseases of the circulatory system: Secondary | ICD-10-CM | POA: Diagnosis not present

## 2015-05-06 DIAGNOSIS — R7881 Bacteremia: Secondary | ICD-10-CM

## 2015-05-06 DIAGNOSIS — E1165 Type 2 diabetes mellitus with hyperglycemia: Secondary | ICD-10-CM | POA: Diagnosis not present

## 2015-05-06 DIAGNOSIS — R079 Chest pain, unspecified: Secondary | ICD-10-CM | POA: Diagnosis present

## 2015-05-06 DIAGNOSIS — H4310 Vitreous hemorrhage, unspecified eye: Secondary | ICD-10-CM | POA: Diagnosis present

## 2015-05-06 DIAGNOSIS — R1310 Dysphagia, unspecified: Secondary | ICD-10-CM | POA: Insufficient documentation

## 2015-05-06 LAB — COMPREHENSIVE METABOLIC PANEL
ALT: 18 U/L (ref 17–63)
ANION GAP: 11 (ref 5–15)
AST: 19 U/L (ref 15–41)
Albumin: 4.1 g/dL (ref 3.5–5.0)
Alkaline Phosphatase: 82 U/L (ref 38–126)
BUN: 17 mg/dL (ref 6–20)
CHLORIDE: 102 mmol/L (ref 101–111)
CO2: 25 mmol/L (ref 22–32)
CREATININE: 0.96 mg/dL (ref 0.61–1.24)
Calcium: 9.2 mg/dL (ref 8.9–10.3)
Glucose, Bld: 166 mg/dL — ABNORMAL HIGH (ref 65–99)
POTASSIUM: 3.9 mmol/L (ref 3.5–5.1)
Sodium: 138 mmol/L (ref 135–145)
Total Bilirubin: 1.7 mg/dL — ABNORMAL HIGH (ref 0.3–1.2)
Total Protein: 7.5 g/dL (ref 6.5–8.1)

## 2015-05-06 LAB — URINALYSIS, ROUTINE W REFLEX MICROSCOPIC
BILIRUBIN URINE: NEGATIVE
Glucose, UA: >1000 mg/dL — AB
HGB URINE DIPSTICK: NEGATIVE
Ketones, ur: NEGATIVE mg/dL
Leukocytes, UA: NEGATIVE
NITRITE: NEGATIVE
PH: 5.5 (ref 5.0–8.0)
Protein, ur: NEGATIVE mg/dL
SPECIFIC GRAVITY, URINE: 1.046 — AB (ref 1.005–1.030)

## 2015-05-06 LAB — CBC WITH DIFFERENTIAL/PLATELET
Basophils Absolute: 0 10*3/uL (ref 0.0–0.1)
Basophils Relative: 0 %
Eosinophils Absolute: 0 10*3/uL (ref 0.0–0.7)
Eosinophils Relative: 0 %
HCT: 49.2 % (ref 39.0–52.0)
Hemoglobin: 16.8 g/dL (ref 13.0–17.0)
LYMPHS ABS: 2.1 10*3/uL (ref 0.7–4.0)
LYMPHS PCT: 28 %
MCH: 31.9 pg (ref 26.0–34.0)
MCHC: 34.1 g/dL (ref 30.0–36.0)
MCV: 93.5 fL (ref 78.0–100.0)
MONOS PCT: 7 %
Monocytes Absolute: 0.5 10*3/uL (ref 0.1–1.0)
NEUTROS PCT: 65 %
Neutro Abs: 5 10*3/uL (ref 1.7–7.7)
PLATELETS: 155 10*3/uL (ref 150–400)
RBC: 5.26 MIL/uL (ref 4.22–5.81)
RDW: 12.6 % (ref 11.5–15.5)
WBC: 7.7 10*3/uL (ref 4.0–10.5)

## 2015-05-06 LAB — INFLUENZA PANEL BY PCR (TYPE A & B)
H1N1 flu by pcr: NOT DETECTED
INFLAPCR: NEGATIVE
Influenza B By PCR: NEGATIVE

## 2015-05-06 LAB — MAGNESIUM: Magnesium: 1.8 mg/dL (ref 1.7–2.4)

## 2015-05-06 LAB — URINE MICROSCOPIC-ADD ON
RBC / HPF: NONE SEEN RBC/hpf (ref 0–5)
WBC UA: NONE SEEN WBC/hpf (ref 0–5)

## 2015-05-06 LAB — TROPONIN I
Troponin I: <0.03 ng/mL (ref <0.031)
Troponin I: <0.03 ng/mL (ref <0.031)

## 2015-05-06 LAB — TSH: TSH: 0.441 u[IU]/mL (ref 0.350–4.500)

## 2015-05-06 LAB — I-STAT CG4 LACTIC ACID, ED: Lactic Acid, Venous: 1.34 mmol/L (ref 0.5–2.0)

## 2015-05-06 MED ORDER — ONDANSETRON HCL 4 MG PO TABS: 4.0000 mg | ORAL_TABLET | Freq: Four times a day (QID) | ORAL | Status: DC | PRN

## 2015-05-06 MED ORDER — ACETAMINOPHEN 325 MG PO TABS
650.0000 mg | ORAL_TABLET | Freq: Once | ORAL | Status: AC
  Administered 2015-05-06: 650 mg via ORAL
  Filled 2015-05-06: qty 2

## 2015-05-06 MED ORDER — SODIUM CHLORIDE 0.9 % IJ SOLN
3.0000 mL | Freq: Two times a day (BID) | INTRAMUSCULAR | Status: DC
  Administered 2015-05-07 – 2015-05-09 (×3): 3 mL via INTRAVENOUS

## 2015-05-06 MED ORDER — ENOXAPARIN SODIUM 40 MG/0.4ML ~~LOC~~ SOLN
40.0000 mg | SUBCUTANEOUS | Status: DC
  Administered 2015-05-06 – 2015-05-07 (×2): 40 mg via SUBCUTANEOUS
  Filled 2015-05-06 (×3): qty 0.4

## 2015-05-06 MED ORDER — ACETAMINOPHEN 325 MG PO TABS
650.0000 mg | ORAL_TABLET | Freq: Four times a day (QID) | ORAL | Status: DC | PRN
  Administered 2015-05-07: 650 mg via ORAL
  Filled 2015-05-06: qty 2

## 2015-05-06 MED ORDER — SODIUM CHLORIDE 0.9 % IV SOLN
INTRAVENOUS | Status: DC
  Administered 2015-05-06 – 2015-05-07 (×2): via INTRAVENOUS
  Administered 2015-05-08: 125 mL via INTRAVENOUS
  Administered 2015-05-09 (×2): via INTRAVENOUS

## 2015-05-06 MED ORDER — SODIUM CHLORIDE 0.9 % IV SOLN: INTRAVENOUS | Status: DC

## 2015-05-06 MED ORDER — ONDANSETRON HCL 4 MG/2ML IJ SOLN
4.0000 mg | Freq: Four times a day (QID) | INTRAMUSCULAR | Status: DC | PRN
Start: 2015-05-06 — End: 2015-05-09

## 2015-05-06 MED ORDER — LEVALBUTEROL HCL 0.63 MG/3ML IN NEBU: 0.6300 mg | INHALATION_SOLUTION | Freq: Four times a day (QID) | RESPIRATORY_TRACT | Status: DC | PRN

## 2015-05-06 MED ORDER — DEXTROSE 5 % IV SOLN
500.0000 mg | INTRAVENOUS | Status: DC
Start: 2015-05-06 — End: 2015-05-07
  Administered 2015-05-06: 500 mg via INTRAVENOUS
  Filled 2015-05-06: qty 500

## 2015-05-06 MED ORDER — DEXTROSE 5 % IV SOLN
1.0000 g | INTRAVENOUS | Status: DC
  Administered 2015-05-06 – 2015-05-08 (×3): 1 g via INTRAVENOUS
  Filled 2015-05-06 (×4): qty 10

## 2015-05-06 MED ORDER — HYDROMORPHONE HCL 1 MG/ML IJ SOLN
0.5000 mg | INTRAMUSCULAR | Status: DC | PRN
  Administered 2015-05-06 – 2015-05-07 (×3): 0.5 mg via INTRAVENOUS
  Filled 2015-05-06 (×3): qty 1

## 2015-05-06 MED ORDER — ACETAMINOPHEN 650 MG RE SUPP: 650.0000 mg | Freq: Four times a day (QID) | RECTAL | Status: DC | PRN

## 2015-05-06 MED ORDER — DOCUSATE SODIUM 100 MG PO CAPS
100.0000 mg | ORAL_CAPSULE | Freq: Two times a day (BID) | ORAL | Status: DC
  Administered 2015-05-06 – 2015-05-08 (×5): 100 mg via ORAL
  Filled 2015-05-06 (×5): qty 1

## 2015-05-06 NOTE — ED Notes (Addendum)
Pt has had a cough at home for the past 2 weeks. Yesterday evening pt had a coughing attack in which he coughed up some blood. Pt also has had a black "floater" in his L eye. Wife also reports pt was confused last night. No confusion today. Has had a fever at home earlier in the week.

## 2015-05-06 NOTE — H&P (Signed)
[facility] History and Physical  [name] AVW:[id number] DOB: [birth year] DOA: 05/06/2015  Referring physician:   PCP: Michiel Sites, MD   Chief Complaint:   Loss of Vision  . Cough         HPI:  55 year old male with history of diabetes, varicose veins, status post Successful laser ablation right great saphenous vein with multiple stab phlebectomy of painful varicosities by Dr. Hart Rochester, who presents to the ER today brought in by the patient's girlfriend for altered mental status, fever. The patient has not been feeling well for 2-3 weeks, apparently fell on 1/13. Fall not associated with any trauma the patient was not seen in the ER,   patient presents today with productive spasmodic cough, associated with streaks of blood. Patient coughed so hard that he had some associated nausea vomiting again mixed with streaks of blood, after the coughing spell the patient noticed a floater in his left eye, he also has a global headache, somewhat confused last night and was brought into the ER by his girlfriend. The ER the patient was found have a fever of 102F, visual symptoms were discussed with ophthalmologist on call Dr. Sherryll Burger, who thought that the patient may have had a posterior vitreous hemorrhage and needs to follow-up with him as an outpatient. If this patient's vision continues to get worse, he sees floaters then ophthalmologist will, and see him in the hospital. Influenza PCR pending. Chest x-ray negative for pneumonia.      Review of Systems: negative for the following  Constitutional: Positive for fever, chills, diaphoresis, appetite change and fatigue.  HEENT: Denies photophobia, eye pain, redness, hearing loss, ear pain, congestion, sore throat, rhinorrhea, sneezing, mouth sores, trouble swallowing, neck pain, neck stiffness and tinnitus.  Respiratory: Denies SOB, DOE, positive for spasmodic cough, chest tightness, and wheezing.  Cardiovascular: Denies chest pain,  palpitations and leg swelling.  Gastrointestinal: Positive for nausea, vomiting, abdominal pain, diarrhea, constipation, blood in stool and abdominal distention.  Genitourinary: Denies dysuria, urgency, frequency, hematuria, flank pain and difficulty urinating.  Musculoskeletal: Denies myalgias, back pain, joint swelling, arthralgias and gait problem.  Skin: Denies pallor, rash and wound.  Neurological: Denies dizziness, seizures, syncope, weakness, light-headedness, numbness and positive for headaches.  Hematological: Denies adenopathy. Easy bruising, personal or family bleeding history  Psychiatric/Behavioral: Denies suicidal ideation, mood changes, confusion, nervousness, sleep disturbance and agitation       Past Medical History  Diagnosis Date  . Diabetes mellitus without complication (HCC)   . Allergy      Past Surgical History  Procedure Laterality Date  . Laser ablation Left 02/03/15  . Varicose vein surgery        Social History:  reports that he has never smoked. He has never used smokeless tobacco. He reports that he does not drink alcohol or use illicit drugs.    Allergies  Allergen Reactions  . Latex Rash    Family History  Problem Relation Age of Onset  . Parkinson's disease Father   . Heart attack Maternal Grandmother          Prior to Admission medications   Medication Sig Start Date End Date Taking? Authorizing Provider  acetaminophen (TYLENOL) 500 MG tablet Take 1,000 mg by mouth every 6 (six) hours as needed for mild pain or moderate pain.   Yes Historical Provider, MD  aspirin EC 81 MG tablet Take 81 mg by mouth daily.   Yes Historical Provider, MD  dapagliflozin propanediol (FARXIGA) 10 MG TABS tablet Take  10 mg by mouth daily.   Yes Historical Provider, MD  ibuprofen (ADVIL,MOTRIN) 200 MG tablet Take 400 mg by mouth every 6 (six) hours as needed for headache or moderate pain.   Yes Historical Provider, MD  metFORMIN (GLUCOPHAGE-XR) 500 MG 24  hr tablet Take 1,000 mg by mouth 2 (two) times daily.   Yes Historical Provider, MD  Multiple Vitamin (MULTIVITAMIN WITH MINERALS) TABS tablet Take 2 tablets by mouth daily. Mega men 50 plus gnc   Yes Historical Provider, MD  OVER THE COUNTER MEDICATION Take 2 capsules by mouth daily. apexatropin supplement   Yes Historical Provider, MD  OVER THE COUNTER MEDICATION Take 1 tablet by mouth daily as needed (sex drive). Over the counter sex drive medication   Yes Historical Provider, MD  ramipril (ALTACE) 5 MG capsule Take 5 mg by mouth daily.   Yes Historical Provider, MD  Testosterone (ANDROGEL) 20.25 MG/1.25GM (1.62%) GEL Place 2 application onto the skin daily. 2 pumps on each shoulder daily   Yes Historical Provider, MD     Physical Exam: Filed Vitals:   05/06/15 1310 05/06/15 1450 05/06/15 1533 05/06/15 1646  BP: 105/69 103/73 105/70 112/79  Pulse: 74 78 70 75  Temp: 98.7 F (37.1 C) 98.7 F (37.1 C) 98.8 F (37.1 C) 98.9 F (37.2 C)  TempSrc: Oral Oral Oral Oral  Resp: SpO2: 93% 98% 96% 97%     Constitutional: Vital signs reviewed. Patient is a well-developed and well-nourished in no acute distress and cooperative with exam. Alert and oriented x3.  Head: Normocephalic and atraumatic  Ear: TM normal bilaterally  Mouth: no erythema or exudates, MMM  Eyes: PERRL, EOMI, conjunctivae normal, No scleral icterus.  Neck: Supple, Trachea midline normal ROM, No JVD, mass, thyromegaly, or carotid bruit present.  Cardiovascular: RRR, S1 normal, S2 normal, no MRG, pulses symmetric and intact bilaterally  Pulmonary/Chest: Effort normal. No stridor. No respiratory distress. He has decreased breath sounds. He has wheezes. He has no rhonchi. He has no rales.  Abdominal: Soft. Non-tender, non-distended, bowel sounds are normal, no masses, organomegaly, or guarding present.  GU: no CVA tenderness Musculoskeletal: No joint deformities, erythema, or stiffness, ROM full and no  nontender Ext: no edema and no cyanosis, pulses palpable bilaterally (DP and PT)  Hematology: no cervical, inginal, or axillary adenopathy.  Neurological: A&O x3, Strenght is normal and symmetric bilaterally, cranial nerve II-XII are grossly intact, no focal motor deficit, sensory intact to light touch bilaterally.  Skin: Warm, dry and intact. No rash, cyanosis, or clubbing.  Psychiatric: Normal mood and affect. speech and behavior is normal. Judgment and thought content normal. Cognition and memory are normal.      Data Review   Micro Results No results found for this or any previous visit (from the past 240 hour(s)).  Radiology Reports Dg Chest 2 View  05/06/2015  CLINICAL DATA:  Shortness of breath EXAM: CHEST  2 VIEW COMPARISON:  09/21/2012 FINDINGS: Normal heart size and mediastinal contours. No acute infiltrate or edema. No effusion or pneumothorax. No acute osseous findings. IMPRESSION: No evidence of acute cardiopulmonary disease. Electronically Signed   By: Marnee Spring M.D.   On: 05/06/2015 12:27     CBC  Recent Labs Lab 05/06/15 1220  WBC 7.7  HGB 16.8  HCT 49.2  PLT 155  MCV 93.5  MCH 31.9  MCHC 34.1  RDW 12.6  LYMPHSABS 2.1  MONOABS 0.5  EOSABS 0.0  BASOSABS 0.0  Chemistries   Recent Labs Lab 05/06/15 1220  NA 138  K 3.9  CL 102  CO2 25  GLUCOSE 166*  BUN 17  CREATININE 0.96  CALCIUM 9.2  AST 19  ALT 18  ALKPHOS 82  BILITOT 1.7*   ------------------------------------------------------------------------------------------------------------------ CrCl cannot be calculated (Unknown ideal weight.). ------------------------------------------------------------------------------------------------------------------ No results for input(s): HGBA1C in the last 72 hours. ------------------------------------------------------------------------------------------------------------------ No results for input(s): CHOL, HDL, LDLCALC, TRIG, CHOLHDL,  LDLDIRECT in the last 72 hours. ------------------------------------------------------------------------------------------------------------------ No results for input(s): TSH, T4TOTAL, T3FREE, THYROIDAB in the last 72 hours.  Invalid input(s): FREET3 ------------------------------------------------------------------------------------------------------------------ No results for input(s): VITAMINB12, FOLATE, FERRITIN, TIBC, IRON, RETICCTPCT in the last 72 hours.  Coagulation profile No results for input(s): INR, PROTIME in the last 168 hours.  No results for input(s): DDIMER in the last 72 hours.  Cardiac Enzymes  Recent Labs Lab 05/06/15 1520  TROPONINI <0.03   ------------------------------------------------------------------------------------------------------------------ Invalid input(s): POCBNP   CBG: No results for input(s): GLUCAP in the last 168 hours.     EKG: Independently reviewed.    Assessment/Plan Active Problems:   Acute bronchitis-patient will be admitted because of his chest pain, fever, Started on antibiotics empirically with Rocephin azithromycin secondary to severe spasmodic cough Repeat chest x-ray tomorrow after patient has been hydrated with IV fluids  If  patient does not have pneumonia that antibiotics can be discontinued Follow blood culture results  Floater in the left eye Likely posterior vitreous hemorrhage, patient needs to see ophthalmology in the outpatient setting unless his symptoms are getting worse in the hospital that the patient needs to be seen sooner by the ophthalmologist. MRI of the brain ordered because of recent fall and headache, results pending at this time   Chest pain-likely noncardiac,   , troponin negative, EKG within normal limits      Code Status:   full Family Communication: bedside Disposition Plan: admit   Total time spent 55 minutes.Greater than 50% of this time was spent in counseling, explanation of  diagnosis, planning of further management, and coordination of care  [facility] Pager [phone]  If 7PM-7AM, please contact night-coverage [email or web address] Password [facility] 05/06/2015, 4:58 PM

## 2015-05-06 NOTE — ED Notes (Signed)
Nurse drawing labs. 

## 2015-05-06 NOTE — ED Notes (Signed)
Patient transported to MRI 

## 2015-05-06 NOTE — ED Provider Notes (Addendum)
CSN: [id number]     Arrival date & time 05/06/15  1021 History   First MD Initiated Contact with Patient 05/06/15 1152     Chief Complaint  Patient presents with  . Loss of Vision  . Cough     (Consider location/radiation/quality/duration/timing/severity/associated sxs/prior Treatment) HPI Comments: Patient here complaining of increasing cough and congestion 2 weeks with fever up to 102 which started last night. Use Tylenol with no relief. Patient had emesis followed by a diffuse headache. Had a sudden onset of a floater in his left visual field without visual field loss. He also notes some dysuria without hematuria. Denies any flank pain. No abdominal pain. Has had transient confusion without neck pain or photophobia. Denies any rashes. Denies any recent use of antibiotics. Patient did have surgery on his right lower extremities for spider veins last week  The history is provided by the patient and a significant other.    Past Medical History  Diagnosis Date  . Diabetes mellitus without complication (HCC)   . Allergy    Past Surgical History  Procedure Laterality Date  . Laser ablation Left 02/03/15  . Varicose vein surgery     Family History  Problem Relation Age of Onset  . Parkinson's disease Father   . Heart attack Maternal Grandmother    Social History  Substance Use Topics  . Smoking status: Never Smoker   . Smokeless tobacco: Never Used  . Alcohol Use: No    Review of Systems  All other systems reviewed and are negative.     Allergies  Latex  Home Medications   Prior to Admission medications   Medication Sig Start Date End Date Taking? Authorizing Provider  acetaminophen (TYLENOL) 500 MG tablet Take 1,000 mg by mouth every 6 (six) hours as needed for mild pain or moderate pain.   Yes Historical Provider, MD  aspirin EC 81 MG tablet Take 81 mg by mouth daily.   Yes Historical Provider, MD  dapagliflozin propanediol (FARXIGA) 10 MG TABS tablet Take 10 mg  by mouth daily.   Yes Historical Provider, MD  ibuprofen (ADVIL,MOTRIN) 200 MG tablet Take 400 mg by mouth every 6 (six) hours as needed for headache or moderate pain.   Yes Historical Provider, MD  metFORMIN (GLUCOPHAGE-XR) 500 MG 24 hr tablet Take 1,000 mg by mouth 2 (two) times daily.   Yes Historical Provider, MD  Multiple Vitamin (MULTIVITAMIN WITH MINERALS) TABS tablet Take 2 tablets by mouth daily. Mega men 50 plus gnc   Yes Historical Provider, MD  OVER THE COUNTER MEDICATION Take 2 capsules by mouth daily. apexatropin supplement   Yes Historical Provider, MD  OVER THE COUNTER MEDICATION Take 1 tablet by mouth daily as needed (sex drive). Over the counter sex drive medication   Yes Historical Provider, MD  ramipril (ALTACE) 5 MG capsule Take 5 mg by mouth daily.   Yes Historical Provider, MD  Testosterone (ANDROGEL) 20.25 MG/1.25GM (1.62%) GEL Place 2 application onto the skin daily. 2 pumps on each shoulder daily   Yes Historical Provider, MD   BP 117/83 mmHg  Pulse 78  Temp(Src) 98.7 F (37.1 C) (Oral)  Resp 15  SpO2 93% Physical Exam  Constitutional: He is oriented to person, place, and time. He appears well-developed and well-nourished.  Non-toxic appearance. No distress.  HENT:  Head: Normocephalic and atraumatic.  Eyes: Conjunctivae, EOM and lids are normal. Pupils are equal, round, and reactive to light.  No visual field defects noted  Neck: Normal  range of motion. Neck supple. No rigidity. No tracheal deviation and normal range of motion present. No thyroid mass present.  Cardiovascular: Normal rate, regular rhythm and normal heart sounds.  Exam reveals no gallop.   No murmur heard. Pulmonary/Chest: Effort normal. No stridor. No respiratory distress. He has decreased breath sounds. He has wheezes. He has no rhonchi. He has no rales.  Abdominal: Soft. Normal appearance and bowel sounds are normal. He exhibits no distension. There is no tenderness. There is no rebound and no  CVA tenderness.  Musculoskeletal: Normal range of motion. He exhibits no edema or tenderness.  Neurological: He is alert and oriented to person, place, and time. He has normal strength. No cranial nerve deficit or sensory deficit. GCS eye subscore is 4. GCS verbal subscore is 5. GCS motor subscore is 6.  Skin: Skin is warm and dry. No abrasion and no rash noted.  Psychiatric: He has a normal mood and affect. His speech is normal and behavior is normal.  Nursing note and vitals reviewed.   ED Course  Procedures (including critical care time) Labs Review Labs Reviewed  CULTURE, BLOOD (ROUTINE X 2)  CULTURE, BLOOD (ROUTINE X 2)  URINE CULTURE  URINALYSIS, ROUTINE W REFLEX MICROSCOPIC (NOT AT [facility])  CBC WITH DIFFERENTIAL/PLATELET  COMPREHENSIVE METABOLIC PANEL  I-STAT CG4 LACTIC ACID, ED    Imaging Review No results found. I have personally reviewed and evaluated these images and lab results as part of my medical decision-making.   EKG Interpretation   Date/Time:  Tuesday May 06 2015 14:48:19 EST Ventricular Rate:  70 PR Interval:  134 QRS Duration: 105 QT Interval:  403 QTC Calculation: 435 R Axis:   -18 Text Interpretation:  Sinus rhythm Left atrial enlargement Borderline left  axis deviation RSR' in V1 or V2, probably normal variant No significant  change since last tracing Confirmed by Kaylynn Chamblin  MD, Kishaun Erekson ([id number]) on  05/06/2015 3:02:37 PM      MDM   Final diagnoses:  SOB (shortness of breath)    Discussed patient's visual complaints with ophthalmologist on call, Dr. Sherryll Burger, and suspect that patient has a posterior vitreous hemorrhage and that this can be followed up on as an outpatient and that he will come see the patient if he would develop floaters that are worsening or flashes of light or loss of vision. Patient's vision has been unchanged during his visit here.  Patient developed some chest pressure here and EKG showed no acute changes. We'll send troponin but  patient did not have any cardiac complaints of initial presentation.  Suspect that patient's overall symptoms are likely to influenza. Could also have early pneumonia. Started on IV antibiotics and admitted to the hospitalist service    Lorre Nick, MD 05/06/15 [id number]  Lorre Nick, MD 05/06/15 1511

## 2015-05-06 NOTE — ED Notes (Signed)
Patient alert, oriented, answering questions appropriately.

## 2015-05-06 NOTE — ED Notes (Addendum)
[phone] Cordelia Pen (Girlfriend) Please call if room assignment is made

## 2015-05-07 ENCOUNTER — Inpatient Hospital Stay (HOSPITAL_COMMUNITY): Payer: BLUE CROSS/BLUE SHIELD

## 2015-05-07 DIAGNOSIS — E119 Type 2 diabetes mellitus without complications: Secondary | ICD-10-CM

## 2015-05-07 DIAGNOSIS — R7881 Bacteremia: Secondary | ICD-10-CM

## 2015-05-07 LAB — URINE CULTURE: Culture: NO GROWTH

## 2015-05-07 LAB — COMPREHENSIVE METABOLIC PANEL
ALK PHOS: 69 U/L (ref 38–126)
ALT: 17 U/L (ref 17–63)
ANION GAP: 12 (ref 5–15)
AST: 18 U/L (ref 15–41)
Albumin: 3.4 g/dL — ABNORMAL LOW (ref 3.5–5.0)
BILIRUBIN TOTAL: 1.2 mg/dL (ref 0.3–1.2)
BUN: 16 mg/dL (ref 6–20)
CALCIUM: 8.6 mg/dL — AB (ref 8.9–10.3)
CO2: 23 mmol/L (ref 22–32)
CREATININE: 0.82 mg/dL (ref 0.61–1.24)
Chloride: 102 mmol/L (ref 101–111)
Glucose, Bld: 167 mg/dL — ABNORMAL HIGH (ref 65–99)
Potassium: 3.6 mmol/L (ref 3.5–5.1)
Sodium: 137 mmol/L (ref 135–145)
TOTAL PROTEIN: 6.5 g/dL (ref 6.5–8.1)

## 2015-05-07 LAB — CBC
HCT: 45.6 % (ref 39.0–52.0)
HEMOGLOBIN: 15.4 g/dL (ref 13.0–17.0)
MCH: 31.8 pg (ref 26.0–34.0)
MCHC: 33.8 g/dL (ref 30.0–36.0)
MCV: 94 fL (ref 78.0–100.0)
PLATELETS: 139 10*3/uL — AB (ref 150–400)
RBC: 4.85 MIL/uL (ref 4.22–5.81)
RDW: 12.8 % (ref 11.5–15.5)
WBC: 6.2 10*3/uL (ref 4.0–10.5)

## 2015-05-07 LAB — TROPONIN I: Troponin I: <0.03 ng/mL (ref <0.031)

## 2015-05-07 LAB — GLUCOSE, CAPILLARY
Glucose-Capillary: 152 mg/dL — ABNORMAL HIGH (ref 65–99)
Glucose-Capillary: 144 mg/dL — ABNORMAL HIGH (ref 65–99)
Glucose-Capillary: 119 mg/dL — ABNORMAL HIGH (ref 65–99)

## 2015-05-07 MED ORDER — INSULIN ASPART 100 UNIT/ML ~~LOC~~ SOLN
0.0000 [IU] | Freq: Three times a day (TID) | SUBCUTANEOUS | Status: DC
  Administered 2015-05-07: 3 [IU] via SUBCUTANEOUS
  Administered 2015-05-07: 2 [IU] via SUBCUTANEOUS
  Administered 2015-05-08: 5 [IU] via SUBCUTANEOUS
  Administered 2015-05-08: 3 [IU] via SUBCUTANEOUS
  Administered 2015-05-09: 2 [IU] via SUBCUTANEOUS

## 2015-05-07 MED ORDER — AZITHROMYCIN 250 MG PO TABS: ORAL_TABLET | ORAL | Status: DC

## 2015-05-07 MED ORDER — TRAMADOL HCL 50 MG PO TABS
50.0000 mg | ORAL_TABLET | Freq: Four times a day (QID) | ORAL | Status: DC | PRN
  Administered 2015-05-07 – 2015-05-08 (×2): 50 mg via ORAL
  Filled 2015-05-07 (×2): qty 1

## 2015-05-07 MED ORDER — VANCOMYCIN HCL 10 G IV SOLR
2000.0000 mg | INTRAVENOUS | Status: AC
  Administered 2015-05-07: 2000 mg via INTRAVENOUS
  Filled 2015-05-07 (×2): qty 2000

## 2015-05-07 MED ORDER — VANCOMYCIN HCL IN DEXTROSE 1-5 GM/200ML-% IV SOLN
1000.0000 mg | Freq: Two times a day (BID) | INTRAVENOUS | Status: DC
  Administered 2015-05-08 (×2): 1000 mg via INTRAVENOUS
  Filled 2015-05-07 (×2): qty 200

## 2015-05-07 MED ORDER — CEFDINIR 300 MG PO CAPS: 300.0000 mg | ORAL_CAPSULE | Freq: Two times a day (BID) | ORAL | Status: DC

## 2015-05-07 MED ORDER — DIPHENHYDRAMINE HCL 12.5 MG/5ML PO ELIX
12.5000 mg | ORAL_SOLUTION | Freq: Every day | ORAL | Status: DC | PRN
  Administered 2015-05-07: 12.5 mg via ORAL
  Filled 2015-05-07: qty 5

## 2015-05-07 NOTE — ED Notes (Signed)
Pt in no distress. Talking to girlfriend on phone.   C/c generalized itchiness after receiving dilaudid. Will page MD for benadryl order.

## 2015-05-07 NOTE — ED Notes (Signed)
Patient given water and sprite to drink.  

## 2015-05-07 NOTE — Discharge Summary (Addendum)
Physician Discharge Summary  [name]:[id number] DOB: 1960-07-18 DOA: 05/06/2015  PCP: Michiel Sites, MD  Admit date: 05/06/2015 Discharge date: 05/07/2015  Time spent: > 35 minutes  Recommendations for Outpatient Follow-up:  1. Will hold your aspirin until after you are cleared to continue taking it by your pcp or ophthalmologist 2. Continue to monitor patient's blood sugars and adjust hypoglycemic agents accordingly 3. Patient will be treated for pneumonia/acute bronchitis for 7 days of antibiotics 4. Addendum: patient had esophageal dilation. Please have patient f/u with GI  Discharge Diagnoses:  Active Problems:   Acute bronchitis   Discharge Condition: Stable  Diet recommendation: Diabetic diet  There were no vitals filed for this visit.  History of present illness:  From original HPI: 55 year old male with history of diabetes, varicose veins, status post Successful laser ablation right great saphenous vein with multiple stab phlebectomy of painful varicosities by Dr. Hart Rochester, who presents to the ER today brought in by the patient's girlfriend for altered mental status, fever. The patient has not been feeling well for 2-3 weeks, apparently fell on 1/13. Fall not associated with any trauma the patient was not seen in the ER, patient presents today with productive spasmodic cough, associated with streaks of blood. Patient coughed so hard that he had some associated nausea vomiting again mixed with streaks of blood, after the coughing spell the patient noticed a floater in his left eye, he also has a global headache, somewhat confused last night and was brought into the ER by his girlfriend.  Hospital Course:  Acute bronchitis/Pneumonia - Chest x ray reported no evidence of acute cardiopulmonary disease -improved on cephalosporin will continue for 4 more days to complete a total of 7 days of antibiotic therapy  Floater in left eye - patient reports improvement in  condition still having some floaters - Recommended f/u with opthalmologist on discharge, patient verbalizes understanding and agreement.  Chest pain - Most likely related to principal problem list above - Troponins negative 3  Dysphagia - s/p esophageal dilatation.  Procedures:  None  Consultations:  Gastroenterology  Discharge Exam: Filed Vitals:   05/07/15 0600 05/07/15 0940  BP: 103/74 135/78  Pulse: 70 68  Temp:    Resp: 20 18    General: Patient in no acute distress, alert and awake Cardiovascular: Regular rate and rhythm, no murmurs Respiratory: Clear to auscultation bilaterally, no wheezes  Discharge Instructions   Discharge Instructions    Call MD for:  redness, tenderness, or signs of infection (pain, swelling, redness, odor or green/yellow discharge around incision site)    Complete by:  As directed      Call MD for:  temperature >100.4    Complete by:  As directed      Diet - low sodium heart healthy    Complete by:  As directed      Discharge instructions    Complete by:  As directed   Please follow up with your ophthalmologist (if you don't have once please call and set up appointment with one of your choice) after discharge.     Increase activity slowly    Complete by:  As directed           Current Discharge Medication List    START taking these medications   Details  azithromycin (ZITHROMAX) 250 MG tablet Take one tab by mouth daily Qty: 6 each, Refills: 0    cefdinir (OMNICEF) 300 MG capsule Take 1 capsule (300 mg total) by mouth 2 (  two) times daily. Qty: 12 capsule, Refills: 0      CONTINUE these medications which have NOT CHANGED   Details  acetaminophen (TYLENOL) 500 MG tablet Take 1,000 mg by mouth every 6 (six) hours as needed for mild pain or moderate pain.    dapagliflozin propanediol (FARXIGA) 10 MG TABS tablet Take 10 mg by mouth daily.    ibuprofen (ADVIL,MOTRIN) 200 MG tablet Take 400 mg by mouth every 6 (six) hours as  needed for headache or moderate pain.    metFORMIN (GLUCOPHAGE-XR) 500 MG 24 hr tablet Take 1,000 mg by mouth 2 (two) times daily.    Multiple Vitamin (MULTIVITAMIN WITH MINERALS) TABS tablet Take 2 tablets by mouth daily. Mega men 50 plus gnc    !! OVER THE COUNTER MEDICATION Take 2 capsules by mouth daily. apexatropin supplement    !! OVER THE COUNTER MEDICATION Take 1 tablet by mouth daily as needed (sex drive). Over the counter sex drive medication    ramipril (ALTACE) 5 MG capsule Take 5 mg by mouth daily.    Testosterone (ANDROGEL) 20.25 MG/1.25GM (1.62%) GEL Place 2 application onto the skin daily. 2 pumps on each shoulder daily     !! - Potential duplicate medications found. Please discuss with provider.    STOP taking these medications     aspirin EC 81 MG tablet        Allergies  Allergen Reactions  . Latex Rash      The results of significant diagnostics from this hospitalization (including imaging, microbiology, ancillary and laboratory) are listed below for reference.    Significant Diagnostic Studies: Dg Chest 2 View  05/06/2015  CLINICAL DATA:  Shortness of breath EXAM: CHEST  2 VIEW COMPARISON:  09/21/2012 FINDINGS: Normal heart size and mediastinal contours. No acute infiltrate or edema. No effusion or pneumothorax. No acute osseous findings. IMPRESSION: No evidence of acute cardiopulmonary disease. Electronically Signed   By: Marnee Spring M.D.   On: 05/06/2015 12:27   Mr Brain Wo Contrast  05/06/2015  CLINICAL DATA:  Headache. Left eye floater. Confusion last night. Cough with fever earlier in the week. EXAM: MRI HEAD WITHOUT CONTRAST TECHNIQUE: Multiplanar, multiecho pulse sequences of the brain and surrounding structures were obtained without intravenous contrast. COMPARISON:  Head CT 09/22/2012 FINDINGS: There is no evidence of acute infarct, intracranial hemorrhage, mass, midline shift, or extra-axial fluid collection. Slight cerebral atrophy is noted in  the parietal regions. There are a few punctate foci in the subcortical cerebral white matter which are nonspecific and not greater than expected for patient's age. Orbits are unremarkable. Paranasal sinuses are clear. There is a small left mastoid effusion. Major intracranial vascular flow voids are preserved. IMPRESSION: No acute intracranial abnormality. Electronically Signed   By: Sebastian Ache M.D.   On: 05/06/2015 20:03    Microbiology: Recent Results (from the past 240 hour(s))  Urine culture     Status: None (Preliminary result)   Collection Time: 05/06/15 12:31 PM  Result Value Ref Range Status   Specimen Description URINE, CLEAN CATCH  Final   Special Requests NONE  Final   Culture   Final    NO GROWTH < 24 HOURS Performed at [facility]    Report Status PENDING  Incomplete     Labs: Basic Metabolic Panel:  Recent Labs Lab 05/06/15 1220 05/06/15 1520 05/07/15 0540  NA 138  --  137  K 3.9  --  3.6  CL 102  --  102  CO2 25  --  23  GLUCOSE 166*  --  167*  BUN 17  --  16  CREATININE 0.96  --  0.82  CALCIUM 9.2  --  8.6*  MG  --  1.8  --    Liver Function Tests:  Recent Labs Lab 05/06/15 1220 05/07/15 0540  AST 19 18  ALT 18 17  ALKPHOS 82 69  BILITOT 1.7* 1.2  PROT 7.5 6.5  ALBUMIN 4.1 3.4*   No results for input(s): LIPASE, AMYLASE in the last 168 hours. No results for input(s): AMMONIA in the last 168 hours. CBC:  Recent Labs Lab 05/06/15 1220 05/07/15 0540  WBC 7.7 6.2  NEUTROABS 5.0  --   HGB 16.8 15.4  HCT 49.2 45.6  MCV 93.5 94.0  PLT 155 139*   Cardiac Enzymes:  Recent Labs Lab 05/06/15 1520 05/06/15 1709 05/06/15 2310  TROPONINI <0.03 <0.03 <0.03   BNP: BNP (last 3 results) No results for input(s): BNP in the last 8760 hours.  ProBNP (last 3 results) No results for input(s): PROBNP in the last 8760 hours.  CBG: No results for input(s): GLUCAP in the last 168 hours.   Signed:  [name] MD.  [facility] 05/07/2015, 9:45 AM

## 2015-05-07 NOTE — ED Notes (Signed)
Pt can go up at 11:15

## 2015-05-07 NOTE — ED Notes (Signed)
admitting Dr at bedside, pt possibly not getting d/c home.

## 2015-05-07 NOTE — Progress Notes (Signed)
ANTIBIOTIC CONSULT NOTE - INITIAL  Pharmacy Consult for vancomycin Indication: rule out bacteremia  Allergies  Allergen Reactions  . Latex Rash    Patient Measurements:    Vital Signs: BP: 132/86 mmHg (01/18 1033) Pulse Rate: 71 (01/18 1033) Intake/Output from previous day:   Intake/Output from this shift:    Labs:  Recent Labs  05/06/15 1220 05/07/15 0540  WBC 7.7 6.2  HGB 16.8 15.4  PLT 155 139*  CREATININE 0.96 0.82   Estimated Creatinine Clearance: 133.5 mL/min (by C-G formula based on Cr of 0.82). No results for input(s): VANCOTROUGH, VANCOPEAK, VANCORANDOM, GENTTROUGH, GENTPEAK, GENTRANDOM, TOBRATROUGH, TOBRAPEAK, TOBRARND, AMIKACINPEAK, AMIKACINTROU, AMIKACIN in the last 72 hours.   Microbiology: Recent Results (from the past 720 hour(s))  Culture, blood (Routine X 2) w Reflex to ID Panel     Status: None (Preliminary result)   Collection Time: 05/06/15 12:25 PM  Result Value Ref Range Status   Specimen Description BLOOD RIGHT ANTECUBITAL  Final   Special Requests BOTTLES DRAWN AEROBIC AND ANAEROBIC 10CC EACH  Final   Culture  Setup Time   Final    GRAM POSITIVE COCCI IN CLUSTERS AEROBIC BOTTLE ONLY CRITICAL RESULT CALLED TO, READ BACK BY AND VERIFIED WITH: S WEST,RN 1047 05/07/15 BY L BENFIELD    Culture   Final    GRAM POSITIVE COCCI Performed at [facility]    Report Status PENDING  Incomplete  Urine culture     Status: None (Preliminary result)   Collection Time: 05/06/15 12:31 PM  Result Value Ref Range Status   Specimen Description URINE, CLEAN CATCH  Final   Special Requests NONE  Final   Culture   Final    NO GROWTH < 24 HOURS Performed at [facility]    Report Status PENDING  Incomplete  Culture, blood (Routine X 2) w Reflex to ID Panel     Status: None (Preliminary result)   Collection Time: 05/06/15 12:50 PM  Result Value Ref Range Status   Specimen Description BLOOD RIGHT FOREARM  Final   Special Requests BOTTLES  DRAWN AEROBIC AND ANAEROBIC 5CC  Final   Culture   Final    NO GROWTH < 24 HOURS Performed at [facility]    Report Status PENDING  Incomplete    Medical History: Past Medical History  Diagnosis Date  . Diabetes mellitus without complication (HCC)   . Allergy     Medications:  Scheduled:  . docusate sodium  100 mg Oral BID  . enoxaparin (LOVENOX) injection  40 mg Subcutaneous Q24H  . sodium chloride  3 mL Intravenous Q12H   Infusions:  . sodium chloride 125 mL/hr at 05/06/15 1248  . cefTRIAXone (ROCEPHIN)  IV Stopped (05/06/15 1638)   Assessment: 55 yo presented to ER 1/17 with cough and lost of vision started on Zithromax and Rocephin for acute bronchitis. Today 1/18, 1 blood culture growing GPC in clusters so will start vancomycin per pharmacy pending final results. Afeb, WBC and SCr WNL at baseline  Goal of Therapy:  Vancomycin trough level 15-20 mcg/ml  Plan:  1) Vancomycin 2g IV x 1 then 1g IV q12 based on current weight and renal function 2) will adjust Rocephin to 2g IV q24 per policy   Hessie Knows, PharmD, BCPS Pager [phone] 05/07/2015 11:03 AM

## 2015-05-07 NOTE — Progress Notes (Signed)
[facility] PROGRESS NOTE  [name] FAO:[id number] DOB: 1961-03-13 DOA: 05/06/2015 PCP: Michiel Sites, MD  Assessment/Plan: Principal Problem:   Acute bronchitis - Will continue rocephin - Given positive blood culture will place on Vancomycin - Vital signs reassuring and WBC count within normal limits  Active Problems:   Bacteremia - Currently preliminary results showing gram-positive cocci. We'll place on vancomycin and will await final sensitivity results. This may be contaminant but we'll have to monitor patient until we know for sure.    Diabetes (HCC) - Will place on SSI  Headache - currently no red flags reported as far as neurological deficits - MR negative - d/c dilaudid place on tramadol  Code Status: full Family Communication: No family at bedside Disposition Plan: Pending improvement in condition   Consultants:  None  Procedures:  none  Antibiotics:  Ceftriaxone  Vancomycin  HPI/Subjective: Pt has no new complaints  Objective: Filed Vitals:   05/07/15 0940 05/07/15 1033  BP: 135/78 132/86  Pulse: 68 71  Temp:    Resp: 18 18   No intake or output data in the 24 hours ending 05/07/15 1130 There were no vitals filed for this visit.  Exam:   General:  Pt in nad, alert and awake  Cardiovascular: rrr, no murmurs  Respiratory: no increased wob, no wheezes  Abdomen: soft, NT, ND  Musculoskeletal: no cyanosis or clubbing   Data Reviewed: Basic Metabolic Panel:  Recent Labs Lab 05/06/15 1220 05/06/15 1520 05/07/15 0540  NA 138  --  137  K 3.9  --  3.6  CL 102  --  102  CO2 25  --  23  GLUCOSE 166*  --  167*  BUN 17  --  16  CREATININE 0.96  --  0.82  CALCIUM 9.2  --  8.6*  MG  --  1.8  --    Liver Function Tests:  Recent Labs Lab 05/06/15 1220 05/07/15 0540  AST 19 18  ALT 18 17  ALKPHOS 82 69  BILITOT 1.7* 1.2  PROT 7.5 6.5  ALBUMIN 4.1 3.4*   No results for input(s): LIPASE, AMYLASE in the last  168 hours. No results for input(s): AMMONIA in the last 168 hours. CBC:  Recent Labs Lab 05/06/15 1220 05/07/15 0540  WBC 7.7 6.2  NEUTROABS 5.0  --   HGB 16.8 15.4  HCT 49.2 45.6  MCV 93.5 94.0  PLT 155 139*   Cardiac Enzymes:  Recent Labs Lab 05/06/15 1520 05/06/15 1709 05/06/15 2310  TROPONINI <0.03 <0.03 <0.03   BNP (last 3 results) No results for input(s): BNP in the last 8760 hours.  ProBNP (last 3 results) No results for input(s): PROBNP in the last 8760 hours.  CBG: No results for input(s): GLUCAP in the last 168 hours.  Recent Results (from the past 240 hour(s))  Culture, blood (Routine X 2) w Reflex to ID Panel     Status: None (Preliminary result)   Collection Time: 05/06/15 12:25 PM  Result Value Ref Range Status   Specimen Description BLOOD RIGHT ANTECUBITAL  Final   Special Requests BOTTLES DRAWN AEROBIC AND ANAEROBIC 10CC EACH  Final   Culture  Setup Time   Final    GRAM POSITIVE COCCI IN CLUSTERS AEROBIC BOTTLE ONLY CRITICAL RESULT CALLED TO, READ BACK BY AND VERIFIED WITH: S WEST,RN 1047 05/07/15 BY L BENFIELD    Culture   Final    GRAM POSITIVE COCCI Performed at [facility]    Report  Status PENDING  Incomplete  Urine culture     Status: None (Preliminary result)   Collection Time: 05/06/15 12:31 PM  Result Value Ref Range Status   Specimen Description URINE, CLEAN CATCH  Final   Special Requests NONE  Final   Culture   Final    NO GROWTH < 24 HOURS Performed at [facility]    Report Status PENDING  Incomplete  Culture, blood (Routine X 2) w Reflex to ID Panel     Status: None (Preliminary result)   Collection Time: 05/06/15 12:50 PM  Result Value Ref Range Status   Specimen Description BLOOD RIGHT FOREARM  Final   Special Requests BOTTLES DRAWN AEROBIC AND ANAEROBIC 5CC  Final   Culture   Final    NO GROWTH < 24 HOURS Performed at [facility]    Report Status PENDING  Incomplete     Studies: Dg Chest 2  View  05/06/2015  CLINICAL DATA:  Shortness of breath EXAM: CHEST  2 VIEW COMPARISON:  09/21/2012 FINDINGS: Normal heart size and mediastinal contours. No acute infiltrate or edema. No effusion or pneumothorax. No acute osseous findings. IMPRESSION: No evidence of acute cardiopulmonary disease. Electronically Signed   By: Marnee Spring M.D.   On: 05/06/2015 12:27   Mr Brain Wo Contrast  05/06/2015  CLINICAL DATA:  Headache. Left eye floater. Confusion last night. Cough with fever earlier in the week. EXAM: MRI HEAD WITHOUT CONTRAST TECHNIQUE: Multiplanar, multiecho pulse sequences of the brain and surrounding structures were obtained without intravenous contrast. COMPARISON:  Head CT 09/22/2012 FINDINGS: There is no evidence of acute infarct, intracranial hemorrhage, mass, midline shift, or extra-axial fluid collection. Slight cerebral atrophy is noted in the parietal regions. There are a few punctate foci in the subcortical cerebral white matter which are nonspecific and not greater than expected for patient's age. Orbits are unremarkable. Paranasal sinuses are clear. There is a small left mastoid effusion. Major intracranial vascular flow voids are preserved. IMPRESSION: No acute intracranial abnormality. Electronically Signed   By: Sebastian Ache M.D.   On: 05/06/2015 20:03    Scheduled Meds: . cefTRIAXone (ROCEPHIN)  IV  1 g Intravenous Q24H  . docusate sodium  100 mg Oral BID  . enoxaparin (LOVENOX) injection  40 mg Subcutaneous Q24H  . sodium chloride  3 mL Intravenous Q12H  . vancomycin  2,000 mg Intravenous STAT  . [START ON 05/08/2015] vancomycin  1,000 mg Intravenous Q12H   Continuous Infusions: . sodium chloride Stopped (05/07/15 1104)    Time spent: > 35 minutes    [name]  [facility] Pager [phone] If 7PM-7AM, please contact night-coverage at [email or web address], password [facility] 05/07/2015, 11:30 AM  LOS: 1 day

## 2015-05-07 NOTE — ED Notes (Signed)
Paged the admitting provider , Dr Dorene Sorrow for Benadryl request due to pt's  reaction to dilauded. Pt reported itching to prior nurse. Pt is still co itching . Spoke to Dr Dorene Sorrow who was going to order benadryl PRN, yet changed his order to not give anything to pt until he comes to ED and consult pt. Spouse very upset about provider decision and requests to speak to charge nurse. CN made aware. ED provider made aware as well.

## 2015-05-08 DIAGNOSIS — R51 Headache: Secondary | ICD-10-CM

## 2015-05-08 DIAGNOSIS — J209 Acute bronchitis, unspecified: Secondary | ICD-10-CM

## 2015-05-08 DIAGNOSIS — K219 Gastro-esophageal reflux disease without esophagitis: Secondary | ICD-10-CM

## 2015-05-08 DIAGNOSIS — R1314 Dysphagia, pharyngoesophageal phase: Secondary | ICD-10-CM

## 2015-05-08 DIAGNOSIS — E1165 Type 2 diabetes mellitus with hyperglycemia: Secondary | ICD-10-CM

## 2015-05-08 LAB — GLUCOSE, CAPILLARY
GLUCOSE-CAPILLARY: 111 mg/dL — AB (ref 65–99)
GLUCOSE-CAPILLARY: 146 mg/dL — AB (ref 65–99)
Glucose-Capillary: 153 mg/dL — ABNORMAL HIGH (ref 65–99)
Glucose-Capillary: 206 mg/dL — ABNORMAL HIGH (ref 65–99)

## 2015-05-08 MED ORDER — TRAMADOL HCL 50 MG PO TABS
100.0000 mg | ORAL_TABLET | Freq: Four times a day (QID) | ORAL | Status: DC | PRN
  Administered 2015-05-08 – 2015-05-09 (×2): 100 mg via ORAL
  Filled 2015-05-08 (×2): qty 2

## 2015-05-08 MED ORDER — PANTOPRAZOLE SODIUM 40 MG PO TBEC
40.0000 mg | DELAYED_RELEASE_TABLET | Freq: Every day | ORAL | Status: DC
  Administered 2015-05-09: 40 mg via ORAL
  Filled 2015-05-08: qty 1

## 2015-05-08 NOTE — Progress Notes (Signed)
Pharmacy Antibiotic Follow-up Note  [name] is a 55 y.o. year-old male admitted on 05/06/2015.  The patient is currently on day #2 vancomycin for GPC clusters in blood culture 1/2. Remains on ceftriaxone for bronchitis.   Assessment/Plan:  Blood culture remains ConS x 1 and BGTD x 2 days, ask [facility] to stop vancomycin.  Dr. Cena Benton agrees can stop vancomycin for likely contaminant.   Temp (24hrs), Avg:98 F (36.7 C), Min:97.7 F (36.5 C), Max:98.6 F (37 C)   Recent Labs Lab 05/06/15 1220 05/07/15 0540  WBC 7.7 6.2    Recent Labs Lab 05/06/15 1220 05/07/15 0540  CREATININE 0.96 0.82   Estimated Creatinine Clearance: 123.5 mL/min (by C-G formula based on Cr of 0.82).    Allergies  Allergen Reactions  . Latex Rash    1/17 >> Zithromax >> 1/18  1/17 >> Rocephin per Md >>  1/18 >> vanc >>   1/17 blood: CoNS 1/2  1/17 urine: NG  1/17 flu: neg   Thank you for allowing pharmacy to be a part of this patient's care.  Dannielle Huh PharmD 05/08/2015 3:26 PM

## 2015-05-08 NOTE — Consult Note (Signed)
Consultation  Referring Provider: [facility] Primary Care Physician:  Michiel Sites, MD Primary Gastroenterologist:  none  Reason for Consultation:  Dysphagia  HPI: [name] is a 55 y.o. male  admitted through the emergency room on 05/06/2015 brought in by his girlfriend with complaints of cough fever and altered mental status. He had been sick for couple of weeks with an upper respiratory infection. Apparently had been coughing very hard and sometimes coughing so hard he was vomiting. He complained of headache as well. [name] been on IV antibiotics since admission, chest x-ray was negative. One of 2 blood cultures growing gram-positive cocci. He has been hemodynamically stable and has not required oxygen. He is feeling better today. GI is asked to see him regarding complaints of dysphagia. He says she's been having difficulty swallowing over the past year with gradual progression. He says it's basically happening every time he eats at this point and that he has to cut his food up very very small and chew slowly. He denies any difficulty swallowing liquids. He specifically says he has difficulty with meats and has had to regurgitate to dislodge food. He says he frequently has to stop eating to allow food to go down. He has not been losing any weight, denies any abdominal pain. He does have intermittent problems with heartburn and reflux with belching of acidic material. He says several years ago he had been on an acid blocker but lost his insurance and has not been on anything recently. Patient has not had any prior EGD but says he did have remote colonoscopy by Dr. Madilyn Fireman. He says he believes he was told it was negative.   Past Medical History  Diagnosis Date  . Diabetes mellitus without complication (HCC)   . Allergy     Past Surgical History  Procedure Laterality Date  . Laser ablation Left 02/03/15  . Varicose vein surgery      Prior to Admission  medications   Medication Sig Start Date End Date Taking? Authorizing Provider  acetaminophen (TYLENOL) 500 MG tablet Take 1,000 mg by mouth every 6 (six) hours as needed for mild pain or moderate pain.   Yes Historical Provider, MD  aspirin EC 81 MG tablet Take 81 mg by mouth daily.   Yes Historical Provider, MD  dapagliflozin propanediol (FARXIGA) 10 MG TABS tablet Take 10 mg by mouth daily.   Yes Historical Provider, MD  ibuprofen (ADVIL,MOTRIN) 200 MG tablet Take 400 mg by mouth every 6 (six) hours as needed for headache or moderate pain.   Yes Historical Provider, MD  metFORMIN (GLUCOPHAGE-XR) 500 MG 24 hr tablet Take 1,000 mg by mouth 2 (two) times daily.   Yes Historical Provider, MD  Multiple Vitamin (MULTIVITAMIN WITH MINERALS) TABS tablet Take 2 tablets by mouth daily. Mega men 50 plus gnc   Yes Historical Provider, MD  OVER THE COUNTER MEDICATION Take 2 capsules by mouth daily. apexatropin supplement   Yes Historical Provider, MD  OVER THE COUNTER MEDICATION Take 1 tablet by mouth daily as needed (sex drive). Over the counter sex drive medication   Yes Historical Provider, MD  ramipril (ALTACE) 5 MG capsule Take 5 mg by mouth daily.   Yes Historical Provider, MD  Testosterone (ANDROGEL) 20.25 MG/1.25GM (1.62%) GEL Place 2 application onto the skin daily. 2 pumps on each shoulder daily   Yes Historical Provider, MD  azithromycin (ZITHROMAX) 250 MG tablet Take one tab by mouth daily 05/07/15   [name], MD  cefdinir (OMNICEF) 300 MG capsule Take 1 capsule (300 mg total) by mouth 2 (two) times daily. 05/07/15   [name], MD    Current Facility-Administered Medications  Medication Dose Route Frequency Provider Last Rate Last Dose  . 0.9 %  sodium chloride infusion   Intravenous Continuous Lorre Nick, MD 125 mL/hr at 05/07/15 1201    . acetaminophen (TYLENOL) tablet 650 mg  650 mg Oral Q6H PRN Richarda Overlie, MD   650 mg at 05/07/15 1530   Or  . acetaminophen (TYLENOL) suppository  650 mg  650 mg Rectal Q6H PRN Richarda Overlie, MD      . cefTRIAXone (ROCEPHIN) 1 g in dextrose 5 % 50 mL IVPB  1 g Intravenous Q24H Lorre Nick, MD 100 mL/hr at 05/07/15 1525 1 g at 05/07/15 1525  . diphenhydrAMINE (BENADRYL) 12.5 MG/5ML elixir 12.5 mg  12.5 mg Oral Daily PRN [name], MD   12.5 mg at 05/07/15 0939  . docusate sodium (COLACE) capsule 100 mg  100 mg Oral BID Richarda Overlie, MD   100 mg at 05/07/15 2035  . enoxaparin (LOVENOX) injection 40 mg  40 mg Subcutaneous Q24H Richarda Overlie, MD   40 mg at 05/07/15 2035  . insulin aspart (novoLOG) injection 0-15 Units  0-15 Units Subcutaneous TID WC [name], MD   3 Units at 05/08/15 [phone]  . levalbuterol (XOPENEX) nebulizer solution 0.63 mg  0.63 mg Nebulization Q6H PRN Richarda Overlie, MD      . ondansetron (ZOFRAN) tablet 4 mg  4 mg Oral Q6H PRN Richarda Overlie, MD       Or  . ondansetron (ZOFRAN) injection 4 mg  4 mg Intravenous Q6H PRN Richarda Overlie, MD      . sodium chloride 0.9 % injection 3 mL  3 mL Intravenous Q12H Richarda Overlie, MD   3 mL at 05/07/15 2036  . traMADol (ULTRAM) tablet 100 mg  100 mg Oral Q6H PRN [name], MD      . vancomycin (VANCOCIN) IVPB 1000 mg/200 mL premix  1,000 mg Intravenous Q12H Hessie Knows, RPH   1,000 mg at 05/08/15 0216    Allergies as of 05/06/2015 - Review Complete 05/06/2015  Allergen Reaction Noted  . Latex Rash 05/06/2015    Family History  Problem Relation Age of Onset  . Parkinson's disease Father   . Heart attack Maternal Grandmother     Social History   Social History  . Marital Status: Divorced    Spouse Name: N/A  . Number of Children: N/A  . Years of Education: N/A   Occupational History  . Not on file.   Social History Main Topics  . Smoking status: Never Smoker   . Smokeless tobacco: Never Used  . Alcohol Use: No  . Drug Use: No  . Sexual Activity: No   Other Topics Concern  . Not on file   Social History Narrative    Review of Systems: Pertinent positive  and negative review of systems were noted in the above HPI section.  All other review of systems was otherwise negative.  Physical Exam: Vital signs in last 24 hours: Temp:  [97.7 F (36.5 C)] 97.7 F (36.5 C) (01/19 0553) Pulse Rate:  [58-62] 58 (01/19 0553) Resp:  [16-18] 16 (01/19 0553) BP: (106-116)/(66-68) 116/68 mmHg (01/19 0553) SpO2:  [95 %-98 %] 98 % (01/19 0553) Last BM Date: 05/05/15 General:   Alert,  Well-developed, well-nourished, WM pleasant and cooperative in NAD Head:  Normocephalic and  atraumatic. Eyes:  Sclera clear, no icterus.   Conjunctiva pink. Ears:  Normal auditory acuity. Nose:  No deformity, discharge,  or lesions. Mouth:  No deformity or lesions.   Neck:  Supple; no masses or thyromegaly. Lungs:  Scattered rhonchi   Heart:  Regular rate and rhythm; no murmurs, clicks, rubs,  or gallops. Abdomen:  Soft,nontender, BS active,nonpalp mass or hsm.   Rectal:  Deferred  Msk:  Symmetrical without gross deformities. . Pulses:  Normal pulses noted. Extremities:  Without clubbing or edema. Neurologic:  Alert and  oriented x4;  grossly normal neurologically. Skin:  Intact without significant lesions or rashes.. Psych:  Alert and cooperative. Normal mood and affect.  Intake/Output from previous day: 01/18 0701 - 01/19 0700 In: 922 [I.V.:872; IV Piggyback:50] Out: 2000 [Urine:2000] Intake/Output this shift: Total I/O In: 442 [P.O.:442] Out: 1150 [Urine:1150]  Lab Results:  Recent Labs  05/06/15 1220 05/07/15 0540  WBC 7.7 6.2  HGB 16.8 15.4  HCT 49.2 45.6  PLT 155 139*   BMET  Recent Labs  05/06/15 1220 05/07/15 0540  NA 138 137  K 3.9 3.6  CL 102 102  CO2 25 23  GLUCOSE 166* 167*  BUN 17 16  CREATININE 0.96 0.82  CALCIUM 9.2 8.6*   LFT  Recent Labs  05/07/15 0540  PROT 6.5  ALBUMIN 3.4*  AST 18  ALT 17  ALKPHOS 69  BILITOT 1.2    IMPRESSION:  55 yo male with one year hx of solid food dysphagia,progressive, intermittent  heartburn. Symptoms consistent with chronic GERD and peptic stricture. #2 Bronchitis #3 ?bacteremia #4 AODM #5 hx subarachnoid hemorrhage 2014  PLAN: Have scheduled for EGD with probable esophageal dilation with Dr Leone Payor tomorrow 05/09/2015. Procedure discussed in detail with the patient and he is agreeable to proceed. Start daily PPI Hold Lovenox until post esophageal dilation.  Amy Esterwood  05/08/2015, 12:13 PM     [facility] Attending   I have taken an interval history, reviewed the chart and examined the patient. I agree with the Advanced Practitioner's note, impression and recommendations.    Intermittent and chronic dysphagia w/ hx GERD EGD, likely esophageal dilation tomorrow. The risks and benefits as well as alternatives of endoscopic procedure(s) have been discussed and reviewed. All questions answered. The patient agrees to proceed.  Iva Boop, MD, [facility] [phone] (pager) [phone] after 5 PM, weekends and holidays  05/08/2015 2:35 PM

## 2015-05-08 NOTE — Progress Notes (Signed)
[facility] PROGRESS NOTE  [name] ZOX:[id number] DOB: [birth year] DOA: 05/06/2015 PCP: Michiel Sites, MD  Assessment/Plan: Principal Problem:   Acute bronchitis - Will continue current IV antibiotics - Vital signs reassuring and WBC count within normal limits - Patient still having chills but is becoming more alert according to family. Most likely transition to oral antibiotics next am with continued improvement.  Active Problems:   Bacteremia - Currently preliminary results suspicious for contaminant. His other blood culture is negative. Will await final result    Diabetes (HCC) - Will place on SSI  Headache - currently no red flags reported as far as neurological deficits - MR negative - d/c dilaudid placed on tramadol  Code Status: full Family Communication: No family at bedside Disposition Plan: Pending improvement in condition   Consultants:  None  Procedures:  none  Antibiotics:  Ceftriaxone  Vancomycin  HPI/Subjective: Pt has no new complaints, no acute issues overnight. He feels better today. Still having HA  Objective: Filed Vitals:   05/07/15 2234 05/08/15 0553  BP: 106/66 116/68  Pulse: 62 58  Temp: 97.7 F (36.5 C) 97.7 F (36.5 C)  Resp: 18 16    Intake/Output Summary (Last 24 hours) at 05/08/15 1058 Last data filed at 05/08/15 0730  Gross per 24 hour  Intake    922 ml  Output   2600 ml  Net  -1678 ml   Filed Weights   05/07/15 1141  Weight: 103.964 kg (229 lb 3.2 oz)    Exam:   General:  Pt in nad, alert and awake  Cardiovascular: rrr, no murmurs  Respiratory: no increased wob, no wheezes  Abdomen: soft, NT, ND  Musculoskeletal: no cyanosis or clubbing   Data Reviewed: Basic Metabolic Panel:  Recent Labs Lab 05/06/15 1220 05/06/15 1520 05/07/15 0540  NA 138  --  137  K 3.9  --  3.6  CL 102  --  102  CO2 25  --  23  GLUCOSE 166*  --  167*  BUN 17  --  16  CREATININE 0.96  --  0.82   CALCIUM 9.2  --  8.6*  MG  --  1.8  --    Liver Function Tests:  Recent Labs Lab 05/06/15 1220 05/07/15 0540  AST 19 18  ALT 18 17  ALKPHOS 82 69  BILITOT 1.7* 1.2  PROT 7.5 6.5  ALBUMIN 4.1 3.4*   No results for input(s): LIPASE, AMYLASE in the last 168 hours. No results for input(s): AMMONIA in the last 168 hours. CBC:  Recent Labs Lab 05/06/15 1220 05/07/15 0540  WBC 7.7 6.2  NEUTROABS 5.0  --   HGB 16.8 15.4  HCT 49.2 45.6  MCV 93.5 94.0  PLT 155 139*   Cardiac Enzymes:  Recent Labs Lab 05/06/15 1520 05/06/15 1709 05/06/15 2310  TROPONINI <0.03 <0.03 <0.03   BNP (last 3 results) No results for input(s): BNP in the last 8760 hours.  ProBNP (last 3 results) No results for input(s): PROBNP in the last 8760 hours.  CBG:  Recent Labs Lab 05/07/15 1206 05/07/15 1717 05/07/15 2232 05/08/15 0743  GLUCAP 152* 144* 119* 153*    Recent Results (from the past 240 hour(s))  Culture, blood (Routine X 2) w Reflex to ID Panel     Status: None (Preliminary result)   Collection Time: 05/06/15 12:25 PM  Result Value Ref Range Status   Specimen Description BLOOD RIGHT ANTECUBITAL  Final   Special Requests BOTTLES DRAWN AEROBIC  AND ANAEROBIC 10CC EACH  Final   Culture  Setup Time   Final    GRAM POSITIVE COCCI IN CLUSTERS AEROBIC BOTTLE ONLY CRITICAL RESULT CALLED TO, READ BACK BY AND VERIFIED WITH: S WEST,RN 1047 05/07/15 BY L BENFIELD    Culture   Final    STAPHYLOCOCCUS SPECIES (COAGULASE NEGATIVE) Performed at [facility]    Report Status PENDING  Incomplete  Urine culture     Status: None   Collection Time: 05/06/15 12:31 PM  Result Value Ref Range Status   Specimen Description URINE, CLEAN CATCH  Final   Special Requests NONE  Final   Culture   Final    NO GROWTH 1 DAY Performed at [facility]    Report Status 05/07/2015 FINAL  Final  Culture, blood (Routine X 2) w Reflex to ID Panel     Status: None (Preliminary result)    Collection Time: 05/06/15 12:50 PM  Result Value Ref Range Status   Specimen Description BLOOD RIGHT FOREARM  Final   Special Requests BOTTLES DRAWN AEROBIC AND ANAEROBIC 5CC  Final   Culture   Final    NO GROWTH < 24 HOURS Performed at [facility]    Report Status PENDING  Incomplete     Studies: Dg Chest 2 View  05/06/2015  CLINICAL DATA:  Shortness of breath EXAM: CHEST  2 VIEW COMPARISON:  09/21/2012 FINDINGS: Normal heart size and mediastinal contours. No acute infiltrate or edema. No effusion or pneumothorax. No acute osseous findings. IMPRESSION: No evidence of acute cardiopulmonary disease. Electronically Signed   By: Marnee Spring M.D.   On: 05/06/2015 12:27   Mr Brain Wo Contrast  05/06/2015  CLINICAL DATA:  Headache. Left eye floater. Confusion last night. Cough with fever earlier in the week. EXAM: MRI HEAD WITHOUT CONTRAST TECHNIQUE: Multiplanar, multiecho pulse sequences of the brain and surrounding structures were obtained without intravenous contrast. COMPARISON:  Head CT 09/22/2012 FINDINGS: There is no evidence of acute infarct, intracranial hemorrhage, mass, midline shift, or extra-axial fluid collection. Slight cerebral atrophy is noted in the parietal regions. There are a few punctate foci in the subcortical cerebral white matter which are nonspecific and not greater than expected for patient's age. Orbits are unremarkable. Paranasal sinuses are clear. There is a small left mastoid effusion. Major intracranial vascular flow voids are preserved. IMPRESSION: No acute intracranial abnormality. Electronically Signed   By: Sebastian Ache M.D.   On: 05/06/2015 20:03    Scheduled Meds: . cefTRIAXone (ROCEPHIN)  IV  1 g Intravenous Q24H  . docusate sodium  100 mg Oral BID  . enoxaparin (LOVENOX) injection  40 mg Subcutaneous Q24H  . insulin aspart  0-15 Units Subcutaneous TID WC  . sodium chloride  3 mL Intravenous Q12H  . vancomycin  1,000 mg Intravenous Q12H    Continuous Infusions: . sodium chloride 125 mL/hr at 05/07/15 1201    Time spent: > 35 minutes    [name]  [facility] Pager [phone] If 7PM-7AM, please contact night-coverage at [email or web address], password [facility] 05/08/2015, 10:58 AM  LOS: 2 days

## 2015-05-09 ENCOUNTER — Encounter (HOSPITAL_COMMUNITY): Payer: Self-pay | Admitting: Anesthesiology

## 2015-05-09 ENCOUNTER — Inpatient Hospital Stay (HOSPITAL_COMMUNITY): Payer: BLUE CROSS/BLUE SHIELD | Admitting: Anesthesiology

## 2015-05-09 ENCOUNTER — Encounter (HOSPITAL_COMMUNITY)
Admission: EM | Disposition: A | Discharge status: Home / Self Care | Payer: Self-pay | Source: Home / Self Care | Attending: Family Medicine

## 2015-05-09 DIAGNOSIS — J208 Acute bronchitis due to other specified organisms: Secondary | ICD-10-CM

## 2015-05-09 DIAGNOSIS — R1310 Dysphagia, unspecified: Secondary | ICD-10-CM

## 2015-05-09 HISTORY — PX: ESOPHAGOGASTRODUODENOSCOPY (EGD) WITH PROPOFOL: SHX5813

## 2015-05-09 LAB — GLUCOSE, CAPILLARY
GLUCOSE-CAPILLARY: 148 mg/dL — AB (ref 65–99)
GLUCOSE-CAPILLARY: 140 mg/dL — AB (ref 65–99)

## 2015-05-09 LAB — CULTURE, BLOOD (ROUTINE X 2)

## 2015-05-09 SURGERY — ESOPHAGOGASTRODUODENOSCOPY (EGD) WITH PROPOFOL
Anesthesia: Monitor Anesthesia Care

## 2015-05-09 MED ORDER — LIDOCAINE HCL (CARDIAC) 20 MG/ML IV SOLN
INTRAVENOUS | Status: DC | PRN
  Administered 2015-05-09: 100 mg via INTRAVENOUS

## 2015-05-09 MED ORDER — PANTOPRAZOLE SODIUM 40 MG PO TBEC: 40.0000 mg | DELAYED_RELEASE_TABLET | Freq: Every day | ORAL | Status: DC

## 2015-05-09 MED ORDER — PROPOFOL 500 MG/50ML IV EMUL
INTRAVENOUS | Status: DC | PRN
  Administered 2015-05-09: 300 ug/kg/min via INTRAVENOUS

## 2015-05-09 MED ORDER — PROPOFOL 500 MG/50ML IV EMUL
INTRAVENOUS | Status: DC | PRN
  Administered 2015-05-09: 70 mg via INTRAVENOUS

## 2015-05-09 MED ORDER — CEFDINIR 300 MG PO CAPS: 300.0000 mg | ORAL_CAPSULE | Freq: Two times a day (BID) | ORAL | Status: DC

## 2015-05-09 MED ORDER — LACTATED RINGERS IV SOLN
INTRAVENOUS | Status: DC
  Administered 2015-05-09: 500 mL via INTRAVENOUS

## 2015-05-09 MED ORDER — LIDOCAINE HCL (CARDIAC) 20 MG/ML IV SOLN
INTRAVENOUS | Status: AC
  Filled 2015-05-09: qty 5

## 2015-05-09 MED ORDER — PROPOFOL 10 MG/ML IV BOLUS
INTRAVENOUS | Status: AC
  Filled 2015-05-09: qty 40

## 2015-05-09 SURGICAL SUPPLY — 15 items

## 2015-05-09 NOTE — Anesthesia Postprocedure Evaluation (Signed)
Anesthesia Post Note  Patient: [name]  Procedure(s) Performed: Procedure(s) (LRB): ESOPHAGOGASTRODUODENOSCOPY (EGD) WITH PROPOFOL (N/A)  Patient location during evaluation: PACU Anesthesia Type: MAC Level of consciousness: awake and alert Pain management: pain level controlled Vital Signs Assessment: post-procedure vital signs reviewed and stable Respiratory status: spontaneous breathing, nonlabored ventilation, respiratory function stable and patient connected to nasal cannula oxygen Cardiovascular status: stable and blood pressure returned to baseline Anesthetic complications: no    Last Vitals:  Filed Vitals:   05/09/15 1221 05/09/15 1325  BP: 100/52 124/77  Pulse: 59 88  Temp: 36.4 C 36.7 C  Resp: 20 18    Last Pain:  Filed Vitals:   05/09/15 1326  PainSc: 8                  Alliah Boulanger J

## 2015-05-09 NOTE — Progress Notes (Signed)
Patient verbalized understanding of discharge instructions. Patient is stable at discharge. 

## 2015-05-09 NOTE — H&P (View-Only) (Signed)
  Consultation  Referring Provider: [facility] hospital[facility] Primary Care Physician:  Ricky DENNIS, MD Primary Gastroenterologist:  none  Reason for Consultation:  Dysphagia  HPI: [name] is a 55 y.o. male  admitted through the emergency room on 05/06/2015 brought in by his girlfriend with complaints of cough fever and altered mental status. He had been sick for couple of weeks with an upper respiratory infection. Apparently had been coughing very hard and sometimes coughing so hard he was vomiting. He complained of headache as well. [name] been on IV antibiotics since admission, chest x-ray was negative. One of 2 blood cultures growing gram-positive cocci. He has been hemodynamically stable and has not required oxygen. He is feeling better today. GI is asked to see him regarding complaints of dysphagia. He says she's been having difficulty swallowing over the past year with gradual progression. He says it's basically happening every time he eats at this point and that he has to cut his food up very very small and chew slowly. He denies any difficulty swallowing liquids. He specifically says he has difficulty with meats and has had to regurgitate to dislodge food. He says he frequently has to stop eating to allow food to go down. He has not been losing any weight, denies any abdominal pain. He does have intermittent problems with heartburn and reflux with belching of acidic material. He says several years ago he had been on an acid blocker but lost his insurance and has not been on anything recently. Patient has not had any prior EGD but says he did have remote colonoscopy by Dr. Hayes. He says he believes he was told it was negative.   Past Medical History  Diagnosis Date  . Diabetes mellitus without complication (HCC)   . Allergy     Past Surgical History  Procedure Laterality Date  . Laser ablation Left 02/03/15  . Varicose vein surgery      Prior to Admission  medications   Medication Sig Start Date End Date Taking? Authorizing Provider  acetaminophen (TYLENOL) 500 MG tablet Take 1,000 mg by mouth every 6 (six) hours as needed for mild pain or moderate pain.   Yes Historical Provider, MD  aspirin EC 81 MG tablet Take 81 mg by mouth daily.   Yes Historical Provider, MD  dapagliflozin propanediol (FARXIGA) 10 MG TABS tablet Take 10 mg by mouth daily.   Yes Historical Provider, MD  ibuprofen (ADVIL,MOTRIN) 200 MG tablet Take 400 mg by mouth every 6 (six) hours as needed for headache or moderate pain.   Yes Historical Provider, MD  metFORMIN (GLUCOPHAGE-XR) 500 MG 24 hr tablet Take 1,000 mg by mouth 2 (two) times daily.   Yes Historical Provider, MD  Multiple Vitamin (MULTIVITAMIN WITH MINERALS) TABS tablet Take 2 tablets by mouth daily. Mega men 50 plus gnc   Yes Historical Provider, MD  OVER THE COUNTER MEDICATION Take 2 capsules by mouth daily. apexatropin supplement   Yes Historical Provider, MD  OVER THE COUNTER MEDICATION Take 1 tablet by mouth daily as needed (sex drive). Over the counter sex drive medication   Yes Historical Provider, MD  ramipril (ALTACE) 5 MG capsule Take 5 mg by mouth daily.   Yes Historical Provider, MD  Testosterone (ANDROGEL) 20.25 MG/1.25GM (1.62%) GEL Place 2 application onto the skin daily. 2 pumps on each shoulder daily   Yes Historical Provider, MD  azithromycin (ZITHROMAX) 250 MG tablet Take one tab by mouth daily 05/07/15   Orlando Vega, MD    cefdinir (OMNICEF) 300 MG capsule Take 1 capsule (300 mg total) by mouth 2 (two) times daily. 05/07/15   Orlando Vega, MD    Current Facility-Administered Medications  Medication Dose Route Frequency Provider Last Rate Last Dose  . 0.9 %  sodium chloride infusion   Intravenous Continuous Anthony Allen, MD 125 mL/hr at 05/07/15 1201    . acetaminophen (TYLENOL) tablet 650 mg  650 mg Oral Q6H PRN Nayana Abrol, MD   650 mg at 05/07/15 1530   Or  . acetaminophen (TYLENOL) suppository  650 mg  650 mg Rectal Q6H PRN Nayana Abrol, MD      . cefTRIAXone (ROCEPHIN) 1 g in dextrose 5 % 50 mL IVPB  1 g Intravenous Q24H Anthony Allen, MD 100 mL/hr at 05/07/15 1525 1 g at 05/07/15 1525  . diphenhydrAMINE (BENADRYL) 12.5 MG/5ML elixir 12.5 mg  12.5 mg Oral Daily PRN Orlando Vega, MD   12.5 mg at 05/07/15 0939  . docusate sodium (COLACE) capsule 100 mg  100 mg Oral BID Nayana Abrol, MD   100 mg at 05/07/15 2035  . enoxaparin (LOVENOX) injection 40 mg  40 mg Subcutaneous Q24H Nayana Abrol, MD   40 mg at 05/07/15 2035  . insulin aspart (novoLOG) injection 0-15 Units  0-15 Units Subcutaneous TID WC Orlando Vega, MD   3 Units at 05/08/15 0828  . levalbuterol (XOPENEX) nebulizer solution 0.63 mg  0.63 mg Nebulization Q6H PRN Nayana Abrol, MD      . ondansetron (ZOFRAN) tablet 4 mg  4 mg Oral Q6H PRN Nayana Abrol, MD       Or  . ondansetron (ZOFRAN) injection 4 mg  4 mg Intravenous Q6H PRN Nayana Abrol, MD      . sodium chloride 0.9 % injection 3 mL  3 mL Intravenous Q12H Nayana Abrol, MD   3 mL at 05/07/15 2036  . traMADol (ULTRAM) tablet 100 mg  100 mg Oral Q6H PRN Orlando Vega, MD      . vancomycin (VANCOCIN) IVPB 1000 mg/200 mL premix  1,000 mg Intravenous Q12H Justin M Legge, RPH   1,000 mg at 05/08/15 0216    Allergies as of 05/06/2015 - Review Complete 05/06/2015  Allergen Reaction Noted  . Latex Rash 05/06/2015    Family History  Problem Relation Age of Onset  . Parkinson's disease Father   . Heart attack Maternal Grandmother     Social History   Social History  . Marital Status: Divorced    Spouse Name: N/A  . Number of Children: N/A  . Years of Education: N/A   Occupational History  . Not on file.   Social History Main Topics  . Smoking status: Never Smoker   . Smokeless tobacco: Never Used  . Alcohol Use: No  . Drug Use: No  . Sexual Activity: No   Other Topics Concern  . Not on file   Social History Narrative    Review of Systems: Pertinent positive  and negative review of systems were noted in the above HPI section.  All other review of systems was otherwise negative.  Physical Exam: Vital signs in last 24 hours: Temp:  [97.7 F (36.5 C)] 97.7 F (36.5 C) (01/19 0553) Pulse Rate:  [58-62] 58 (01/19 0553) Resp:  [16-18] 16 (01/19 0553) BP: (106-116)/(66-68) 116/68 mmHg (01/19 0553) SpO2:  [95 %-98 %] 98 % (01/19 0553) Last BM Date: 05/05/15 General:   Alert,  Well-developed, well-nourished, WM pleasant and cooperative in NAD Head:  Normocephalic and   atraumatic. Eyes:  Sclera clear, no icterus.   Conjunctiva pink. Ears:  Normal auditory acuity. Nose:  No deformity, discharge,  or lesions. Mouth:  No deformity or lesions.   Neck:  Supple; no masses or thyromegaly. Lungs:  Scattered rhonchi   Heart:  Regular rate and rhythm; no murmurs, clicks, rubs,  or gallops. Abdomen:  Soft,nontender, BS active,nonpalp mass or hsm.   Rectal:  Deferred  Msk:  Symmetrical without gross deformities. . Pulses:  Normal pulses noted. Extremities:  Without clubbing or edema. Neurologic:  Alert and  oriented x4;  grossly normal neurologically. Skin:  Intact without significant lesions or rashes.. Psych:  Alert and cooperative. Normal mood and affect.  Intake/Output from previous day: 01/18 0701 - 01/19 0700 In: 922 [I.V.:872; IV Piggyback:50] Out: 2000 [Urine:2000] Intake/Output this shift: Total I/O In: 442 [P.O.:442] Out: 1150 [Urine:1150]  Lab Results:  Recent Labs  05/06/15 1220 05/07/15 0540  WBC 7.7 6.2  HGB 16.8 15.4  HCT 49.2 45.6  PLT 155 139*   BMET  Recent Labs  05/06/15 1220 05/07/15 0540  NA 138 137  K 3.9 3.6  CL 102 102  CO2 25 23  GLUCOSE 166* 167*  BUN 17 16  CREATININE 0.96 0.82  CALCIUM 9.2 8.6*   LFT  Recent Labs  05/07/15 0540  PROT 6.5  ALBUMIN 3.4*  AST 18  ALT 17  ALKPHOS 69  BILITOT 1.2    IMPRESSION:  54 yo male with one year hx of solid food dysphagia,progressive, intermittent  heartburn. Symptoms consistent with chronic GERD and peptic stricture. #2 Bronchitis #3 ?bacteremia #4 AODM #5 hx subarachnoid hemorrhage 2014  PLAN: Have scheduled for EGD with probable esophageal dilation with Dr Lurlie Wigen tomorrow 05/09/2015. Procedure discussed in detail with the patient and he is agreeable to proceed. Start daily PPI Hold Lovenox until post esophageal dilation.  Amy Esterwood  05/08/2015, 12:13 PM     [facility] Attending   I have taken an interval history, reviewed the chart and examined the patient. I agree with the Advanced Practitioner's note, impression and recommendations.    Intermittent and chronic dysphagia w/ hx GERD EGD, likely esophageal dilation tomorrow. The risks and benefits as well as alternatives of endoscopic procedure(s) have been discussed and reviewed. All questions answered. The patient agrees to proceed.  Zissy Hamlett E. Wren Pryce, MD, FACG [facility] [phone] (pager) [phone] after 5 PM, weekends and holidays  05/08/2015 2:35 PM  

## 2015-05-09 NOTE — Anesthesia Preprocedure Evaluation (Addendum)
Anesthesia Evaluation  Patient identified by MRN, date of birth, ID band Patient awake    Reviewed: Allergy & Precautions, NPO status , Patient's Chart, lab work & pertinent test results  Airway Mallampati: II  TM Distance: >3 FB Neck ROM: Full    Dental no notable dental hx.    Pulmonary shortness of breath, asthma ,    Pulmonary exam normal breath sounds clear to auscultation       Cardiovascular + Peripheral Vascular Disease  Normal cardiovascular exam Rhythm:Regular Rate:Normal     Neuro/Psych  Headaches, negative psych ROS   GI/Hepatic Neg liver ROS, GERD  ,  Endo/Other  diabetes, Type 2, Oral Hypoglycemic Agents  Renal/GU negative Renal ROS  negative genitourinary   Musculoskeletal negative musculoskeletal ROS (+)   Abdominal   Peds negative pediatric ROS (+)  Hematology negative hematology ROS (+)   Anesthesia Other Findings   Reproductive/Obstetrics negative OB ROS                            Anesthesia Physical Anesthesia Plan  ASA: II  Anesthesia Plan: MAC   Post-op Pain Management:    Induction: Intravenous  Airway Management Planned: Natural Airway  Additional Equipment:   Intra-op Plan:   Post-operative Plan:   Informed Consent: I have reviewed the patients History and Physical, chart, labs and discussed the procedure including the risks, benefits and alternatives for the proposed anesthesia with the patient or authorized representative who has indicated his/her understanding and acceptance.   Dental advisory given  Plan Discussed with: CRNA  Anesthesia Plan Comments:         Anesthesia Quick Evaluation

## 2015-05-09 NOTE — Interval H&P Note (Signed)
History and Physical Interval Note:  05/09/2015 11:56 AM  [name]  has presented today for surgery, with the diagnosis of dysphagia  The various methods of treatment have been discussed with the patient and family. After consideration of risks, benefits and other options for treatment, the patient has consented to  Procedure(s) with comments: ESOPHAGOGASTRODUODENOSCOPY (EGD) WITH PROPOFOL (N/A) - with esophageal dilation as a surgical intervention .  The patient's history has been reviewed, patient examined, no change in status, stable for surgery.  I have reviewed the patient's chart and labs.  Questions were answered to the patient's satisfaction.     Stan Head

## 2015-05-09 NOTE — Op Note (Addendum)
[facility] [address] [place], [address]   ENDOSCOPY PROCEDURE REPORT  PATIENT: [name], [name]  MR#: [id number] BIRTHDATE: [birth year] , 54  yrs. old GENDER: male ENDOSCOPIST: Iva Boop, MD, [facility] PROCEDURE DATE:  05/09/2015 PROCEDURE:  EGD, diagnostic and Maloney dilation of esophagus ASA CLASS:     Class II INDICATIONS:  dysphagia. MEDICATIONS: Per Anesthesia and Monitored anesthesia care TOPICAL ANESTHETIC: Cetacaine Spray  DESCRIPTION OF PROCEDURE: After the risks benefits and alternatives of the procedure were thoroughly explained, informed consent was obtained.  The Pentax Gastroscope [id number] endoscope was introduced through the mouth and advanced to the second portion of the duodenum , Without limitations.  The instrument was slowly withdrawn as the mucosa was fully examined.    1) Mild edema/erythema at GE junction that may indicate esophagitis 2) Otherwise normal EGD 3) 54 Fr Maloney dilator easily passed with minimal resistance and no heme.  Retroflexed views revealed no abnormalities.     The scope was then withdrawn from the patient and the procedure completed.  COMPLICATIONS: There were no immediate complications.  ENDOSCOPIC IMPRESSION: 1) Mild edema/erythema at GE junction that may indicate esophagitis - suspect GERD by hx 2) Otherwise normal EGD 3) 54 Fr Maloney dilator easily passed with minimal resistance and no heme  RECOMMENDATIONS: 1.  Clear liquids until 2PM, then soft foods rest of day.  Resume prior diet tomorrow. 2.  PPI qam chronically 3.  F/U GI prn     ok for dc today from my perspective. Should not drive - he has been sedated   eSigned:  Iva Boop, MD, [facility] 05/09/2015 12:18 PM Revised: 05/09/2015 12:18 PM   CC: The Patient

## 2015-05-09 NOTE — Transfer of Care (Signed)
Immediate Anesthesia Transfer of Care Note  Patient: [name]  Procedure(s) Performed: Procedure(s) with comments: ESOPHAGOGASTRODUODENOSCOPY (EGD) WITH PROPOFOL (N/A) - with esophageal dilation  Patient Location: PACU  Anesthesia Type:MAC  Level of Consciousness: awake, alert , oriented and patient cooperative  Airway & Oxygen Therapy: Patient Spontanous Breathing and Patient connected to face mask oxygen  Post-op Assessment: Report given to RN, Post -op Vital signs reviewed and stable and Patient moving all extremities X 4  Post vital signs: stable  Last Vitals:  Filed Vitals:   05/09/15 0800 05/09/15 1110  BP: 122/71 138/79  Pulse: 60 64  Temp: 36.7 C 36.6 C  Resp:  16    Complications: No apparent anesthesia complications

## 2015-05-11 LAB — CULTURE, BLOOD (ROUTINE X 2): CULTURE: NO GROWTH

## 2015-05-12 ENCOUNTER — Encounter (HOSPITAL_COMMUNITY): Payer: Self-pay | Admitting: Internal Medicine

## 2015-12-10 DIAGNOSIS — T25629A Corrosion of second degree of unspecified foot, initial encounter: Secondary | ICD-10-CM | POA: Diagnosis not present

## 2015-12-10 DIAGNOSIS — E118 Type 2 diabetes mellitus with unspecified complications: Secondary | ICD-10-CM | POA: Diagnosis not present

## 2015-12-10 DIAGNOSIS — S90424A Blister (nonthermal), right lesser toe(s), initial encounter: Secondary | ICD-10-CM | POA: Diagnosis not present

## 2015-12-10 DIAGNOSIS — Z125 Encounter for screening for malignant neoplasm of prostate: Secondary | ICD-10-CM | POA: Diagnosis not present

## 2015-12-10 DIAGNOSIS — Z0000 Encounter for general adult medical examination without abnormal findings: Secondary | ICD-10-CM | POA: Diagnosis not present

## 2015-12-10 DIAGNOSIS — T25221A Burn of second degree of right foot, initial encounter: Secondary | ICD-10-CM | POA: Diagnosis not present

## 2015-12-10 DIAGNOSIS — Z23 Encounter for immunization: Secondary | ICD-10-CM | POA: Diagnosis not present

## 2015-12-10 DIAGNOSIS — E291 Testicular hypofunction: Secondary | ICD-10-CM | POA: Diagnosis not present

## 2015-12-10 DIAGNOSIS — E139 Other specified diabetes mellitus without complications: Secondary | ICD-10-CM | POA: Diagnosis not present

## 2015-12-12 DIAGNOSIS — H524 Presbyopia: Secondary | ICD-10-CM | POA: Diagnosis not present

## 2015-12-12 DIAGNOSIS — H5203 Hypermetropia, bilateral: Secondary | ICD-10-CM | POA: Diagnosis not present

## 2015-12-12 DIAGNOSIS — H5212 Myopia, left eye: Secondary | ICD-10-CM | POA: Diagnosis not present

## 2015-12-18 DIAGNOSIS — N400 Enlarged prostate without lower urinary tract symptoms: Secondary | ICD-10-CM | POA: Diagnosis not present

## 2015-12-18 DIAGNOSIS — E291 Testicular hypofunction: Secondary | ICD-10-CM | POA: Diagnosis not present

## 2015-12-18 DIAGNOSIS — E118 Type 2 diabetes mellitus with unspecified complications: Secondary | ICD-10-CM | POA: Diagnosis not present

## 2016-01-01 DIAGNOSIS — E118 Type 2 diabetes mellitus with unspecified complications: Secondary | ICD-10-CM | POA: Diagnosis not present

## 2016-01-08 DIAGNOSIS — E118 Type 2 diabetes mellitus with unspecified complications: Secondary | ICD-10-CM | POA: Diagnosis not present

## 2016-01-08 DIAGNOSIS — E291 Testicular hypofunction: Secondary | ICD-10-CM | POA: Diagnosis not present

## 2016-02-20 DIAGNOSIS — R972 Elevated prostate specific antigen [PSA]: Secondary | ICD-10-CM | POA: Diagnosis not present

## 2016-02-20 DIAGNOSIS — N5201 Erectile dysfunction due to arterial insufficiency: Secondary | ICD-10-CM | POA: Diagnosis not present

## 2016-03-02 DIAGNOSIS — E118 Type 2 diabetes mellitus with unspecified complications: Secondary | ICD-10-CM | POA: Diagnosis not present

## 2016-03-02 DIAGNOSIS — E139 Other specified diabetes mellitus without complications: Secondary | ICD-10-CM | POA: Diagnosis not present

## 2016-03-08 DIAGNOSIS — E139 Other specified diabetes mellitus without complications: Secondary | ICD-10-CM | POA: Diagnosis not present

## 2016-03-08 DIAGNOSIS — E118 Type 2 diabetes mellitus with unspecified complications: Secondary | ICD-10-CM | POA: Diagnosis not present

## 2016-03-09 DIAGNOSIS — M1990 Unspecified osteoarthritis, unspecified site: Secondary | ICD-10-CM | POA: Diagnosis not present

## 2016-03-09 DIAGNOSIS — E291 Testicular hypofunction: Secondary | ICD-10-CM | POA: Diagnosis not present

## 2016-03-09 DIAGNOSIS — R899 Unspecified abnormal finding in specimens from other organs, systems and tissues: Secondary | ICD-10-CM | POA: Diagnosis not present

## 2016-03-22 DIAGNOSIS — L820 Inflamed seborrheic keratosis: Secondary | ICD-10-CM | POA: Diagnosis not present

## 2016-03-22 DIAGNOSIS — Z1283 Encounter for screening for malignant neoplasm of skin: Secondary | ICD-10-CM | POA: Diagnosis not present

## 2016-03-25 DIAGNOSIS — M19011 Primary osteoarthritis, right shoulder: Secondary | ICD-10-CM | POA: Diagnosis not present

## 2016-03-30 DIAGNOSIS — M7541 Impingement syndrome of right shoulder: Secondary | ICD-10-CM | POA: Diagnosis not present

## 2016-05-07 ENCOUNTER — Encounter: Payer: Self-pay | Admitting: Neurology

## 2016-08-20 DIAGNOSIS — E118 Type 2 diabetes mellitus with unspecified complications: Secondary | ICD-10-CM | POA: Diagnosis not present

## 2016-08-31 DIAGNOSIS — E118 Type 2 diabetes mellitus with unspecified complications: Secondary | ICD-10-CM | POA: Diagnosis not present

## 2016-08-31 DIAGNOSIS — E291 Testicular hypofunction: Secondary | ICD-10-CM | POA: Diagnosis not present

## 2016-11-29 ENCOUNTER — Ambulatory Visit (INDEPENDENT_AMBULATORY_CARE_PROVIDER_SITE_OTHER): Payer: BLUE CROSS/BLUE SHIELD | Admitting: Sports Medicine

## 2016-11-29 ENCOUNTER — Ambulatory Visit (INDEPENDENT_AMBULATORY_CARE_PROVIDER_SITE_OTHER): Payer: BLUE CROSS/BLUE SHIELD

## 2016-11-29 ENCOUNTER — Encounter: Payer: Self-pay | Admitting: Sports Medicine

## 2016-11-29 VITALS — BP 102/72 | HR 83 | Ht 76.0 in | Wt 211.6 lb

## 2016-11-29 DIAGNOSIS — M545 Low back pain: Secondary | ICD-10-CM | POA: Diagnosis not present

## 2016-11-29 DIAGNOSIS — M5136 Other intervertebral disc degeneration, lumbar region: Secondary | ICD-10-CM | POA: Diagnosis not present

## 2016-11-29 DIAGNOSIS — S3992XA Unspecified injury of lower back, initial encounter: Secondary | ICD-10-CM | POA: Diagnosis not present

## 2016-11-29 DIAGNOSIS — W108XXA Fall (on) (from) other stairs and steps, initial encounter: Secondary | ICD-10-CM

## 2016-11-29 DIAGNOSIS — M546 Pain in thoracic spine: Secondary | ICD-10-CM

## 2016-11-29 DIAGNOSIS — M47816 Spondylosis without myelopathy or radiculopathy, lumbar region: Secondary | ICD-10-CM

## 2016-11-29 DIAGNOSIS — S299XXA Unspecified injury of thorax, initial encounter: Secondary | ICD-10-CM | POA: Diagnosis not present

## 2016-11-29 MED ORDER — IBUPROFEN 600 MG PO TABS: 600.0000 mg | ORAL_TABLET | Freq: Three times a day (TID) | ORAL | 0 refills | Status: DC | PRN

## 2016-11-29 MED ORDER — CYCLOBENZAPRINE HCL 5 MG PO TABS: 5.0000 mg | ORAL_TABLET | Freq: Three times a day (TID) | ORAL | 0 refills | Status: DC | PRN

## 2016-11-29 MED ORDER — TRAMADOL HCL 50 MG PO TABS: 50.0000 mg | ORAL_TABLET | Freq: Four times a day (QID) | ORAL | 0 refills | Status: DC | PRN

## 2016-11-29 NOTE — Progress Notes (Signed)
OFFICE VISIT NOTE [name]. [name] [phone]  QUENTAVIOUS Price - 56 y.o. male MRN [id number]  Date of birth: 1960/05/12  Visit Date: 11/29/2016  PCP: Darci Needle, MD   Referred by: Darci Needle, MD  [name], CMA acting as scribe for Dr. Berline Chough.  SUBJECTIVE:   Chief Complaint  Patient presents with  . New Patient (Initial Visit)    back pain   HPI: As below and per problem based documentation when appropriate.  [name] is a new patient presenting today with complaint of back pain.  Pain started last night when he fell on his back stairs. He grabbed the handrail to keep from falling on his knees but he ended up hitting his back and left lower leg. The back pain is mostly in the lower and mid back. He has no visible bruising on his back.   The pain is described as sharp pain with tenderness to palpation. Pain is rated as 8/10.  Worsened with movement, bending. He does not feel pain with breathing Improves with heat  Therapies tried include : heating pad and icing, heat works better ice.   Other associated symptoms include: He has some stiffness in his neck. He denies rib pain.    Review of Systems  Constitutional: Negative for chills and fever.  Respiratory: Negative for shortness of breath and wheezing.   Cardiovascular: Negative for chest pain, palpitations and leg swelling.  Musculoskeletal: Positive for back pain and falls.  Neurological: Negative for dizziness, tingling and headaches.  Endo/Heme/Allergies: Does not bruise/bleed easily.    Otherwise per HPI.  HISTORY & PERTINENT PRIOR DATA:  No specialty comments available. He reports that he has never smoked. He has never used smokeless tobacco. No results for input(s): HGBA1C, LABURIC in the last 8760 hours. Medications & Allergies reviewed per EMR Patient Active Problem List   Diagnosis Date Noted  . Lumbar spondylosis 12/02/2016   . Dysphagia   . Diabetes (HCC) 05/07/2015  . Bacteremia 05/07/2015  . Acute bronchitis 05/06/2015  . Headache   . SOB (shortness of breath)   . Varicose veins of leg with complications 03/18/2015  . Varicose veins of lower extremities with complications 09/09/2014  . Brain contusion (HCC) 09/21/2012  . Subarachnoid hemorrhage (HCC) 09/21/2012  . Type II or unspecified type diabetes mellitus without mention of complication, uncontrolled 09/21/2012  . Concussion with loss of consciousness 09/21/2012  . RHINOSINUSITIS, ACUTE 12/15/2007  . OCCUPATIONAL ASTHMA 07/16/2007  . SWELLING, MASS, OR LUMP IN CHEST 07/16/2007  . DYSPNEA ON EXERTION 05/18/2007  . HYPOGONADISM 05/10/2007  . ASTHMA 05/10/2007  . ESOPHAGEAL REFLUX 05/10/2007  . KLINEFELTERS SYNDROME 05/10/2007  . COUGH 05/10/2007  . OTHER NONSPECIFIC FINDING EXAMINATION OF URINE 05/10/2007   Past Medical History:  Diagnosis Date  . Allergy   . Diabetes mellitus without complication (HCC)    Family History  Problem Relation Age of Onset  . Parkinson's disease Father   . Heart attack Maternal Grandmother    Past Surgical History:  Procedure Laterality Date  . ESOPHAGOGASTRODUODENOSCOPY (EGD) WITH PROPOFOL N/A 05/09/2015   Procedure: ESOPHAGOGASTRODUODENOSCOPY (EGD) WITH PROPOFOL;  Surgeon: Iva Boop, MD;  Location: [facility];  Service: Endoscopy;  Laterality: N/A;  with esophageal dilation  . LASER ABLATION Left 02/03/15  . VARICOSE VEIN SURGERY     Social History   Occupational History  . Not on file.   Social History Main Topics  .  Smoking status: Never Smoker  . Smokeless tobacco: Never Used  . Alcohol use No  . Drug use: No  . Sexual activity: No    OBJECTIVE:  VS:  HT:6\' 4"  (193 cm)   WT:211 lb 9.6 oz (96 kg)  BMI:25.8    BP:102/72  HR:83bpm  TEMP: ( )  RESP:95 % EXAM: Findings:  WDWN, NAD, Non-toxic appearing Alert & appropriately interactive Not depressed or anxious appearing No increased  work of breathing. Pupils are equal. EOM intact without nystagmus No clubbing or cyanosis of the extremities appreciated No significant rashes/lesions/ulcerations overlying the examined area. Radial pulses 2+/4.  No significant generalized UE edema. DP & PT pulses 2+/4.  No significant pretibial edema.  No clubbing or cyanosis Sensation intact to light touch in upper and lower extremities.  Back:  Overall well aligned.  No significant bruising but some generalized swelling across the lumbar spine as well as midthoracic.  He is focal midline tenderness across T7 as well as L3.  There is some paraspinal muscle pain along L4.   lower extremity strength is intact upper extremity strength is intact.  He has normal reflexes.  He does have pain with straight leg raise bilaterally.  Good internal/external rotation of bilateral hips.     Dg Thoracic Spine W/swimmers  Result Date: 11/29/2016 CLINICAL DATA:  Thoracic and lumbar spine pain since a slip and fall on steps last night. Initial encounter. EXAM: THORACIC SPINE - 3 VIEWS COMPARISON:  PA and lateral chest 05/06/2015. FINDINGS: There is no fracture or malalignment. Mild multilevel anterior endplate spurring is seen. Mild convex right curvature with the apex at T9 is unchanged. Paraspinous structures are unremarkable. IMPRESSION: No acute abnormality. Mild convex right scoliosis and degenerative change. Electronically Signed   By: Drusilla Kanner M.D.   On: 11/29/2016 15:44   Dg Lumbar Spine Complete  Result Date: 11/29/2016 CLINICAL DATA:  Thoracic and lumbar spine pain since a slip and fall on steps last night. Initial encounter. EXAM: LUMBAR SPINE - COMPLETE 4+ VIEW COMPARISON:  CT chest, abdomen and pelvis 08/14/2006. FINDINGS: There is no evidence of lumbar spine fracture. Alignment is normal. Intervertebral disc spaces are maintained. Facet degenerative disease lower lumbar spine noted. IMPRESSION: No acute abnormality. Electronically Signed    By: Drusilla Kanner M.D.   On: 11/29/2016 15:45    ASSESSMENT & PLAN:     ICD-10-CM   1. Acute bilateral thoracic back pain M54.6 DG Thoracic Spine W/Swimmers    DG Lumbar Spine Complete    ibuprofen (ADVIL,MOTRIN) 600 MG tablet    CT THORACIC SPINE WO CONTRAST    cyclobenzaprine (FLEXERIL) 5 MG tablet  2. Fall down stairs, initial encounter W10.8XXA   3. Other intervertebral disc degeneration, lumbar region M51.36 CT Lumbar Spine Wo Contrast  4. Acute midline low back pain, with sciatica presence unspecified M54.5   5. Lumbar spondylosis M47.816   ================================================================= Lumbar spondylosis Patient has underlying degenerative changes of his thoracic and lumbar spine.  Given the acuity of the fall the nature of him swinging around landing on a flight of steps with the focal pain directly over the midline further evaluation with CT scan of the thoracic and lumbar spine is indicated to rule out occult fracture.  He also has some congenital anomalies are difficult to differentiate and further characterization is needed.  The L4 transverse process also has a slight irregularity in it which could be concerning for a nondisplaced TP fracture.  We will plan to follow-up with  him by telephone and provided pain medication and anti-inflammatories as outlined.  Work excuse to remain out of heavy lifting and twisting at this time. =================================================================  Follow-up: No Follow-up on file.   CMA/ATC served as Neurosurgeonscribe during this visit. History, Physical, and Plan performed by medical provider. Documentation and orders reviewed and attested to.      Gaspar BiddingMichael Kaydon Creedon, DO    [facility] Phys[facility]

## 2016-11-30 ENCOUNTER — Ambulatory Visit
Admission: RE | Admit: 2016-11-30 | Discharge: 2016-11-30 | Disposition: A | Discharge status: Home / Self Care | Payer: BLUE CROSS/BLUE SHIELD | Source: Ambulatory Visit | Attending: Sports Medicine | Admitting: Sports Medicine

## 2016-11-30 DIAGNOSIS — M546 Pain in thoracic spine: Secondary | ICD-10-CM | POA: Diagnosis not present

## 2016-11-30 DIAGNOSIS — M545 Low back pain: Secondary | ICD-10-CM | POA: Diagnosis not present

## 2016-11-30 DIAGNOSIS — S299XXA Unspecified injury of thorax, initial encounter: Secondary | ICD-10-CM | POA: Diagnosis not present

## 2016-11-30 DIAGNOSIS — M5136 Other intervertebral disc degeneration, lumbar region: Secondary | ICD-10-CM

## 2016-12-01 ENCOUNTER — Telehealth: Payer: Self-pay | Admitting: Sports Medicine

## 2016-12-01 ENCOUNTER — Other Ambulatory Visit: Payer: Self-pay

## 2016-12-01 MED ORDER — PREDNISONE 50 MG PO TABS: 50.0000 mg | ORAL_TABLET | Freq: Every day | ORAL | 0 refills | Status: AC

## 2016-12-01 MED ORDER — TRAMADOL HCL 50 MG PO TABS: 50.0000 mg | ORAL_TABLET | Freq: Three times a day (TID) | ORAL | 0 refills | Status: DC | PRN

## 2016-12-01 MED ORDER — PREDNISONE 50 MG PO TABS: 50.0000 mg | ORAL_TABLET | Freq: Every day | ORAL | 0 refills | Status: DC

## 2016-12-01 NOTE — Telephone Encounter (Signed)
Patient seen briefly during his wife's office visit.  He is continuing to have significant pain that is keeping him up at night.  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  Follow-up as previously scheduled.

## 2016-12-01 NOTE — Telephone Encounter (Signed)
[facility] called. She states that she was given a verbal for tramadol, and the patient told her that they were supposed to get the prednisone too. Per the script, both [facility] the patient advised [facility] that this is incorrect.   [facility] is [facility] sent to [facility] @ [facility].

## 2016-12-01 NOTE — Telephone Encounter (Signed)
[facility]

## 2016-12-02 DIAGNOSIS — M47816 Spondylosis without myelopathy or radiculopathy, lumbar region: Secondary | ICD-10-CM | POA: Insufficient documentation

## 2016-12-02 NOTE — Assessment & Plan Note (Signed)
Patient has underlying degenerative changes of his thoracic and lumbar spine.  Given the acuity of the fall the nature of him swinging around landing on a flight of steps with the focal pain directly over the midline further evaluation with CT scan of the thoracic and lumbar spine is indicated to rule out occult fracture.  He also has some congenital anomalies are difficult to differentiate and further characterization is needed.  The L4 transverse process also has a slight irregularity in it which could be concerning for a nondisplaced TP fracture.  We will plan to follow-up with him by telephone and provided pain medication and anti-inflammatories as outlined.  Work excuse to remain out of heavy lifting and twisting at this time.

## 2016-12-14 ENCOUNTER — Telehealth: Payer: Self-pay | Admitting: Sports Medicine

## 2016-12-14 NOTE — Telephone Encounter (Signed)
Patient needs a call to go over what his appointment will consist of since he got the CT scan from [facility]. Patient is anxious on what will be done at the office tomorrow. Call patient today to advise.

## 2016-12-14 NOTE — Telephone Encounter (Signed)
Spoke with patient who advised that he got 2 calls from the office yesterday but he was in [place] with his wife. I advised that it was likely an appointment reminder call. Pt verbalized understanding and will be here tomorrow at 3:15 for his OV with Dr. Berline Chough.

## 2016-12-15 ENCOUNTER — Ambulatory Visit: Payer: BLUE CROSS/BLUE SHIELD | Admitting: Sports Medicine

## 2016-12-15 NOTE — Progress Notes (Deleted)
OFFICE VISIT NOTE [name]. [name] Sports Medicine [facility] [phone]  [name] - 56 y.o. male MRN [id number]  Date of birth: [birth year]  Visit Date: 12/15/2016  PCP: Darci Needle, MD   Referred by: Darci Needle, MD  {Scribe Credentials} acting as scribe for Dr. Berline Chough.  SUBJECTIVE:  No chief complaint on file.  HPI: As below and per problem based documentation when appropriate.  HPI  ROS  Otherwise per HPI.  HISTORY & PERTINENT PRIOR DATA:  No specialty comments available. He reports that he has never smoked. He has never used smokeless tobacco. No results for input(s): HGBA1C, LABURIC in the last 8760 hours. Medications & Allergies reviewed per EMR Patient Active Problem List   Diagnosis Date Noted  . Lumbar spondylosis 12/02/2016  . Dysphagia   . Diabetes (HCC) 05/07/2015  . Bacteremia 05/07/2015  . Acute bronchitis 05/06/2015  . Headache   . SOB (shortness of breath)   . Varicose veins of leg with complications 03/18/2015  . Varicose veins of lower extremities with complications 09/09/2014  . Brain contusion (HCC) 09/21/2012  . Subarachnoid hemorrhage (HCC) 09/21/2012  . Type II or unspecified type diabetes mellitus without mention of complication, uncontrolled 09/21/2012  . Concussion with loss of consciousness 09/21/2012  . RHINOSINUSITIS, ACUTE 12/15/2007  . OCCUPATIONAL ASTHMA 07/16/2007  . SWELLING, MASS, OR LUMP IN CHEST 07/16/2007  . DYSPNEA ON EXERTION 05/18/2007  . HYPOGONADISM 05/10/2007  . ASTHMA 05/10/2007  . ESOPHAGEAL REFLUX 05/10/2007  . KLINEFELTERS SYNDROME 05/10/2007  . COUGH 05/10/2007  . OTHER NONSPECIFIC FINDING EXAMINATION OF URINE 05/10/2007   Past Medical History:  Diagnosis Date  . Allergy   . Diabetes mellitus without complication (HCC)    Family History  Problem Relation Age of Onset  . Parkinson's disease Father   . Heart attack Maternal Grandmother    Past Surgical  History:  Procedure Laterality Date  . ESOPHAGOGASTRODUODENOSCOPY (EGD) WITH PROPOFOL N/A 05/09/2015   Procedure: ESOPHAGOGASTRODUODENOSCOPY (EGD) WITH PROPOFOL;  Surgeon: Iva Boop, MD;  Location: [facility];  Service: Endoscopy;  Laterality: N/A;  with esophageal dilation  . LASER ABLATION Left 02/03/15  . VARICOSE VEIN SURGERY     Social History   Occupational History  . Not on file.   Social History Main Topics  . Smoking status: Never Smoker  . Smokeless tobacco: Never Used  . Alcohol use No  . Drug use: No  . Sexual activity: No    OBJECTIVE:  VS:  HT:    WT:   BMI:     BP:   HR: bpm  TEMP: ( )  RESP:  EXAM: No additional findings.    Dg Thoracic Spine W/swimmers  Result Date: 11/29/2016 CLINICAL DATA:  Thoracic and lumbar spine pain since a slip and fall on steps last night. Initial encounter. EXAM: THORACIC SPINE - 3 VIEWS COMPARISON:  PA and lateral chest 05/06/2015. FINDINGS: There is no fracture or malalignment. Mild multilevel anterior endplate spurring is seen. Mild convex right curvature with the apex at T9 is unchanged. Paraspinous structures are unremarkable. IMPRESSION: No acute abnormality. Mild convex right scoliosis and degenerative change. Electronically Signed   By: Drusilla Kanner M.D.   On: 11/29/2016 15:44   Dg Lumbar Spine Complete  Result Date: 11/29/2016 CLINICAL DATA:  Thoracic and lumbar spine pain since a slip and fall on steps last night. Initial encounter. EXAM: LUMBAR SPINE - COMPLETE 4+ VIEW COMPARISON:  CT chest, abdomen  and pelvis 08/14/2006. FINDINGS: There is no evidence of lumbar spine fracture. Alignment is normal. Intervertebral disc spaces are maintained. Facet degenerative disease lower lumbar spine noted. IMPRESSION: No acute abnormality. Electronically Signed   By: Drusilla Kanner M.D.   On: 11/29/2016 15:45   Ct Thoracic Spine Wo Contrast  Result Date: 11/30/2016 CLINICAL DATA:  Thoracic spine pain for 2 days. Status post  fall on stairs on 11/28/2016. Subsequent encounter. EXAM: CT THORACIC SPINE WITHOUT CONTRAST TECHNIQUE: Multidetector CT images of the thoracic were obtained using the standard protocol without intravenous contrast. COMPARISON:  Plain films thoracic spine 11/29/2016. CT chest, abdomen and pelvis 08/14/2006. FINDINGS: Alignment: No listhesis. Mild convex right scoliosis with the apex at T8-9 noted. Vertebrae: No fracture or worrisome lesion. Scattered mild anterior endplate spurring is noted. There is autologous fusion across the T9-10 disc interspace as seen on the 2008 comparison examination. Paraspinal and other soft tissues: Negative. Disc levels: The central canal and foramina appear open at all levels. Minimal disc bulge T9-10 is noted. There is some facet arthropathy in the lower thoracic spine. IMPRESSION: Negative for fracture or other acute abnormality. Mild appearing degenerative change without central canal stenosis. Autologous fusion across the T9-10 level is chronic. Electronically Signed   By: Drusilla Kanner M.D.   On: 11/30/2016 15:26   Ct Lumbar Spine Wo Contrast  Result Date: 11/30/2016 CLINICAL DATA:  Low back pain since a fall on stairs 11/28/2016. Subsequent encounter. EXAM: CT LUMBAR SPINE WITHOUT CONTRAST TECHNIQUE: Multidetector CT imaging of the lumbar spine was performed without intravenous contrast administration. Multiplanar CT image reconstructions were also generated. COMPARISON:  Plain films lumbar spine 11/29/2016. FINDINGS: Segmentation: Standard. Alignment: Maintained. Vertebrae: No fracture or worrisome lesion. Paraspinal and other soft tissues: Negative. Disc levels: L1-2: Mild facet degenerative change. Otherwise negative. L2-3:  Mild facet degenerative change.  Otherwise negative. L3-4: Shallow disc bulge and mild facet degenerative disease. The central canal and foramina remain open. L4-5: Shallow broad-based disc bulge with endplate spurring. Mild facet degenerative  change. The central canal and foramina are open. L5-S1: Shallow disc bulge and endplate spur. There is some facet degenerative disease which is more notable on the left. The central canal and foramina are open. IMPRESSION: Negative for fracture or other acute abnormality. Mild lumbar spondylosis without stenosis. Electronically Signed   By: Drusilla Kanner M.D.   On: 11/30/2016 15:30   ASSESSMENT & PLAN:  { }

## 2016-12-16 ENCOUNTER — Telehealth: Payer: Self-pay | Admitting: Sports Medicine

## 2016-12-16 ENCOUNTER — Other Ambulatory Visit: Payer: Self-pay

## 2016-12-16 ENCOUNTER — Ambulatory Visit (INDEPENDENT_AMBULATORY_CARE_PROVIDER_SITE_OTHER): Payer: BLUE CROSS/BLUE SHIELD | Admitting: Sports Medicine

## 2016-12-16 ENCOUNTER — Ambulatory Visit (INDEPENDENT_AMBULATORY_CARE_PROVIDER_SITE_OTHER): Payer: BLUE CROSS/BLUE SHIELD

## 2016-12-16 ENCOUNTER — Encounter: Payer: Self-pay | Admitting: Sports Medicine

## 2016-12-16 VITALS — BP 98/72 | HR 90 | Ht 76.0 in | Wt 211.0 lb

## 2016-12-16 DIAGNOSIS — E1159 Type 2 diabetes mellitus with other circulatory complications: Secondary | ICD-10-CM | POA: Diagnosis not present

## 2016-12-16 DIAGNOSIS — M79672 Pain in left foot: Secondary | ICD-10-CM

## 2016-12-16 DIAGNOSIS — S79912A Unspecified injury of left hip, initial encounter: Secondary | ICD-10-CM | POA: Diagnosis not present

## 2016-12-16 DIAGNOSIS — M25552 Pain in left hip: Secondary | ICD-10-CM | POA: Diagnosis not present

## 2016-12-16 DIAGNOSIS — M47816 Spondylosis without myelopathy or radiculopathy, lumbar region: Secondary | ICD-10-CM | POA: Diagnosis not present

## 2016-12-16 MED ORDER — NAPROXEN-ESOMEPRAZOLE 500-20 MG PO TBEC: 1.0000 | DELAYED_RELEASE_TABLET | Freq: Two times a day (BID) | ORAL | 2 refills | Status: DC

## 2016-12-16 MED ORDER — METHOCARBAMOL 500 MG PO TABS: 500.0000 mg | ORAL_TABLET | Freq: Three times a day (TID) | ORAL | 1 refills | Status: DC | PRN

## 2016-12-16 MED ORDER — NAPROXEN-ESOMEPRAZOLE 500-20 MG PO TBEC: 1.0000 | DELAYED_RELEASE_TABLET | Freq: Two times a day (BID) | ORAL | 0 refills | Status: AC

## 2016-12-16 MED ORDER — NAPROXEN-ESOMEPRAZOLE 500-20 MG PO TBEC
1.0000 | DELAYED_RELEASE_TABLET | Freq: Two times a day (BID) | ORAL | 2 refills | Status: DC
Start: 2016-12-16 — End: 2016-12-16

## 2016-12-16 NOTE — Telephone Encounter (Signed)
Spoke with [place] regarding patient's request to have his refills re-sent to [facility].  I have re-sent his medications to [facility].

## 2016-12-16 NOTE — Progress Notes (Signed)
OFFICE VISIT NOTE [name]. [name] Sports Medicine [facility] [phone]  XZAVIEN HARADA - 56 y.o. male MRN [id number]  Date of birth: [birth year]  Visit Date: 12/16/2016  PCP: Darci Needle, MD   Referred by: Darci Needle, MD  [name], CMA acting as scribe for Dr. Berline Chough.  SUBJECTIVE:   Chief Complaint  Patient presents with  . Follow-up    thoracic back pain   HPI: As below and per problem based documentation when appropriate.  Mr. Foxworth is an established patient presenting today in follow-up of thoracic back pain.   Pt has been taking Prednisone, Tramadol, and Cyclobenzaprine with little relief of pain. He says that Tramadol makes him sick to his stomach but he still takes it at night for the pain. He says that the Prednisone helped a little while he was taking it but pain was back to normal now. Pt reports that pain was about 10/10 initially but now is about 7/10. He has trouble sleeping because of his back and only gets about 3 hours of sleep. Pt was released to work with light duty.   Pt reports heel pain x 3 weeks. Pain has gotten worse over time. Pain is worse when bearing weight. He hasn't noticed any swelling in the heel or ankle.     Review of Systems  Constitutional: Negative for chills and fever.  Respiratory: Negative for shortness of breath and wheezing.   Cardiovascular: Negative for chest pain and palpitations.  Musculoskeletal: Positive for back pain and falls.  Neurological: Negative for dizziness, tingling and headaches.  Endo/Heme/Allergies: Does not bruise/bleed easily.    Otherwise per HPI.  HISTORY & PERTINENT PRIOR DATA:  No specialty comments available. He reports that he has never smoked. He has never used smokeless tobacco. No results for input(s): HGBA1C, LABURIC in the last 8760 hours. Medications & Allergies reviewed per EMR Patient Active Problem List   Diagnosis Date Noted  . Pain of left  heel 12/21/2016  . Lumbar spondylosis 12/02/2016  . Dysphagia   . Diabetes (HCC) 05/07/2015  . Bacteremia 05/07/2015  . Acute bronchitis 05/06/2015  . Headache   . SOB (shortness of breath)   . Varicose veins of leg with complications 03/18/2015  . Varicose veins of lower extremities with complications 09/09/2014  . Brain contusion (HCC) 09/21/2012  . Subarachnoid hemorrhage (HCC) 09/21/2012  . Concussion with loss of consciousness 09/21/2012  . RHINOSINUSITIS, ACUTE 12/15/2007  . OCCUPATIONAL ASTHMA 07/16/2007  . SWELLING, MASS, OR LUMP IN CHEST 07/16/2007  . DYSPNEA ON EXERTION 05/18/2007  . HYPOGONADISM 05/10/2007  . ASTHMA 05/10/2007  . ESOPHAGEAL REFLUX 05/10/2007  . KLINEFELTERS SYNDROME 05/10/2007  . COUGH 05/10/2007  . OTHER NONSPECIFIC FINDING EXAMINATION OF URINE 05/10/2007   Past Medical History:  Diagnosis Date  . Allergy   . Diabetes mellitus without complication (HCC)    Family History  Problem Relation Age of Onset  . Parkinson's disease Father   . Heart attack Maternal Grandmother    Past Surgical History:  Procedure Laterality Date  . ESOPHAGOGASTRODUODENOSCOPY (EGD) WITH PROPOFOL N/A 05/09/2015   Procedure: ESOPHAGOGASTRODUODENOSCOPY (EGD) WITH PROPOFOL;  Surgeon: Iva Boop, MD;  Location: [facility];  Service: Endoscopy;  Laterality: N/A;  with esophageal dilation  . LASER ABLATION Left 02/03/15  . VARICOSE VEIN SURGERY     Social History   Occupational History  . Not on file.   Social History Main Topics  . Smoking status: Never  Smoker  . Smokeless tobacco: Never Used  . Alcohol use No  . Drug use: No  . Sexual activity: No    OBJECTIVE:  VS:  HT:6\' 4"  (193 cm)   WT:211 lb (95.7 kg)  BMI:25.69    BP:98/72  HR:90bpm  TEMP: ( )  RESP:95 % EXAM: Findings:  Adult male, alert and appropriate with slightly flat affect.  He is overall pleasant and in no acute respiratory distress.  He has generalized TTP across the mid aspect of his  back thoracic spine as well as lumbar spine.  He has some pain with straight leg raise bilaterally with markedly tight hamstrings but no true radicular symptoms.  He does have pain with thoracic extension localizing to the left midthoracic region.  His left heel is overall well aligned.  He does have small amount of swelling over the posterior aspect consistent with a soft tissue contusion.  He has an intact Achilles with a normal dorsiflexion motion with Thompson test.  DP and PT pulses are 2+/4.  He has only mild pain with palpation of the origin of the plantar fascia but does have pain with calcaneal squeeze test.     Dg Thoracic Spine W/swimmers  Result Date: 11/29/2016 CLINICAL DATA:  Thoracic and lumbar spine pain since a slip and fall on steps last night. Initial encounter. EXAM: THORACIC SPINE - 3 VIEWS COMPARISON:  PA and lateral chest 05/06/2015. FINDINGS: There is no fracture or malalignment. Mild multilevel anterior endplate spurring is seen. Mild convex right curvature with the apex at T9 is unchanged. Paraspinous structures are unremarkable. IMPRESSION: No acute abnormality. Mild convex right scoliosis and degenerative change. Electronically Signed   By: Drusilla Kanner M.D.   On: 11/29/2016 15:44   Dg Lumbar Spine Complete  Result Date: 11/29/2016 CLINICAL DATA:  Thoracic and lumbar spine pain since a slip and fall on steps last night. Initial encounter. EXAM: LUMBAR SPINE - COMPLETE 4+ VIEW COMPARISON:  CT chest, abdomen and pelvis 08/14/2006. FINDINGS: There is no evidence of lumbar spine fracture. Alignment is normal. Intervertebral disc spaces are maintained. Facet degenerative disease lower lumbar spine noted. IMPRESSION: No acute abnormality. Electronically Signed   By: Drusilla Kanner M.D.   On: 11/29/2016 15:45   Dg Os Calcis Left  Result Date: 12/16/2016 CLINICAL DATA:  Left heel pain since slipping and falling down steps 3 weeks ago. No additional injuries. EXAM: LEFT OS  CALCIS - 2+ VIEW COMPARISON:  Left ankle series of July 11, 2005 FINDINGS: The calcaneus is adequately mineralized. There is no acute or healing fracture. There are plantar and Achilles region calcaneal spurs. The talus appears intact. The soft tissues of the heel pad are unremarkable. IMPRESSION: No acute fracture of the calcaneus is observed. There are plantar and Achilles region spurs. Electronically Signed   By: David  [place] M.D.   On: 12/16/2016 14:50   Ct Thoracic Spine Wo Contrast  Result Date: 11/30/2016 CLINICAL DATA:  Thoracic spine pain for 2 days. Status post fall on stairs on 11/28/2016. Subsequent encounter. EXAM: CT THORACIC SPINE WITHOUT CONTRAST TECHNIQUE: Multidetector CT images of the thoracic were obtained using the standard protocol without intravenous contrast. COMPARISON:  Plain films thoracic spine 11/29/2016. CT chest, abdomen and pelvis 08/14/2006. FINDINGS: Alignment: No listhesis. Mild convex right scoliosis with the apex at T8-9 noted. Vertebrae: No fracture or worrisome lesion. Scattered mild anterior endplate spurring is noted. There is autologous fusion across the T9-10 disc interspace as seen on the 2008 comparison examination.  Paraspinal and other soft tissues: Negative. Disc levels: The central canal and foramina appear open at all levels. Minimal disc bulge T9-10 is noted. There is some facet arthropathy in the lower thoracic spine. IMPRESSION: Negative for fracture or other acute abnormality. Mild appearing degenerative change without central canal stenosis. Autologous fusion across the T9-10 level is chronic. Electronically Signed   By: Drusilla Kannerhomas  Dalessio M.D.   On: 11/30/2016 15:26   Ct Lumbar Spine Wo Contrast  Result Date: 11/30/2016 CLINICAL DATA:  Low back pain since a fall on stairs 11/28/2016. Subsequent encounter. EXAM: CT LUMBAR SPINE WITHOUT CONTRAST TECHNIQUE: Multidetector CT imaging of the lumbar spine was performed without intravenous contrast  administration. Multiplanar CT image reconstructions were also generated. COMPARISON:  Plain films lumbar spine 11/29/2016. FINDINGS: Segmentation: Standard. Alignment: Maintained. Vertebrae: No fracture or worrisome lesion. Paraspinal and other soft tissues: Negative. Disc levels: L1-2: Mild facet degenerative change. Otherwise negative. L2-3:  Mild facet degenerative change.  Otherwise negative. L3-4: Shallow disc bulge and mild facet degenerative disease. The central canal and foramina remain open. L4-5: Shallow broad-based disc bulge with endplate spurring. Mild facet degenerative change. The central canal and foramina are open. L5-S1: Shallow disc bulge and endplate spur. There is some facet degenerative disease which is more notable on the left. The central canal and foramina are open. IMPRESSION: Negative for fracture or other acute abnormality. Mild lumbar spondylosis without stenosis. Electronically Signed   By: Drusilla Kannerhomas  Dalessio M.D.   On: 11/30/2016 15:30   ASSESSMENT & PLAN:     ICD-10-CM   1. Lumbar spondylosis M47.816 Ambulatory referral to Physical Therapy    Naproxen-Esomeprazole (VIMOVO) 500-20 MG TBEC  2. Pain of left heel M79.672 DG Os Calcis Left    Naproxen-Esomeprazole (VIMOVO) 500-20 MG TBEC  3. Type 2 diabetes mellitus with other circulatory complication, without long-term current use of insulin (HCC) E11.59   ================================================================= Lumbar spondylosis His underlying lumbar spondylosis as well as thoracic spondylosis that is likely been exacerbated with his most recent fall.  Prescription anti-inflammatories provided as well as referral to physical therapy to work on core conditioning and decreasing acute muscle spasm secondary to most recent trauma.  No evidence of acute fractures on CT scan of the thoracic and lumbar spine.  If any true radicular symptoms could consider further evaluation with MRI lumbar spine this is not indicated this  times does seem to be axial in nature.  Pain of left heel Symptoms are most consistent with a left heel contusion likely secondary to falling on the steps.  He has only focal pain with a small amount of swelling and small amount of discoloration in this area.  No focal abnormality on x-ray and no true mechanism for a stress fracture but if any lack of improvement could consider MRI of the left heel.  Waffle heel cups recommended and information to obtain these was provided.  Diabetes (HCC) He is not a good candidate to continue with corticosteroids as he does have diabetes but this did provide a significant improvement can consider diagnostic and therapeutic epidural steroid injections if any lack of improvement. ================================================================= Patient Instructions  We are referring you to physical therapy for your back. I would like for you to obtain over-the-counter waffle heel cups from your local pharmacy.  We gave you a handout with an example of these.   =================================================================  Follow-up: Return in about 6 weeks (around 01/27/2017) for +++schedule PT visit with Lauren+++.   CMA/ATC served as Neurosurgeonscribe during this visit.  History, Physical, and Plan performed by medical provider. Documentation and orders reviewed and attested to.      Teresa Coombs, [place] [facility] Physician

## 2016-12-16 NOTE — Patient Instructions (Addendum)
We are referring you to physical therapy for your back. I would like for you to obtain over-the-counter waffle heel cups from your local pharmacy.  We gave you a handout with an example of these.

## 2016-12-16 NOTE — Telephone Encounter (Signed)
MEDICATION: Naproxen-Esomeprazole and methocarbamol    PHARMACY: [facility] [place] - [address] [phone] (Phone) [phone] (Fax)   IS THIS A 90 DAY SUPPLY : yes  IS PATIENT OUT OF MEDICATION: yes, has not started  IF NOT; HOW MUCH IS LEFT: n/a  LAST APPOINTMENT DATE:12/16/16  NEXT APPOINTMENT DATE:01/27/17  OTHER COMMENTS: Patient saw Dr. Berline Choughigby this morning and requested rx refills however, he has changed his pharmacy to the one above. Please send the medications to the pharmacy listed. Patient is aware Dr. Berline Choughigby and staff are gone for the day.    **Let patient know to contact pharmacy at the end of the day to make sure medication is ready. **  ** Please notify patient to allow 48-72 hours to process**  **Encourage patient to contact the pharmacy for refills or they can request refills through [facility]**

## 2016-12-16 NOTE — Telephone Encounter (Signed)
Patient called in reference to medications he got prescribed at his appointment on 12/16/16. Patient stated he had questions about the medications. Please call patient and advise.

## 2016-12-17 NOTE — Telephone Encounter (Signed)
Called pt and advised that Vimovo was sent to [facility] and he would receive that rx by mail (ha has been contacted by mail order pharmacy) and he should take as directed. Methocarbamol was sent to his local pharmacy and he should take that q8h prn. Pt verbalized understanding.

## 2016-12-21 DIAGNOSIS — M79672 Pain in left foot: Secondary | ICD-10-CM | POA: Insufficient documentation

## 2016-12-21 NOTE — Assessment & Plan Note (Signed)
His underlying lumbar spondylosis as well as thoracic spondylosis that is likely been exacerbated with his most recent fall.  Prescription anti-inflammatories provided as well as referral to physical therapy to work on core conditioning and decreasing acute muscle spasm secondary to most recent trauma.  No evidence of acute fractures on CT scan of the thoracic and lumbar spine.  If any true radicular symptoms could consider further evaluation with MRI lumbar spine this is not indicated this times does seem to be axial in nature.

## 2016-12-21 NOTE — Assessment & Plan Note (Signed)
He is not a good candidate to continue with corticosteroids as he does have diabetes but this did provide a significant improvement can consider diagnostic and therapeutic epidural steroid injections if any lack of improvement.

## 2016-12-21 NOTE — Assessment & Plan Note (Signed)
Symptoms are most consistent with a left heel contusion likely secondary to falling on the steps.  He has only focal pain with a small amount of swelling and small amount of discoloration in this area.  No focal abnormality on x-ray and no true mechanism for a stress fracture but if any lack of improvement could consider MRI of the left heel.  Waffle heel cups recommended and information to obtain these was provided.

## 2016-12-22 ENCOUNTER — Ambulatory Visit (INDEPENDENT_AMBULATORY_CARE_PROVIDER_SITE_OTHER): Payer: BLUE CROSS/BLUE SHIELD | Admitting: Physical Therapy

## 2016-12-22 ENCOUNTER — Encounter: Payer: Self-pay | Admitting: Physical Therapy

## 2016-12-22 DIAGNOSIS — M546 Pain in thoracic spine: Secondary | ICD-10-CM

## 2016-12-23 ENCOUNTER — Encounter: Payer: Self-pay | Admitting: Physical Therapy

## 2016-12-23 ENCOUNTER — Ambulatory Visit (INDEPENDENT_AMBULATORY_CARE_PROVIDER_SITE_OTHER): Payer: BLUE CROSS/BLUE SHIELD | Admitting: Physical Therapy

## 2016-12-23 DIAGNOSIS — M546 Pain in thoracic spine: Secondary | ICD-10-CM | POA: Diagnosis not present

## 2016-12-23 NOTE — Therapy (Signed)
[facility] [address] [place], [place], [address] Phone: [phone]   Fax:  [phone]  Physical Therapy Treatment  Patient Details  Name: [name] MRN: [id number] Date of Birth: 1960-12-19 Referring Provider: Gaspar Bidding  Encounter Date: 12/23/2016      PT End of Session - 12/23/16 1608    Visit Number 2   Number of Visits 12   Date for PT Re-Evaluation 02/02/17   PT Start Time 1315   PT Stop Time 1410   PT Time Calculation (min) 55 min   Activity Tolerance Patient tolerated treatment well   Behavior During Therapy [facility] for tasks assessed/performed      Past Medical History:  Diagnosis Date  . Allergy   . Diabetes mellitus without complication [facility])     Past Surgical History:  Procedure Laterality Date  . ESOPHAGOGASTRODUODENOSCOPY (EGD) WITH PROPOFOL N/A 05/09/2015   Procedure: ESOPHAGOGASTRODUODENOSCOPY (EGD) WITH PROPOFOL;  Surgeon: Iva Boop, MD;  Location: [facility];  Service: Endoscopy;  Laterality: N/A;  with esophageal dilation  . LASER ABLATION Left 02/03/15  . VARICOSE VEIN SURGERY      There were no vitals filed for this visit.      Subjective Assessment - 12/23/16 1517    Subjective Pt states doing HEP.    Currently in Pain? Yes   Pain Score 2    Pain Location Back   Pain Orientation Mid   Pain Descriptors / Indicators Aching;Tightness   Pain Type Acute pain   Pain Onset 1 to 4 weeks ago   Pain Frequency Intermittent            [facility] PT Assessment - 12/23/16 0001      Assessment   Medical Diagnosis Back pain/thoracic pain   Referring Provider Gaspar Bidding   Next MD Visit 01/27/17     Precautions   Precautions None     Balance Screen   Has the patient fallen in the past 6 months Yes   How many times? 2   Has the patient had a decrease in activity level because of a fear of falling?  Yes   Is the patient reluctant to leave their home because of a fear of falling?  No      Prior Function   Level of Independence Independent     ROM / Strength   AROM / PROM / Strength AROM     AROM   Overall AROM Comments R hip: mild limitations,  L hip: significant limitations for all motions   Lumbar Flexion --  mod limitation   Lumbar Extension --  Mod/significant limitation   Lumbar - Right Side Bend --  min limitation   Lumbar - Left Side Bend --  min limitation     Flexibility   Soft Tissue Assessment /Muscle Length yes   Hamstrings limited   Quadratus Lumborum limited     Special Tests    Special Tests --  Negative Neural tension                     [facility] Adult PT Treatment/Exercise - 12/23/16 1528      Exercises   Exercises --  Also performed: seated thoracic rotation x20     Lumbar Exercises: Stretches   Active Hamstring Stretch 3 reps;30 seconds   Active Hamstring Stretch Limitations Seated   Single Knee to Chest Stretch 3 reps;30 seconds   Single Knee to Chest Stretch Limitations bil   Double  Knee to Chest Stretch 5 reps;10 seconds   Double Knee to Chest Stretch Limitations Seated ball rolls for lumbar flexion   Lower Trunk Rotation 5 reps;10 seconds     Lumbar Exercises: Standing   Other Standing Lumbar Exercises QL stretch- 30 sec x3 bil     Lumbar Exercises: Seated   Other Seated Lumbar Exercises Thoracic/Lumbar rotation x20      Modalities   Modalities Moist Heat     Moist Heat Therapy   Number Minutes Moist Heat 10 Minutes   Moist Heat Location Lumbar Spine  supine     Manual Therapy   Manual Therapy Joint mobilization;Soft tissue mobilization   Joint Mobilization thoracic and lumbar PA mobs , grade 2 and 3   Soft tissue mobilization STM/DTM to Bil thoracic paraspinals x8 min                PT Education - 12/23/16 1527    Education provided Yes   Education Details HEP review   Person(s) Educated Patient   Methods Explanation   Comprehension Verbalized understanding          PT Short Term  Goals - 12/22/16 1640      PT SHORT TERM GOAL #1   Title Pt to be independent with initial HEP   Time 2   Period Weeks   Target Date 01/05/17           PT Long Term Goals - 12/22/16 1640      PT LONG TERM GOAL #1   Title Pt to report decreased pain in thoracic/lumbar region, to 0-2/10 with bending and lifting type activities, to improve ability for return to work duties    Time 6   Period Weeks   Status New   Target Date 02/02/17     PT LONG TERM GOAL #2   Title Pt to demo improved lumbar/thoracic ROM to [facility], to improve ability for bending and work duties    Time 6   Period Weeks   Status New   Target Date 02/02/17     PT LONG TERM GOAL #3   Title Pt to be independent with final HEP for long term maintenance of pain and tightness, and to improve safety at work.    Time 6   Period Weeks   Status New   Target Date 02/02/17               Plan - 12/23/16 1608    Clinical Impression Statement Pt progressed with ther ex for stretching and ROM of thoracic and lumbar spine today. Reviewed HEP and proper mechanics. Pt educated on seated posture, as he has tendency for poor seated posture with  thoraic kyphosis. STM done for tightness at bil paraspinals, significant tightness on L vs R.    PT Treatment/Interventions ADLs/Self Care Home Management;Cryotherapy;Electrical Stimulation;Moist Heat;Ultrasound;Neuromuscular re-education;Therapeutic exercise;Therapeutic activities;Functional mobility training;Gait training;Patient/family education;Manual techniques;Passive range of motion;Taping;Dry needling   PT Home Exercise Plan SKTC; piriformis/supine; standing QL;  LTR;  Seated thoracic rotation, seated fwd lumbar flexion       Patient will benefit from skilled therapeutic intervention in order to improve the following deficits and impairments:  Abnormal gait, Hypomobility, Pain, Increased muscle spasms, Decreased range of motion, Hypermobility, Impaired flexibility  Visit  Diagnosis: Pain in thoracic spine     Problem List Patient Active Problem List   Diagnosis Date Noted  . Pain of left heel 12/21/2016  . Lumbar spondylosis 12/02/2016  . Dysphagia   .  Diabetes (HCC) 05/07/2015  . Bacteremia 05/07/2015  . Acute bronchitis 05/06/2015  . Headache   . SOB (shortness of breath)   . Varicose veins of leg with complications 03/18/2015  . Varicose veins of lower extremities with complications 09/09/2014  . Brain contusion (HCC) 09/21/2012  . Subarachnoid hemorrhage (HCC) 09/21/2012  . Concussion with loss of consciousness 09/21/2012  . RHINOSINUSITIS, ACUTE 12/15/2007  . OCCUPATIONAL ASTHMA 07/16/2007  . SWELLING, MASS, OR LUMP IN CHEST 07/16/2007  . DYSPNEA ON EXERTION 05/18/2007  . HYPOGONADISM 05/10/2007  . ASTHMA 05/10/2007  . ESOPHAGEAL REFLUX 05/10/2007  . KLINEFELTERS SYNDROME 05/10/2007  . COUGH 05/10/2007  . OTHER NONSPECIFIC FINDING EXAMINATION OF URINE 05/10/2007   Sedalia Muta, PT, DPT 4:16 PM  12/23/16    [facility] [address] [place], [place], [address] Phone: [phone]   Fax:  [phone]  Name: STEFANO TRULSON MRN: [id number] Date of Birth: 16-Sep-1960

## 2016-12-23 NOTE — Therapy (Signed)
[facility] [address] [place], [place], [address] Phone: [phone]   Fax:  [phone]  Physical Therapy Evaluation  Patient Details  Name: [name] MRN: [id number] Date of Birth: Oct 03, 1960 Referring Provider: Gaspar Bidding  Encounter Date: 12/22/2016      PT End of Session - 12/22/16 1634    Visit Number 1   Number of Visits 12   Date for PT Re-Evaluation 02/02/17   PT Start Time 1430   PT Stop Time 1512   PT Time Calculation (min) 42 min   Activity Tolerance Patient tolerated treatment well   Behavior During Therapy [facility] for tasks assessed/performed      Past Medical History:  Diagnosis Date  . Allergy   . Diabetes mellitus without complication [facility])     Past Surgical History:  Procedure Laterality Date  . ESOPHAGOGASTRODUODENOSCOPY (EGD) WITH PROPOFOL N/A 05/09/2015   Procedure: ESOPHAGOGASTRODUODENOSCOPY (EGD) WITH PROPOFOL;  Surgeon: Iva Boop, MD;  Location: [facility];  Service: Endoscopy;  Laterality: N/A;  with esophageal dilation  . LASER ABLATION Left 02/03/15  . VARICOSE VEIN SURGERY      There were no vitals filed for this visit.       Subjective Assessment - 12/22/16 1429    Pertinent History Pt had fall on steps at home, about 3 weeks ago. Pt usually works full time, now is on light duty.  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  Required to hook up trailors, also required to lift. He has not had previous back pain.    Currently in Pain? Yes   Pain Score 5    Pain Location Back   Pain Orientation Mid   Pain Descriptors / Indicators Aching;Tightness   Pain Type Acute pain   Pain Onset 1 to 4 weeks ago   Pain Frequency Intermittent   Aggravating Factors  Bending, lifting    Pain Relieving Factors Laying down            [facility] PT Assessment - 12/23/16 0001      Assessment   Medical Diagnosis Back pain/thoracic pain   Referring Provider Gaspar Bidding   Next MD Visit  01/27/17     Precautions   Precautions None     Balance Screen   Has the patient fallen in the past 6 months Yes   How many times? 2   Has the patient had a decrease in activity level because of a fear of falling?  Yes   Is the patient reluctant to leave their home because of a fear of falling?  No     Prior Function   Level of Independence Independent     ROM / Strength   AROM / PROM / Strength AROM     AROM   Overall AROM Comments R hip: mild limitations,  L hip: significant limitations for all motions   Lumbar Flexion --  mod limitation   Lumbar Extension --  Mod/significant limitation   Lumbar - Right Side Bend --  min limitation   Lumbar - Left Side Bend --  min limitation     Flexibility   Soft Tissue Assessment /Muscle Length yes   Hamstrings limited   Quadratus Lumborum limited     Special Tests    Special Tests --  Negative Neural tension            Objective measurements completed on examination: See above findings.  [facility] Adult PT Treatment/Exercise - 12/23/16 0001      Ambulation/Gait   Ambulation/Gait Yes   Gait Pattern Antalgic  Possbily due to pain in foot/heel     Exercises   Exercises Lumbar     Lumbar Exercises: Stretches   Active Hamstring Stretch 3 reps;30 seconds   Active Hamstring Stretch Limitations Seated   Single Knee to Chest Stretch 3 reps;30 seconds   Single Knee to Chest Stretch Limitations bil   Lower Trunk Rotation 5 reps;10 seconds     Lumbar Exercises: Standing   Other Standing Lumbar Exercises QL stretch- 30 sec x3 bil     Lumbar Exercises: Seated   Other Seated Lumbar Exercises Thoracic/Lumbar rotation x20                 PT Education - 12/22/16 1634    Education provided Yes   Education Details Initial HEP   Person(s) Educated Patient   Methods Explanation;Handout   Comprehension Verbalized understanding;Verbal cues required;Need further instruction          PT Short Term Goals -  12/22/16 1640      PT SHORT TERM GOAL #1   Title Pt to be independent with initial HEP   Time 2   Period Weeks   Target Date 01/05/17           PT Long Term Goals - 12/22/16 1640      PT LONG TERM GOAL #1   Title Pt to report decreased pain in thoracic/lumbar region, to 0-2/10 with bending and lifting type activities, to improve ability for return to work duties    Time 6   Period Weeks   Status New   Target Date 02/02/17     PT LONG TERM GOAL #2   Title Pt to demo improved lumbar/thoracic ROM to be [facility], to improve ability for bending and work duties    Time 6   Period Weeks   Status New   Target Date 02/02/17     PT LONG TERM GOAL #3   Title Pt to be independent with final HEP for long term maintenance of pain and tightness, and to improve safety at work.    Time 6   Period Weeks   Status New   Target Date 02/02/17                Plan - 12/22/16 1635    Clinical Impression Statement Pt presents with primary complaint of increased pain in thoracic region, from fall a few weeks ago on stairs. He has increased pain in mid/low thoracic region, in central spine, as well as surrounding muscle tightness and soreness in paraspinals and QL. Pt with decreased lumbar and thoracic ROM, and hypomobility of L hip (that is longstanding). He has poor seated and standing posture, and wil benefit from education on body mechanics. Pt to benefit from skilled care to improve pain, and return to PLOF without deficit Pt anxious to return to full duty work.    Clinical Presentation Stable   Clinical Decision Making Low   Rehab Potential Good   PT Frequency 2x / week   PT Duration 6 weeks   PT Treatment/Interventions ADLs/Self Care Home Management;Cryotherapy;Electrical Stimulation;Moist Heat;Ultrasound;Neuromuscular re-education;Therapeutic exercise;Therapeutic activities;Functional mobility training;Gait training;Patient/family education;Manual techniques;Passive range of  motion;Taping;Dry needling   PT Next Visit Plan Progress HEP/ther ex for lumbar /thoracic ROM,    PT Home Exercise Plan SKTC; piriformis/supine; standing QL;  LTR;  Seated thoracic rotation   Consulted and  Agree with Plan of Care Patient      Patient will benefit from skilled therapeutic intervention in order to improve the following deficits and impairments:  Abnormal gait, Hypomobility, Pain, Increased muscle spasms, Decreased range of motion, Hypermobility, Impaired flexibility  Visit Diagnosis: Pain in thoracic spine - Plan: PT plan of care cert/re-cert     Problem List Patient Active Problem List   Diagnosis Date Noted  . Pain of left heel 12/21/2016  . Lumbar spondylosis 12/02/2016  . Dysphagia   . Diabetes (HCC) 05/07/2015  . Bacteremia 05/07/2015  . Acute bronchitis 05/06/2015  . Headache   . SOB (shortness of breath)   . Varicose veins of leg with complications 03/18/2015  . Varicose veins of lower extremities with complications 09/09/2014  . Brain contusion (HCC) 09/21/2012  . Subarachnoid hemorrhage (HCC) 09/21/2012  . Concussion with loss of consciousness 09/21/2012  . RHINOSINUSITIS, ACUTE 12/15/2007  . OCCUPATIONAL ASTHMA 07/16/2007  . SWELLING, MASS, OR LUMP IN CHEST 07/16/2007  . DYSPNEA ON EXERTION 05/18/2007  . HYPOGONADISM 05/10/2007  . ASTHMA 05/10/2007  . ESOPHAGEAL REFLUX 05/10/2007  . KLINEFELTERS SYNDROME 05/10/2007  . COUGH 05/10/2007  . OTHER NONSPECIFIC FINDING EXAMINATION OF URINE 05/10/2007   Sedalia MutaLauren Porshea Janowski, PT, DPT 9:24 AM  12/23/16    [facility] [address] [place], [place], [address] Phone: [phone]   Fax:  [phone]  Name: Rondel JumboWilliam G Zerby MRN: [id number] Date of Birth: [birth year]

## 2016-12-29 ENCOUNTER — Ambulatory Visit (INDEPENDENT_AMBULATORY_CARE_PROVIDER_SITE_OTHER): Payer: BLUE CROSS/BLUE SHIELD | Admitting: Physical Therapy

## 2016-12-29 DIAGNOSIS — M546 Pain in thoracic spine: Secondary | ICD-10-CM | POA: Diagnosis not present

## 2016-12-29 NOTE — Therapy (Signed)
[facility] [address] [place], [place], [address] Phone: [phone]   Fax:  [phone]  Physical Therapy Treatment  Patient Details  Name: [name] MRN: [id number] Date of Birth: 1961/02/17 Referring Provider: Gaspar BiddingMichael Rigby  Encounter Date: 12/29/2016      PT End of Session - 12/29/16 2116    Visit Number 3   Number of Visits 12   Date for PT Re-Evaluation 02/02/17   PT Start Time 1512   PT Stop Time 1555   PT Time Calculation (min) 43 min      Past Medical History:  Diagnosis Date  . Allergy   . Diabetes mellitus without complication [facility](HCC)     Past Surgical History:  Procedure Laterality Date  . ESOPHAGOGASTRODUODENOSCOPY (EGD) WITH PROPOFOL N/A 05/09/2015   Procedure: ESOPHAGOGASTRODUODENOSCOPY (EGD) WITH PROPOFOL;  Surgeon: Iva Booparl E Gessner, MD;  Location: [facility];  Service: Endoscopy;  Laterality: N/A;  with esophageal dilation  . LASER ABLATION Left 02/03/15  . VARICOSE VEIN SURGERY      There were no vitals filed for this visit.      Subjective Assessment - 12/29/16 2114    Subjective Pt states decreased pain since last week, he has been doing HEP.    Pain Score 0-No pain                         [facility] Adult PT Treatment/Exercise - 12/29/16 1511      Exercises   Exercises --  Also performed: seated thoracic rotation x20     Lumbar Exercises: Stretches   Active Hamstring Stretch 3 reps;30 seconds   Active Hamstring Stretch Limitations Seated   Single Knee to Chest Stretch 3 reps;30 seconds   Single Knee to Chest Stretch Limitations bil   Double Knee to Chest Stretch 5 reps;10 seconds   Double Knee to Chest Stretch Limitations Seated ball rolls for lumbar flexion   Lower Trunk Rotation 5 reps;10 seconds     Lumbar Exercises: Standing   Other Standing Lumbar Exercises QL stretch- 30 sec x3 bil     Lumbar Exercises: Seated   Other Seated Lumbar Exercises Thoracic rotation  x20 ; Thoracic extension x20     Modalities   Modalities --     Moist Heat Therapy   Moist Heat Location --  supine     Manual Therapy   Manual Therapy Joint mobilization;Soft tissue mobilization   Joint Mobilization thoracic and lumbar PA mobs , grade 2 and 3   Soft tissue mobilization STM/DTM to R thoracic paraspinals                PT Education - 12/29/16 2115    Education provided Yes   Education Details Education on continued need for stretching ,and on posture    Person(s) Educated Patient   Methods Explanation   Comprehension Verbalized understanding;Need further instruction          PT Short Term Goals - 12/22/16 1640      PT SHORT TERM GOAL #1   Title Pt to be independent with initial HEP   Time 2   Period Weeks   Target Date 01/05/17           PT Long Term Goals - 12/22/16 1640      PT LONG TERM GOAL #1   Title Pt to report decreased pain in thoracic/lumbar region, to 0-2/10 with bending and lifting type activities, to improve ability for  return to work duties    Time 6   Period Weeks   Status New   Target Date 02/02/17     PT LONG TERM GOAL #2   Title Pt to demo improved lumbar/thoracic ROM to be [facility], to improve ability for bending and work duties    Time 6   Period Weeks   Status New   Target Date 02/02/17     PT LONG TERM GOAL #3   Title Pt to be independent with final HEP for long term maintenance of pain and tightness, and to improve safety at work.    Time 6   Period Weeks   Status New   Target Date 02/02/17               Plan - 12/29/16 2117    Clinical Impression Statement Pt with tightness at R paraspinal with increased pain, addressed with STM and stretching today. Pt has pain to palpate this area, but no pain with ther ex, and improving pain with activity. Pt progressing well. Plan to progress core strength as tolerated next week.    PT Treatment/Interventions ADLs/Self Care Home Management;Cryotherapy;Electrical  Stimulation;Moist Heat;Ultrasound;Neuromuscular re-education;Therapeutic exercise;Therapeutic activities;Functional mobility training;Gait training;Patient/family education;Manual techniques;Passive range of motion;Taping;Dry needling   PT Next Visit Plan Core strength   PT Home Exercise Plan SKTC; piriformis/supine; standing QL;  LTR;  Seated thoracic rotation, seated fwd lumbar flexion       Patient will benefit from skilled therapeutic intervention in order to improve the following deficits and impairments:  Abnormal gait, Hypomobility, Pain, Increased muscle spasms, Decreased range of motion, Hypermobility, Impaired flexibility  Visit Diagnosis: Pain in thoracic spine     Problem List Patient Active Problem List   Diagnosis Date Noted  . Pain of left heel 12/21/2016  . Lumbar spondylosis 12/02/2016  . Dysphagia   . Diabetes (HCC) 05/07/2015  . Bacteremia 05/07/2015  . Acute bronchitis 05/06/2015  . Headache   . SOB (shortness of breath)   . Varicose veins of leg with complications 03/18/2015  . Varicose veins of lower extremities with complications 09/09/2014  . Brain contusion (HCC) 09/21/2012  . Subarachnoid hemorrhage (HCC) 09/21/2012  . Concussion with loss of consciousness 09/21/2012  . RHINOSINUSITIS, ACUTE 12/15/2007  . OCCUPATIONAL ASTHMA 07/16/2007  . SWELLING, MASS, OR LUMP IN CHEST 07/16/2007  . DYSPNEA ON EXERTION 05/18/2007  . HYPOGONADISM 05/10/2007  . ASTHMA 05/10/2007  . ESOPHAGEAL REFLUX 05/10/2007  . KLINEFELTERS SYNDROME 05/10/2007  . COUGH 05/10/2007  . OTHER NONSPECIFIC FINDING EXAMINATION OF URINE 05/10/2007   Sedalia Muta, PT, DPT 9:25 PM  12/29/16    [facility] [address] [place], [place], [address] Phone: [phone]   Fax:  [phone]  Name: [name] MRN: [id number] Date of Birth: March 21, 1961

## 2017-01-03 ENCOUNTER — Encounter: Payer: Self-pay | Admitting: Physical Therapy

## 2017-01-03 ENCOUNTER — Ambulatory Visit (INDEPENDENT_AMBULATORY_CARE_PROVIDER_SITE_OTHER): Payer: BLUE CROSS/BLUE SHIELD | Admitting: Physical Therapy

## 2017-01-03 DIAGNOSIS — M546 Pain in thoracic spine: Secondary | ICD-10-CM | POA: Diagnosis not present

## 2017-01-04 NOTE — Therapy (Signed)
[facility] [address] [place], [place], [address] Phone: [phone]   Fax:  [phone]  Physical Therapy Treatment  Patient Details  Name: [name] MRN: [id number] Date of Birth: [birth year] Referring Provider: Gaspar Bidding  Encounter Date: 01/03/2017      PT End of Session - 01/03/17 1637    Visit Number 4   Number of Visits 12   Date for PT Re-Evaluation 02/02/17   PT Start Time 1600   PT Stop Time 1653   PT Time Calculation (min) 53 min      Past Medical History:  Diagnosis Date  . Allergy   . Diabetes mellitus without complication [facility])     Past Surgical History:  Procedure Laterality Date  . ESOPHAGOGASTRODUODENOSCOPY (EGD) WITH PROPOFOL N/A 05/09/2015   Procedure: ESOPHAGOGASTRODUODENOSCOPY (EGD) WITH PROPOFOL;  Surgeon: Iva Boop, MD;  Location: [facility];  Service: Endoscopy;  Laterality: N/A;  with esophageal dilation  . LASER ABLATION Left 02/03/15  . VARICOSE VEIN SURGERY      There were no vitals filed for this visit.      Subjective Assessment - 01/03/17 1602    Subjective Pt states that he has been feeling better, minimal pain. Has been doing HEP.  He is still working light duty    Limitations --   Currently in Pain? No/denies   Multiple Pain Sites No                         [facility] Adult PT Treatment/Exercise - 01/03/17 1654      Exercises   Exercises Knee/Hip  Also performed: seated thoracic rotation x20     Lumbar Exercises: Stretches   Active Hamstring Stretch 3 reps;30 seconds   Active Hamstring Stretch Limitations Seated   Single Knee to Chest Stretch 3 reps;30 seconds   Single Knee to Chest Stretch Limitations bil   Double Knee to Chest Stretch 5 reps;10 seconds   Double Knee to Chest Stretch Limitations Seated ball rolls for lumbar flexion   Lower Trunk Rotation 5 reps;10 seconds     Lumbar Exercises: Standing   Other Standing Lumbar Exercises QL  stretch- 30 sec x3 bil     Lumbar Exercises: Seated   Other Seated Lumbar Exercises Thoracic rotation x20 ; Thoracic extension x20     Knee/Hip Exercises: Standing   Hip Flexion 2 sets;15 reps   Hip Flexion Limitations Marching   Functional Squat 2 sets;10 reps   Functional Squat Limitations With education on body mechanics for work duties     Moist Heat Therapy   Moist Heat Location --  supine     Manual Therapy   Manual Therapy Joint mobilization;Soft tissue mobilization   Joint Mobilization thoracic and lumbar PA mobs , grade 2 and 3   Soft tissue mobilization STM/DTM to R thoracic paraspinals                PT Education - 01/03/17 1604    Education provided Yes   Education Details Prognosis and POC, body mechanics with work duties and [employer] logistics/support/administrative officer) Educated Patient   Methods Explanation   Comprehension Verbalized understanding          PT Short Term Goals - 01/03/17 1604      PT SHORT TERM GOAL #1   Title Pt to be independent with initial HEP   Status Achieved           PT  Long Term Goals - 12/22/16 1640      PT LONG TERM GOAL #1   Title Pt to report decreased pain in thoracic/lumbar region, to 0-2/10 with bending and lifting type activities, to improve ability for return to work duties    Time 6   Period Weeks   Status New   Target Date 02/02/17     PT LONG TERM GOAL #2   Title Pt to demo improved lumbar/thoracic ROM to be [facility], to improve ability for bending and work duties    Time 6   Period Weeks   Status New   Target Date 02/02/17     PT LONG TERM GOAL #3   Title Pt to be independent with final HEP for long term maintenance of pain and tightness, and to improve safety at work.    Time 6   Period Weeks   Status New   Target Date 02/02/17               Plan - 01/04/17 0932    Clinical Impression Statement Pt making good progress with pain, he has been able to progress ther ex and work duties without pain in back. He does  have mild soreness to R paraspinal region with palpation and manual today. He continues to demonstrate stiffness in lumbar region, which he will benefit from continued stretching and mobility for.  He demonstrates ability for proper lifting mechanics today after education and practice. Plan to progress strength as tolerated and work towards d/c to HEP in next couple weeks.    PT Treatment/Interventions ADLs/Self Care Home Management;Cryotherapy;Electrical Stimulation;Moist Heat;Ultrasound;Neuromuscular re-education;Therapeutic exercise;Therapeutic activities;Functional mobility training;Gait training;Patient/family education;Manual techniques;Passive range of motion;Taping;Dry needling   PT Next Visit Plan Core strength   PT Home Exercise Plan SKTC; piriformis/supine; standing QL;  LTR;  Seated thoracic rotation, seated fwd lumbar flexion       Patient will benefit from skilled therapeutic intervention in order to improve the following deficits and impairments:  Abnormal gait, Hypomobility, Pain, Increased muscle spasms, Decreased range of motion, Hypermobility, Impaired flexibility  Visit Diagnosis: Pain in thoracic spine     Problem List Patient Active Problem List   Diagnosis Date Noted  . Pain of left heel 12/21/2016  . Lumbar spondylosis 12/02/2016  . Dysphagia   . Diabetes (HCC) 05/07/2015  . Bacteremia 05/07/2015  . Acute bronchitis 05/06/2015  . Headache   . SOB (shortness of breath)   . Varicose veins of leg with complications 03/18/2015  . Varicose veins of lower extremities with complications 09/09/2014  . Brain contusion (HCC) 09/21/2012  . Subarachnoid hemorrhage (HCC) 09/21/2012  . Concussion with loss of consciousness 09/21/2012  . RHINOSINUSITIS, ACUTE 12/15/2007  . OCCUPATIONAL ASTHMA 07/16/2007  . SWELLING, MASS, OR LUMP IN CHEST 07/16/2007  . DYSPNEA ON EXERTION 05/18/2007  . HYPOGONADISM 05/10/2007  . ASTHMA 05/10/2007  . ESOPHAGEAL REFLUX 05/10/2007  .  KLINEFELTERS SYNDROME 05/10/2007  . COUGH 05/10/2007  . OTHER NONSPECIFIC FINDING EXAMINATION OF URINE 05/10/2007   Sedalia Muta, PT, DPT 9:35 AM  01/04/17    [facility] [address] [place], [place], [address] Phone: [phone]   Fax:  [phone]  Name: [name] MRN: [id number] Date of Birth: Jun 19, 1960

## 2017-01-05 ENCOUNTER — Telehealth: Payer: Self-pay | Admitting: Sports Medicine

## 2017-01-05 ENCOUNTER — Other Ambulatory Visit: Payer: Self-pay | Admitting: Sports Medicine

## 2017-01-05 ENCOUNTER — Ambulatory Visit (INDEPENDENT_AMBULATORY_CARE_PROVIDER_SITE_OTHER): Payer: BLUE CROSS/BLUE SHIELD | Admitting: Physical Therapy

## 2017-01-05 ENCOUNTER — Encounter: Payer: Self-pay | Admitting: Physical Therapy

## 2017-01-05 DIAGNOSIS — M546 Pain in thoracic spine: Secondary | ICD-10-CM

## 2017-01-05 LAB — HM DIABETES EYE EXAM

## 2017-01-05 NOTE — Therapy (Signed)
[facility] [address] [place], [place], [address] Phone: [phone]   Fax:  [phone]  Physical Therapy Treatment  Patient Details  Name: [name] MRN: [id number] Date of Birth: [birth year] Referring Provider: Gaspar Bidding  Encounter Date: 01/05/2017      PT End of Session - 01/05/17 1604    Visit Number 5   Number of Visits 12   Date for PT Re-Evaluation 02/02/17   PT Start Time 1558   PT Stop Time 1638   PT Time Calculation (min) 40 min   Activity Tolerance Patient tolerated treatment well   Behavior During Therapy [facility] for tasks assessed/performed      Past Medical History:  Diagnosis Date  . Allergy   . Diabetes mellitus without complication [facility])     Past Surgical History:  Procedure Laterality Date  . ESOPHAGOGASTRODUODENOSCOPY (EGD) WITH PROPOFOL N/A 05/09/2015   Procedure: ESOPHAGOGASTRODUODENOSCOPY (EGD) WITH PROPOFOL;  Surgeon: Iva Boop, MD;  [facility];  Service: Endoscopy;  Laterality: N/A;  with esophageal dilation  . LASER ABLATION Left 02/03/15  . VARICOSE VEIN SURGERY      There were no vitals filed for this visit.      Subjective Assessment - 01/05/17 1602    Subjective Pt states no new complaints. He continues to have minimal pain in back.    Pain Score 0-No pain                         [facility] Adult PT Treatment/Exercise - 01/05/17 1604      Exercises   Exercises Knee/Hip  Also performed: seated thoracic rotation x20     Lumbar Exercises: Stretches   Active Hamstring Stretch 3 reps;30 seconds   Active Hamstring Stretch Limitations Seated   Single Knee to Chest Stretch 3 reps;30 seconds   Single Knee to Chest Stretch Limitations bil   Double Knee to Chest Stretch 5 reps;10 seconds   Double Knee to Chest Stretch Limitations Seated ball rolls for lumbar flexion   Lower Trunk Rotation 5 reps;10 seconds     Lumbar Exercises: Standing   Other  Standing Lumbar Exercises QL stretch- 30 sec x3 bil     Lumbar Exercises: Seated   Other Seated Lumbar Exercises Thoracic rotation x20 ;      Knee/Hip Exercises: Standing   Hip Flexion 2 sets;15 reps   Hip Flexion Limitations Marching   Hip Abduction 2 sets;10 reps;Both   Functional Squat 2 sets;10 reps   Functional Squat Limitations --     Moist Heat Therapy   Moist Heat Location --  supine     Manual Therapy   Manual Therapy --   Joint Mobilization --   Soft tissue mobilization --                PT Education - 01/05/17 1603    Education provided Yes   Education Details POC and plan for d/c    Person(s) Educated Patient   Methods Explanation   Comprehension Verbalized understanding          PT Short Term Goals - 01/03/17 1604      PT SHORT TERM GOAL #1   Title Pt to be independent with initial HEP   Status Achieved           PT Long Term Goals - 12/22/16 1640      PT LONG TERM GOAL #1   Title Pt to report decreased  pain in thoracic/lumbar region, to 0-2/10 with bending and lifting type activities, to improve ability for return to work duties    Time 6   Period Weeks   Status New   Target Date 02/02/17     PT LONG TERM GOAL #2   Title Pt to demo improved lumbar/thoracic ROM to be [facility], to improve ability for bending and work duties    Time 6   Period Weeks   Status New   Target Date 02/02/17     PT LONG TERM GOAL #3   Title Pt to be independent with final HEP for long term maintenance of pain and tightness, and to improve safety at work.    Time 6   Period Weeks   Status New   Target Date 02/02/17               Plan - 01/05/17 1652    Clinical Impression Statement Pt progressing well, has no pain today with activities, only feels tightness with stretches on R sided thoracic region. Plan to d/c at next visit. Pt anxious to return to full duty work, will discuss with MD for pt follow up.    PT Treatment/Interventions ADLs/Self Care  Home Management;Cryotherapy;Electrical Stimulation;Moist Heat;Ultrasound;Neuromuscular re-education;Therapeutic exercise;Therapeutic activities;Functional mobility training;Gait training;Patient/family education;Manual techniques;Passive range of motion;Taping;Dry needling   PT Next Visit Plan Core strength   PT Home Exercise Plan SKTC; piriformis/supine; standing QL;  LTR;  Seated thoracic rotation, seated fwd lumbar flexion       Patient will benefit from skilled therapeutic intervention in order to improve the following deficits and impairments:  Abnormal gait, Hypomobility, Pain, Increased muscle spasms, Decreased range of motion, Hypermobility, Impaired flexibility  Visit Diagnosis: Pain in thoracic spine     Problem List Patient Active Problem List   Diagnosis Date Noted  . Pain of left heel 12/21/2016  . Lumbar spondylosis 12/02/2016  . Dysphagia   . Diabetes (HCC) 05/07/2015  . Bacteremia 05/07/2015  . Acute bronchitis 05/06/2015  . Headache   . SOB (shortness of breath)   . Varicose veins of leg with complications 03/18/2015  . Varicose veins of lower extremities with complications 09/09/2014  . Brain contusion (HCC) 09/21/2012  . Subarachnoid hemorrhage (HCC) 09/21/2012  . Concussion with loss of consciousness 09/21/2012  . RHINOSINUSITIS, ACUTE 12/15/2007  . OCCUPATIONAL ASTHMA 07/16/2007  . SWELLING, MASS, OR LUMP IN CHEST 07/16/2007  . DYSPNEA ON EXERTION 05/18/2007  . HYPOGONADISM 05/10/2007  . ASTHMA 05/10/2007  . ESOPHAGEAL REFLUX 05/10/2007  . KLINEFELTERS SYNDROME 05/10/2007  . COUGH 05/10/2007  . OTHER NONSPECIFIC FINDING EXAMINATION OF URINE 05/10/2007    Sedalia Muta, PT, DPT 4:53 PM  01/05/17   [facility] [address] [place], [place], [address] Phone: [phone]   Fax:  [phone]  Name: [name] MRN: [id number] Date of Birth: [birth year]

## 2017-01-05 NOTE — Telephone Encounter (Signed)
Lauren will give him for at his appointment today.

## 2017-01-05 NOTE — Telephone Encounter (Signed)
Patient called to get his handicap sticker extended for a longer time. I explained that he may be asked to come into the office for a follow up appointment first and it may not be available when he comes in the office for his appointment today at 4 for PT.   Call patient to notify if an appointment is needed before the extension is approved or denied.

## 2017-01-05 NOTE — Telephone Encounter (Signed)
Last OV 12/16/2016. Forwarding to Dr. Berline Chough to advise.

## 2017-01-05 NOTE — Telephone Encounter (Signed)
6 months

## 2017-01-11 DIAGNOSIS — E119 Type 2 diabetes mellitus without complications: Secondary | ICD-10-CM | POA: Diagnosis not present

## 2017-01-11 DIAGNOSIS — H52223 Regular astigmatism, bilateral: Secondary | ICD-10-CM | POA: Diagnosis not present

## 2017-01-12 ENCOUNTER — Ambulatory Visit (INDEPENDENT_AMBULATORY_CARE_PROVIDER_SITE_OTHER): Payer: BLUE CROSS/BLUE SHIELD | Admitting: Physical Therapy

## 2017-01-12 ENCOUNTER — Encounter: Payer: Self-pay | Admitting: Physical Therapy

## 2017-01-12 DIAGNOSIS — M546 Pain in thoracic spine: Secondary | ICD-10-CM

## 2017-01-13 NOTE — Therapy (Signed)
[facility] [address] [place], [place], [address] Phone: [phone]   Fax:  [phone]  Physical Therapy Treatment/Discharge  Patient Details  Name: [name] MRN: [id number] Date of Birth: [birth year] Referring Provider: Teresa Coombs  Encounter Date: 01/12/2017      PT End of Session - 01/12/17 1616    Visit Number 6   Number of Visits 12   Date for PT Re-Evaluation 02/02/17   PT Start Time 1500   PT Stop Time 1550   PT Time Calculation (min) 50 min   Activity Tolerance Patient tolerated treatment well   Behavior During Therapy [facility] for tasks assessed/performed      Past Medical History:  Diagnosis Date  . Allergy   . Diabetes mellitus without complication [facility])     Past Surgical History:  Procedure Laterality Date  . ESOPHAGOGASTRODUODENOSCOPY (EGD) WITH PROPOFOL N/A 05/09/2015   Procedure: ESOPHAGOGASTRODUODENOSCOPY (EGD) WITH PROPOFOL;  Surgeon: Gatha Mayer, MD;  [facility];  Service: Endoscopy;  Laterality: N/A;  with esophageal dilation  . LASER ABLATION Left 02/03/15  . VARICOSE VEIN SURGERY      There were no vitals filed for this visit.      Subjective Assessment - 01/12/17 1615    Subjective Pt states that he has had no pain in back. He has been doing regular job duties, still on light duty.    Currently in Pain? No/denies   Pain Score 0-No pain          Lumbar ROM: minimal limitation                [facility] Adult PT Treatment/Exercise - 01/12/17 1618      Exercises   Exercises Knee/Hip  Also performed: seated thoracic rotation x20     Lumbar Exercises: Stretches   Active Hamstring Stretch 3 reps;30 seconds   Active Hamstring Stretch Limitations Seated   Single Knee to Chest Stretch 3 reps;30 seconds   Single Knee to Chest Stretch Limitations bil   Double Knee to Chest Stretch 5 reps;10 seconds   Double Knee to Chest Stretch Limitations Seated ball rolls for  lumbar flexion   Lower Trunk Rotation 5 reps;10 seconds     Lumbar Exercises: Standing   Other Standing Lumbar Exercises QL stretch- 30 sec x3 bil     Lumbar Exercises: Seated   Other Seated Lumbar Exercises Thoracic rotation x20 ;      Knee/Hip Exercises: Standing   Hip Flexion 2 sets;15 reps   Hip Flexion Limitations Marching   Hip Abduction 2 sets;10 reps;Both   Functional Squat 2 sets;10 reps     Moist Heat Therapy   Moist Heat Location --  supine     Manual Therapy   Manual Therapy Joint mobilization;Soft tissue mobilization   Joint Mobilization thoracic and lumbar PA mobs , grade 2 and 3   Soft tissue mobilization STM/DTM to L thoracic paraspinals                PT Education - 01/12/17 1616    Education provided Yes   Education Details Final HEP, handout given   Person(s) Educated Patient   Methods Explanation;Handout   Comprehension Verbalized understanding          PT Short Term Goals - 01/03/17 1604      PT SHORT TERM GOAL #1   Title Pt to be independent with initial HEP   Status Achieved  PT Long Term Goals - 01/12/17 1616      PT LONG TERM GOAL #1   Title Pt to report decreased pain in thoracic/lumbar region, to 0-2/10 with bending and lifting type activities, to improve ability for return to work duties    Time 6   Period Weeks   Status Achieved     PT LONG TERM GOAL #2   Title Pt to demo improved lumbar/thoracic ROM to be [facility], to improve ability for bending and work duties    Time 6   Period Weeks   Status Achieved     PT LONG TERM GOAL #3   Title Pt to be independent with final HEP for long term maintenance of pain and tightness, and to improve safety at work.    Time 6   Period Weeks   Status Achieved               Plan - 01/12/17 1659    Clinical Impression Statement Pt has made good progress. He has been pain free, with sessions and work duties. He has mild stiffness in lumbar spine, that will continue to  be addressed in HEP. Final HEP reviewed today, pt with good understanding. He has gait defiict due to significant ROM restrictions of L hip that have been longstanding. Pt with  no remaining functional deficits that require continued care. Pt ready for d/c to HEP. Pt requests work note from MD today to return to full duty.    PT Treatment/Interventions ADLs/Self Care Home Management;Cryotherapy;Electrical Stimulation;Moist Heat;Ultrasound;Neuromuscular re-education;Therapeutic exercise;Therapeutic activities;Functional mobility training;Gait training;Patient/family education;Manual techniques;Passive range of motion;Taping;Dry needling   PT Next Visit Plan Core strength   PT Home Exercise Plan SKTC; piriformis/supine; standing QL;  LTR;  Seated thoracic rotation, seated fwd lumbar flexion       Patient will benefit from skilled therapeutic intervention in order to improve the following deficits and impairments:  Abnormal gait, Hypomobility, Pain, Increased muscle spasms, Decreased range of motion, Hypermobility, Impaired flexibility  Visit Diagnosis: Pain in thoracic spine       Problem List Patient Active Problem List   Diagnosis Date Noted  . Pain of left heel 12/21/2016  . Lumbar spondylosis 12/02/2016  . Dysphagia   . Diabetes ([place]) 05/07/2015  . Bacteremia 05/07/2015  . Acute bronchitis 05/06/2015  . Headache   . SOB (shortness of breath)   . Varicose veins of leg with complications 99/37/1696  . Varicose veins of lower extremities with complications 78/93/8101  . Brain contusion ([place]) 09/21/2012  . Subarachnoid hemorrhage ([place]) 09/21/2012  . Concussion with loss of consciousness 09/21/2012  . RHINOSINUSITIS, ACUTE 12/15/2007  . OCCUPATIONAL ASTHMA 07/16/2007  . SWELLING, MASS, OR LUMP IN CHEST 07/16/2007  . DYSPNEA ON EXERTION 05/18/2007  . HYPOGONADISM 05/10/2007  . ASTHMA 05/10/2007  . ESOPHAGEAL REFLUX 05/10/2007  . KLINEFELTERS SYNDROME 05/10/2007  . COUGH 05/10/2007   . OTHER NONSPECIFIC FINDING EXAMINATION OF URINE 05/10/2007   Lyndee Hensen, PT, DPT 8:21 AM  01/13/17    [facility] [place], [place], [address] Phone: [phone]   Fax:  [phone]  Name: [name] MRN: [id number] Date of Birth: October 28, 1960   PHYSICAL THERAPY DISCHARGE SUMMARY  Visits from Start of Care: 6  Current functional level related to goals / functional outcomes: See above  Remaining deficits: none   Plan: Patient agrees to discharge.  Patient goals were met. Patient is being discharged due to meeting the stated rehab goals.  ?????

## 2017-01-27 ENCOUNTER — Ambulatory Visit: Payer: BLUE CROSS/BLUE SHIELD | Admitting: Sports Medicine

## 2017-02-09 ENCOUNTER — Ambulatory Visit: Payer: BLUE CROSS/BLUE SHIELD | Admitting: Sports Medicine

## 2017-03-01 DIAGNOSIS — Z0000 Encounter for general adult medical examination without abnormal findings: Secondary | ICD-10-CM | POA: Diagnosis not present

## 2017-03-01 DIAGNOSIS — E291 Testicular hypofunction: Secondary | ICD-10-CM | POA: Diagnosis not present

## 2017-03-01 DIAGNOSIS — E118 Type 2 diabetes mellitus with unspecified complications: Secondary | ICD-10-CM | POA: Diagnosis not present

## 2017-03-01 DIAGNOSIS — Z125 Encounter for screening for malignant neoplasm of prostate: Secondary | ICD-10-CM | POA: Diagnosis not present

## 2017-03-02 DIAGNOSIS — E291 Testicular hypofunction: Secondary | ICD-10-CM | POA: Diagnosis not present

## 2017-03-02 DIAGNOSIS — Z0001 Encounter for general adult medical examination with abnormal findings: Secondary | ICD-10-CM | POA: Diagnosis not present

## 2017-03-07 DIAGNOSIS — K219 Gastro-esophageal reflux disease without esophagitis: Secondary | ICD-10-CM | POA: Diagnosis not present

## 2017-03-07 DIAGNOSIS — E291 Testicular hypofunction: Secondary | ICD-10-CM | POA: Diagnosis not present

## 2017-03-07 DIAGNOSIS — E119 Type 2 diabetes mellitus without complications: Secondary | ICD-10-CM | POA: Diagnosis not present

## 2017-03-16 ENCOUNTER — Ambulatory Visit: Payer: Self-pay | Admitting: Sports Medicine

## 2017-03-16 ENCOUNTER — Ambulatory Visit: Payer: BLUE CROSS/BLUE SHIELD | Admitting: Sports Medicine

## 2017-03-16 ENCOUNTER — Encounter: Payer: Self-pay | Admitting: Sports Medicine

## 2017-03-16 ENCOUNTER — Ambulatory Visit: Payer: Self-pay

## 2017-03-16 VITALS — BP 112/72 | HR 88 | Ht 76.0 in | Wt 233.0 lb

## 2017-03-16 DIAGNOSIS — M79672 Pain in left foot: Secondary | ICD-10-CM | POA: Diagnosis not present

## 2017-03-16 NOTE — Procedures (Signed)

## 2017-03-16 NOTE — Procedures (Signed)
PROCEDURE NOTE -  ULTRASOUND GUIDEDInjection: LEFT Heel Images were obtained and interpreted by myself, Gaspar BiddingMichael Janifer Gieselman, DO  Images have been saved and stored to PACS system. Images obtained on: GE S7 Ultrasound machine  ULTRASOUND FINDINGS:  Markedly thickened plantar fascial origin with thickening of the longitudinal arch as well.  DESCRIPTION OF PROCEDURE:  The patient's clinical condition is marked by substantial pain and/or significant functional disability. Other conservative therapy has not provided relief, is contraindicated, or not appropriate. There is a reasonable likelihood that injection will significantly improve the patient's pain and/or functional impairment.  After discussing the risks, benefits and expected outcomes of the injection and all questions were reviewed and answered, the patient wished to undergo the above named procedure. Verbal consent was obtained.  The ultrasound was used to identify the target structure and adjacent neurovascular structures. The skin was then prepped in sterile fashion and the target structure was injected under direct visualization using sterile technique as below:  Left PREP: Alcohol, Ethel Chloride,  APPROACH: Medial, single injection, 22g 1.5in. INJECTATE: 1cc 0.5% marcaine, 1cc 40mg /mL DepoMedrol  ASPIRATE: N/A DRESSING: Band-Aid    Post procedural instructions including recommending icing and warning signs for infection were reviewed.  This procedure was well tolerated and there were no complications.   IMPRESSION: Succesful US Guided Injection

## 2017-03-16 NOTE — Patient Instructions (Addendum)

## 2017-03-16 NOTE — Progress Notes (Signed)
  Veverly FellsMichael D. Delorise Shinerigby, DO  [facility] [place] [phone]  [name] - 56 y.o. male MRN [id number]  Date of birth: 09-26-1960   Scribe for today's visit: Christoper FabianMolly Weber, ATC    SUBJECTIVE:  [name] is here for Follow-up (L heel pain) .   Compared to the last office visit on 12/16/16, his previously described L heel pain symptoms are worsening.  Pt states that he has increased pain w/ walking. Current symptoms are 7-8/10 when walking. He has been using ice, heat and taking his prescribed medicine.   ROS Denies night time disturbances. Denies fevers, chills, or night sweats. Denies unexplained weight loss. Denies personal history of cancer. Denies changes in bowel or bladder habits. Denies recent unreported falls. Denies new or worsening dyspnea or wheezing. Denies headaches or dizziness.  Denies numbness, tingling or weakness  In the extremities.  Denies dizziness or presyncopal episodes Denies lower extremity edema    HISTORY & PERTINENT PRIOR DATA:  Prior History reviewed and updated per electronic medical record. Significant history, findings, studies and interim changes include: No additional findings.  reports that  has never smoked. he has never used smokeless tobacco. No results for input(s): HGBA1C, LABURIC, CREATINE in the last 8760 hours. No problems updated.   OBJECTIVE:  VS:  HT:6\' 4"  (193 cm)   WT:233 lb (105.7 kg)  BMI:28.37    BP:112/72  HR:88bpm  TEMP: ( )  RESP:96 %  PHYSICAL EXAM: Constitutional: WDWN, Non-toxic appearing. Psychiatric: Alert & appropriately interactive.Not depressed or anxious appearing. Respiratory: No increased work of breathing. Trachea Midline Eyes: Pupils are equal. EOM intact without nystagmus. No scleral icterus   VASCULAR FINDINGS: No clubbing or cyanosis appreciated Capillary Refill is normal, less than 2 seconds LOWER EXTREMITIES: No significant LE venous stasis  changes, No significant pretibial/lower extremity peripheral edema and Bilateral Pedal Pulses: symmetrically palpable  SENSORY FINDINGS: LOWER EXTREMITIES: Intact to light touch in all examined dermatomes  Left foot: Markedly high arch with moderate midfoot rigidity.  He has pain with dorsiflexion and pain with direct palpation of the calcaneus.  No pain with calf squeeze or calcaneal squeeze.  Mild pain with palpation of the Achilles.  This is very mild.  ASSESSMENT & PLAN:   1. Pain of left heel    Plan: Injection performed today per procedure note [place] exercises per procedure note Recommend starting with over-the-counter arch support.  Can consider custom orthotics if any lack of improvement  No problem-specific Assessment & Plan notes found for this encounter.   ++++++++++++++++++++++++++++++++++++++++++++ Orders:  Orders Placed This Encounter  Procedures  . US LIMITED JOINT SPACE STRUCTURES LOW LEFT(NO LINKED CHARGES)    Meds:  No orders of the defined types were placed in this encounter.   ++++++++++++++++++++++++++++++++++++++++++++ Follow-up: Return in about 6 weeks (around 04/27/2017).   Pertinent documentation may be included in additional procedure notes, imaging studies, problem based documentation and patient instructions. Please see these sections of the encounter for additional information regarding this visit. CMA/ATC served as Neurosurgeonscribe during this visit. History, Physical, and Plan performed by medical provider. Documentation and orders reviewed and attested to.      Andrena MewsMichael D Rigby, DO    [facility]

## 2017-03-22 DIAGNOSIS — E291 Testicular hypofunction: Secondary | ICD-10-CM | POA: Diagnosis not present

## 2017-03-22 DIAGNOSIS — Q984 Klinefelter syndrome, unspecified: Secondary | ICD-10-CM | POA: Diagnosis not present

## 2017-03-22 DIAGNOSIS — E119 Type 2 diabetes mellitus without complications: Secondary | ICD-10-CM | POA: Diagnosis not present

## 2017-03-31 DIAGNOSIS — N5201 Erectile dysfunction due to arterial insufficiency: Secondary | ICD-10-CM | POA: Diagnosis not present

## 2017-03-31 DIAGNOSIS — E291 Testicular hypofunction: Secondary | ICD-10-CM | POA: Diagnosis not present

## 2017-04-22 DIAGNOSIS — J019 Acute sinusitis, unspecified: Secondary | ICD-10-CM | POA: Diagnosis not present

## 2017-04-22 DIAGNOSIS — E291 Testicular hypofunction: Secondary | ICD-10-CM | POA: Diagnosis not present

## 2017-04-22 DIAGNOSIS — R749 Abnormal serum enzyme level, unspecified: Secondary | ICD-10-CM | POA: Diagnosis not present

## 2017-04-22 DIAGNOSIS — E118 Type 2 diabetes mellitus with unspecified complications: Secondary | ICD-10-CM | POA: Diagnosis not present

## 2017-04-27 DIAGNOSIS — Z0001 Encounter for general adult medical examination with abnormal findings: Secondary | ICD-10-CM | POA: Diagnosis not present

## 2017-04-27 DIAGNOSIS — Z23 Encounter for immunization: Secondary | ICD-10-CM | POA: Diagnosis not present

## 2017-04-28 DIAGNOSIS — E291 Testicular hypofunction: Secondary | ICD-10-CM | POA: Diagnosis not present

## 2017-05-04 ENCOUNTER — Ambulatory Visit: Payer: BLUE CROSS/BLUE SHIELD | Admitting: Sports Medicine

## 2017-05-04 DIAGNOSIS — E291 Testicular hypofunction: Secondary | ICD-10-CM | POA: Diagnosis not present

## 2017-05-04 DIAGNOSIS — L918 Other hypertrophic disorders of the skin: Secondary | ICD-10-CM | POA: Diagnosis not present

## 2017-05-05 DIAGNOSIS — R161 Splenomegaly, not elsewhere classified: Secondary | ICD-10-CM | POA: Diagnosis not present

## 2017-05-05 DIAGNOSIS — K760 Fatty (change of) liver, not elsewhere classified: Secondary | ICD-10-CM | POA: Diagnosis not present

## 2017-05-05 DIAGNOSIS — R945 Abnormal results of liver function studies: Secondary | ICD-10-CM | POA: Diagnosis not present

## 2017-07-06 DIAGNOSIS — E291 Testicular hypofunction: Secondary | ICD-10-CM | POA: Diagnosis not present

## 2017-07-06 DIAGNOSIS — R972 Elevated prostate specific antigen [PSA]: Secondary | ICD-10-CM | POA: Diagnosis not present

## 2017-08-03 ENCOUNTER — Emergency Department (HOSPITAL_COMMUNITY): Payer: BLUE CROSS/BLUE SHIELD

## 2017-08-03 ENCOUNTER — Encounter (HOSPITAL_COMMUNITY): Payer: Self-pay | Admitting: Emergency Medicine

## 2017-08-03 ENCOUNTER — Inpatient Hospital Stay (HOSPITAL_COMMUNITY): Payer: BLUE CROSS/BLUE SHIELD

## 2017-08-03 ENCOUNTER — Observation Stay (HOSPITAL_COMMUNITY)
Admission: EM | Admit: 2017-08-03 | Discharge: 2017-08-04 | Disposition: A | Discharge status: Home / Self Care | Payer: BLUE CROSS/BLUE SHIELD | Attending: Internal Medicine | Admitting: Internal Medicine

## 2017-08-03 ENCOUNTER — Ambulatory Visit: Payer: Self-pay

## 2017-08-03 DIAGNOSIS — R079 Chest pain, unspecified: Secondary | ICD-10-CM | POA: Insufficient documentation

## 2017-08-03 DIAGNOSIS — R51 Headache: Secondary | ICD-10-CM | POA: Insufficient documentation

## 2017-08-03 DIAGNOSIS — E1165 Type 2 diabetes mellitus with hyperglycemia: Secondary | ICD-10-CM | POA: Diagnosis not present

## 2017-08-03 DIAGNOSIS — E119 Type 2 diabetes mellitus without complications: Secondary | ICD-10-CM | POA: Insufficient documentation

## 2017-08-03 DIAGNOSIS — Q984 Klinefelter syndrome, unspecified: Secondary | ICD-10-CM | POA: Diagnosis not present

## 2017-08-03 DIAGNOSIS — J45998 Other asthma: Secondary | ICD-10-CM | POA: Insufficient documentation

## 2017-08-03 DIAGNOSIS — R410 Disorientation, unspecified: Secondary | ICD-10-CM | POA: Diagnosis not present

## 2017-08-03 DIAGNOSIS — Z820 Family history of epilepsy and other diseases of the nervous system: Secondary | ICD-10-CM | POA: Diagnosis not present

## 2017-08-03 DIAGNOSIS — R531 Weakness: Secondary | ICD-10-CM | POA: Diagnosis not present

## 2017-08-03 DIAGNOSIS — I8393 Asymptomatic varicose veins of bilateral lower extremities: Secondary | ICD-10-CM | POA: Insufficient documentation

## 2017-08-03 DIAGNOSIS — R4781 Slurred speech: Principal | ICD-10-CM | POA: Insufficient documentation

## 2017-08-03 DIAGNOSIS — E785 Hyperlipidemia, unspecified: Secondary | ICD-10-CM

## 2017-08-03 DIAGNOSIS — H539 Unspecified visual disturbance: Secondary | ICD-10-CM | POA: Insufficient documentation

## 2017-08-03 DIAGNOSIS — M479 Spondylosis, unspecified: Secondary | ICD-10-CM | POA: Insufficient documentation

## 2017-08-03 DIAGNOSIS — R200 Anesthesia of skin: Secondary | ICD-10-CM | POA: Diagnosis not present

## 2017-08-03 DIAGNOSIS — R202 Paresthesia of skin: Secondary | ICD-10-CM | POA: Diagnosis not present

## 2017-08-03 DIAGNOSIS — Z8249 Family history of ischemic heart disease and other diseases of the circulatory system: Secondary | ICD-10-CM | POA: Diagnosis not present

## 2017-08-03 DIAGNOSIS — Z7982 Long term (current) use of aspirin: Secondary | ICD-10-CM | POA: Diagnosis not present

## 2017-08-03 DIAGNOSIS — I7774 Dissection of vertebral artery: Secondary | ICD-10-CM | POA: Diagnosis not present

## 2017-08-03 DIAGNOSIS — K219 Gastro-esophageal reflux disease without esophagitis: Secondary | ICD-10-CM | POA: Diagnosis not present

## 2017-08-03 DIAGNOSIS — E1169 Type 2 diabetes mellitus with other specified complication: Secondary | ICD-10-CM

## 2017-08-03 DIAGNOSIS — R2990 Unspecified symptoms and signs involving the nervous system: Secondary | ICD-10-CM | POA: Diagnosis present

## 2017-08-03 DIAGNOSIS — R739 Hyperglycemia, unspecified: Secondary | ICD-10-CM

## 2017-08-03 DIAGNOSIS — I6789 Other cerebrovascular disease: Secondary | ICD-10-CM | POA: Diagnosis not present

## 2017-08-03 DIAGNOSIS — Z87820 Personal history of traumatic brain injury: Secondary | ICD-10-CM | POA: Diagnosis not present

## 2017-08-03 DIAGNOSIS — Z7902 Long term (current) use of antithrombotics/antiplatelets: Secondary | ICD-10-CM | POA: Diagnosis not present

## 2017-08-03 DIAGNOSIS — I6522 Occlusion and stenosis of left carotid artery: Secondary | ICD-10-CM | POA: Diagnosis not present

## 2017-08-03 DIAGNOSIS — N400 Enlarged prostate without lower urinary tract symptoms: Secondary | ICD-10-CM | POA: Diagnosis not present

## 2017-08-03 DIAGNOSIS — I639 Cerebral infarction, unspecified: Secondary | ICD-10-CM

## 2017-08-03 DIAGNOSIS — Z79899 Other long term (current) drug therapy: Secondary | ICD-10-CM | POA: Diagnosis not present

## 2017-08-03 HISTORY — DX: Cerebral infarction, unspecified: I63.9

## 2017-08-03 LAB — DIFFERENTIAL
BASOS ABS: 0 10*3/uL (ref 0.0–0.1)
BASOS PCT: 0 %
Eosinophils Absolute: 0.1 10*3/uL (ref 0.0–0.7)
Eosinophils Relative: 1 %
Lymphocytes Relative: 32 %
Lymphs Abs: 2.6 10*3/uL (ref 0.7–4.0)
Monocytes Absolute: 0.3 10*3/uL (ref 0.1–1.0)
Monocytes Relative: 4 %
NEUTROS ABS: 5.2 10*3/uL (ref 1.7–7.7)
Neutrophils Relative %: 63 %

## 2017-08-03 LAB — APTT: APTT: 26 s (ref 24–36)

## 2017-08-03 LAB — CBC
HEMATOCRIT: 49.2 % (ref 39.0–52.0)
Hemoglobin: 17 g/dL (ref 13.0–17.0)
MCH: 31 pg (ref 26.0–34.0)
MCHC: 34.6 g/dL (ref 30.0–36.0)
MCV: 89.6 fL (ref 78.0–100.0)
PLATELETS: 184 10*3/uL (ref 150–400)
RBC: 5.49 MIL/uL (ref 4.22–5.81)
RDW: 12.2 % (ref 11.5–15.5)
WBC: 8.2 10*3/uL (ref 4.0–10.5)

## 2017-08-03 LAB — COMPREHENSIVE METABOLIC PANEL
ALT: 20 U/L (ref 17–63)
AST: 18 U/L (ref 15–41)
Albumin: 3.9 g/dL (ref 3.5–5.0)
Alkaline Phosphatase: 116 U/L (ref 38–126)
Anion gap: 12 (ref 5–15)
BUN: 15 mg/dL (ref 6–20)
CHLORIDE: 101 mmol/L (ref 101–111)
CO2: 22 mmol/L (ref 22–32)
CREATININE: 0.96 mg/dL (ref 0.61–1.24)
Calcium: 9.3 mg/dL (ref 8.9–10.3)
GFR calc Af Amer: >60 mL/min (ref >60)
GFR calc non Af Amer: >60 mL/min (ref >60)
Glucose, Bld: 331 mg/dL — ABNORMAL HIGH (ref 65–99)
Potassium: 3.8 mmol/L (ref 3.5–5.1)
SODIUM: 135 mmol/L (ref 135–145)
Total Bilirubin: 0.9 mg/dL (ref 0.3–1.2)
Total Protein: 7.5 g/dL (ref 6.5–8.1)

## 2017-08-03 LAB — PROTIME-INR
INR: 0.97
PROTHROMBIN TIME: 12.8 s (ref 11.4–15.2)

## 2017-08-03 LAB — I-STAT CHEM 8, ED
BUN: 17 mg/dL (ref 6–20)
CREATININE: 0.8 mg/dL (ref 0.61–1.24)
Calcium, Ion: 1.15 mmol/L (ref 1.15–1.40)
Chloride: 101 mmol/L (ref 101–111)
Glucose, Bld: 332 mg/dL — ABNORMAL HIGH (ref 65–99)
HEMATOCRIT: 51 % (ref 39.0–52.0)
HEMOGLOBIN: 17.3 g/dL — AB (ref 13.0–17.0)
POTASSIUM: 3.7 mmol/L (ref 3.5–5.1)
Sodium: 138 mmol/L (ref 135–145)
TCO2: 25 mmol/L (ref 22–32)

## 2017-08-03 LAB — CBG MONITORING, ED: Glucose-Capillary: 265 mg/dL — ABNORMAL HIGH (ref 65–99)

## 2017-08-03 LAB — I-STAT TROPONIN, ED: Troponin i, poc: 0 ng/mL (ref 0.00–0.08)

## 2017-08-03 LAB — TROPONIN I: Troponin I: <0.03 ng/mL (ref <0.03)

## 2017-08-03 MED ORDER — ACETAMINOPHEN 160 MG/5ML PO SOLN: 650.0000 mg | ORAL | Status: DC | PRN

## 2017-08-03 MED ORDER — ASPIRIN 325 MG PO TABS
325.0000 mg | ORAL_TABLET | Freq: Every day | ORAL | Status: DC
  Administered 2017-08-04: 325 mg via ORAL
  Filled 2017-08-03: qty 1

## 2017-08-03 MED ORDER — KETOROLAC TROMETHAMINE 30 MG/ML IJ SOLN: 30.0000 mg | Freq: Once | INTRAMUSCULAR | Status: DC

## 2017-08-03 MED ORDER — ONDANSETRON HCL 4 MG/2ML IJ SOLN: 4.0000 mg | Freq: Three times a day (TID) | INTRAMUSCULAR | Status: DC | PRN

## 2017-08-03 MED ORDER — PROCHLORPERAZINE EDISYLATE 10 MG/2ML IJ SOLN: 10.0000 mg | Freq: Once | INTRAMUSCULAR | Status: DC

## 2017-08-03 MED ORDER — NITROGLYCERIN 0.4 MG SL SUBL
0.4000 mg | SUBLINGUAL_TABLET | SUBLINGUAL | Status: DC | PRN
Start: 2017-08-03 — End: 2017-08-04

## 2017-08-03 MED ORDER — ALBUTEROL SULFATE (2.5 MG/3ML) 0.083% IN NEBU: 2.5000 mg | INHALATION_SOLUTION | RESPIRATORY_TRACT | Status: DC | PRN

## 2017-08-03 MED ORDER — IOPAMIDOL (ISOVUE-370) INJECTION 76%: 150.0000 mL | Freq: Once | INTRAVENOUS | Status: DC | PRN

## 2017-08-03 MED ORDER — DIPHENHYDRAMINE HCL 50 MG/ML IJ SOLN
25.0000 mg | Freq: Once | INTRAMUSCULAR | Status: AC
  Administered 2017-08-03: 25 mg via INTRAVENOUS
  Filled 2017-08-03: qty 1

## 2017-08-03 MED ORDER — INSULIN ASPART 100 UNIT/ML ~~LOC~~ SOLN: 0.0000 [IU] | Freq: Three times a day (TID) | SUBCUTANEOUS | Status: DC

## 2017-08-03 MED ORDER — ACETAMINOPHEN 325 MG PO TABS: 650.0000 mg | ORAL_TABLET | ORAL | Status: DC | PRN

## 2017-08-03 MED ORDER — ATORVASTATIN CALCIUM 80 MG PO TABS: 80.0000 mg | ORAL_TABLET | Freq: Every day | ORAL | Status: DC

## 2017-08-03 MED ORDER — METHOCARBAMOL 500 MG PO TABS
500.0000 mg | ORAL_TABLET | Freq: Three times a day (TID) | ORAL | Status: DC | PRN
Start: 2017-08-03 — End: 2017-08-04

## 2017-08-03 MED ORDER — IOPAMIDOL (ISOVUE-370) INJECTION 76%
INTRAVENOUS | Status: AC
Start: 2017-08-03 — End: 2017-08-03
  Administered 2017-08-03: 125 mL
  Filled 2017-08-03: qty 50

## 2017-08-03 MED ORDER — SODIUM CHLORIDE 0.9 % IV SOLN
INTRAVENOUS | Status: DC
  Administered 2017-08-04: 03:00:00 via INTRAVENOUS

## 2017-08-03 MED ORDER — DIPHENHYDRAMINE HCL 50 MG/ML IJ SOLN: 12.5000 mg | Freq: Once | INTRAMUSCULAR | Status: DC

## 2017-08-03 MED ORDER — INSULIN ASPART 100 UNIT/ML ~~LOC~~ SOLN
0.0000 [IU] | Freq: Three times a day (TID) | SUBCUTANEOUS | Status: DC
  Administered 2017-08-04: 5 [IU] via SUBCUTANEOUS
  Filled 2017-08-03: qty 1

## 2017-08-03 MED ORDER — ZOLPIDEM TARTRATE 5 MG PO TABS: 5.0000 mg | ORAL_TABLET | Freq: Every evening | ORAL | Status: DC | PRN

## 2017-08-03 MED ORDER — ENOXAPARIN SODIUM 40 MG/0.4ML ~~LOC~~ SOLN
40.0000 mg | SUBCUTANEOUS | Status: DC
  Administered 2017-08-04: 40 mg via SUBCUTANEOUS
  Filled 2017-08-03: qty 0.4

## 2017-08-03 MED ORDER — CLOPIDOGREL BISULFATE 75 MG PO TABS
75.0000 mg | ORAL_TABLET | Freq: Every day | ORAL | Status: DC
Start: 2017-08-04 — End: 2017-08-04
  Administered 2017-08-04: 75 mg via ORAL
  Filled 2017-08-03 (×2): qty 1

## 2017-08-03 MED ORDER — INSULIN GLARGINE 100 UNIT/ML ~~LOC~~ SOLN
3.0000 [IU] | Freq: Every day | SUBCUTANEOUS | Status: DC
  Administered 2017-08-04 (×2): 3 [IU] via SUBCUTANEOUS
  Filled 2017-08-03 (×3): qty 0.03

## 2017-08-03 MED ORDER — CLOPIDOGREL BISULFATE 300 MG PO TABS
300.0000 mg | ORAL_TABLET | Freq: Once | ORAL | Status: AC
  Administered 2017-08-03: 300 mg via ORAL
  Filled 2017-08-03: qty 1

## 2017-08-03 MED ORDER — MORPHINE SULFATE (PF) 4 MG/ML IV SOLN: 2.0000 mg | INTRAVENOUS | Status: DC | PRN

## 2017-08-03 MED ORDER — ASPIRIN 81 MG PO CHEW: 324.0000 mg | CHEWABLE_TABLET | Freq: Once | ORAL | Status: DC

## 2017-08-03 MED ORDER — SODIUM CHLORIDE 0.9 % IV BOLUS
1000.0000 mL | Freq: Once | INTRAVENOUS | Status: AC
  Administered 2017-08-03: 1000 mL via INTRAVENOUS

## 2017-08-03 MED ORDER — IOPAMIDOL (ISOVUE-370) INJECTION 76%
INTRAVENOUS | Status: AC
  Filled 2017-08-03: qty 125

## 2017-08-03 MED ORDER — HYDRALAZINE HCL 20 MG/ML IJ SOLN: 5.0000 mg | INTRAMUSCULAR | Status: DC | PRN

## 2017-08-03 MED ORDER — ACETAMINOPHEN 650 MG RE SUPP: 650.0000 mg | RECTAL | Status: DC | PRN

## 2017-08-03 MED ORDER — STROKE: EARLY STAGES OF RECOVERY BOOK
Freq: Once | Status: DC
  Filled 2017-08-03: qty 1

## 2017-08-03 MED ORDER — PROCHLORPERAZINE EDISYLATE 10 MG/2ML IJ SOLN
10.0000 mg | Freq: Once | INTRAMUSCULAR | Status: AC
  Administered 2017-08-03: 10 mg via INTRAVENOUS
  Filled 2017-08-03: qty 2

## 2017-08-03 MED ORDER — SENNOSIDES-DOCUSATE SODIUM 8.6-50 MG PO TABS: 1.0000 | ORAL_TABLET | Freq: Every evening | ORAL | Status: DC | PRN

## 2017-08-03 MED ORDER — OMEGA-3-ACID ETHYL ESTERS 1 G PO CAPS
1.0000 g | ORAL_CAPSULE | Freq: Every day | ORAL | Status: DC
  Administered 2017-08-04: 1 g via ORAL
  Filled 2017-08-03 (×2): qty 1

## 2017-08-03 MED ORDER — PANTOPRAZOLE SODIUM 40 MG PO TBEC
40.0000 mg | DELAYED_RELEASE_TABLET | Freq: Every day | ORAL | Status: DC
  Administered 2017-08-04: 40 mg via ORAL
  Filled 2017-08-03: qty 1

## 2017-08-03 MED ORDER — ADULT MULTIVITAMIN W/MINERALS CH
2.0000 | ORAL_TABLET | Freq: Every day | ORAL | Status: DC
  Administered 2017-08-04: 2 via ORAL
  Filled 2017-08-03: qty 2

## 2017-08-03 MED ORDER — LORAZEPAM 2 MG/ML IJ SOLN
1.0000 mg | Freq: Once | INTRAMUSCULAR | Status: AC
  Administered 2017-08-03: 1 mg via INTRAVENOUS
  Filled 2017-08-03: qty 1

## 2017-08-03 NOTE — Telephone Encounter (Signed)
Contacted the [facility], Lonia FarberSarah Ellington RN will call and notify Dr Otilio CarpenKohuts office per Barbarann EhlersMichelle Rothrock.

## 2017-08-03 NOTE — ED Provider Notes (Signed)
[facility] EMERGENCY DEPARTMENT Provider Note   CSN: [id number] Arrival date & time: 08/03/17  1652     History   Chief Complaint Chief Complaint  Patient presents with  . Weakness  . Aphasia  . Chest Pain  . Numbness    HPI [name] is a 57 y.o. male.  HPI  57 year old male with a history of diabetes presents with left-sided weakness.  History is mostly taken from the wife.  The patient is mildly confused.  The patient minimizes most of his symptoms.  The wife states that around 5 PM yesterday he started complaining of some left arm numbness and pain.  This morning when she called him at around 930 he had some slurred speech.  She saw him around 4 PM or so this afternoon and he was having weakness in his left arm and was complaining of a headache.  He states the headache started closer to around 11 AM and has progressively worsened.  It is frontal and goes to his left side.  He is also having chest pain that he describes as pressure over his left chest at the same time.  No back pain.  While in the CT scanner prior to me seeing him he states that his left thigh was starting to be uncomfortable. When asked he feels like his left eye is blurry.  Past Medical History:  Diagnosis Date  . Allergy   . Diabetes mellitus without complication (HCC)   . Stroke (cerebrum) (HCC) 08/03/2017    Patient Active Problem List   Diagnosis Date Noted  . 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 Stroke-like symptoms 08/03/2017  . Chest pain   . Pain of left heel 12/21/2016  . Lumbar spondylosis 12/02/2016  . Dysphagia   . Diabetes mellitus without complication (HCC) 05/07/2015  . Bacteremia 05/07/2015  . Acute bronchitis 05/06/2015  . Headache   . SOB (shortness of breath)   . Varicose veins of leg with complications 03/18/2015  . Varicose veins of lower extremities with complications 09/09/2014  . Brain contusion (HCC) 09/21/2012  . Subarachnoid  hemorrhage (HCC) 09/21/2012  . Concussion with loss of consciousness 09/21/2012  . RHINOSINUSITIS, ACUTE 12/15/2007  . OCCUPATIONAL ASTHMA 07/16/2007  . SWELLING, MASS, OR LUMP IN CHEST 07/16/2007  . DYSPNEA ON EXERTION 05/18/2007  . HYPOGONADISM 05/10/2007  . ASTHMA 05/10/2007  . Esophageal reflux 05/10/2007  . KLINEFELTERS SYNDROME 05/10/2007  . COUGH 05/10/2007  . OTHER NONSPECIFIC FINDING EXAMINATION OF URINE 05/10/2007    Past Surgical History:  Procedure Laterality Date  . ESOPHAGOGASTRODUODENOSCOPY (EGD) WITH PROPOFOL N/A 05/09/2015   Procedure: ESOPHAGOGASTRODUODENOSCOPY (EGD) WITH PROPOFOL;  Surgeon: Iva Boop, MD;  Location: [facility];  Service: Endoscopy;  Laterality: N/A;  with esophageal dilation  . LASER ABLATION Left 02/03/15  . VARICOSE VEIN SURGERY          Home Medications    Prior to Admission medications   Medication Sig Start Date End Date Taking? Authorizing Provider  dapagliflozin propanediol (FARXIGA) 10 MG TABS tablet Take 10 mg by mouth daily.    [provider]  methocarbamol (ROBAXIN) 500 MG tablet Take 1 tablet (500 mg total) by mouth every 8 (eight) hours as needed for muscle spasms. 12/16/16   Andrena Mews, DO  Multiple Vitamin (MULTIVITAMIN WITH MINERALS) TABS tablet Take 2 tablets by mouth daily. Mega men 50 plus gnc    [provider]  Naproxen-Esomeprazole (VIMOVO) 500-20 MG TBEC Take 1 tablet  by mouth 2 (two) times daily. Patient not taking: Reported on 03/16/2017 12/16/16   Andrena Mews, DO  Omega-3 Fatty Acids (FISH OIL) 1000 MG CAPS Take by mouth.    [provider]  OVER THE COUNTER MEDICATION Take 2 capsules by mouth daily. apexatropin supplement    [provider]  OVER THE COUNTER MEDICATION Take 1 tablet by mouth daily as needed (sex drive). Over the counter sex drive medication    [provider]  pantoprazole (PROTONIX) 40 MG tablet Take 1 tablet (40 mg total) by mouth daily at  6 (six) AM. 05/09/15   [name], MD  Testosterone (ANDROGEL) 20.25 MG/1.25GM (1.62%) GEL Place 2 application onto the skin daily. 2 pumps on each shoulder daily    [provider]  traMADol (ULTRAM) 50 MG tablet Take 1 tablet (50 mg total) by mouth every 8 (eight) hours as needed for moderate pain. Do not drive while on this medication Patient not taking: Reported on 03/16/2017 12/01/16   Andrena Mews, DO    Family History Family History  Problem Relation Age of Onset  . Parkinson's disease Father   . Heart attack Maternal Grandmother     Social History Social History   Tobacco Use  . Smoking status: Never Smoker  . Smokeless tobacco: Never Used  Substance Use Topics  . Alcohol use: No    Alcohol/week: 0.0 oz  . Drug use: No     Allergies   Latex   Review of Systems Review of Systems  Eyes: Positive for visual disturbance.  Cardiovascular: Positive for chest pain.  Gastrointestinal: Positive for vomiting (earlier in the morning).  Musculoskeletal: Negative for back pain.  Neurological: Positive for weakness, numbness and headaches.  All other systems reviewed and are negative.    Physical Exam Updated Vital Signs BP (!) 107/59   Pulse 75   Temp 98 F (36.7 C) (Oral)   Resp 15   Ht 6\' 4"  (1.93 m)   Wt 102.1 kg (225 lb)   SpO2 96%   BMI 27.39 kg/m   Physical Exam  Constitutional: He appears well-developed and well-nourished.  Non-toxic appearance. He does not appear ill. No distress.  HENT:  Head: Normocephalic and atraumatic.  Right Ear: External ear normal.  Left Ear: External ear normal.  Nose: Nose normal.  Eyes: Pupils are equal, round, and reactive to light. EOM are normal. Right eye exhibits no discharge. Left eye exhibits no discharge.  Left sided visual field cut  Neck: Neck supple.  Cardiovascular: Normal rate, regular rhythm and normal heart sounds.  Pulses:      Radial pulses are 2+ on the right side, and 2+ on the left side.        Dorsalis pedis pulses are 2+ on the left side.  Pulmonary/Chest: Effort normal and breath sounds normal.  Abdominal: Soft. There is no tenderness.  Musculoskeletal: He exhibits no edema.  Neurological: He is alert. He is disoriented.  Awake, alert but appears confused. Knows he's in a hospital in [place]. Able to tell me it's April after a lot of thought but thinks it's 2018. CN 3-12 grossly intact. 5/5 strength RUE, RLE. 4/5 strength LUE, LLE. Decreased subjective sensation to LUE, LLE.   Skin: Skin is warm and dry.  Nursing note and vitals reviewed.    ED Treatments / Results  Labs (all labs ordered are listed, but only abnormal results are displayed) Labs Reviewed  COMPREHENSIVE METABOLIC PANEL - Abnormal; Notable for the  following components:      Result Value   Glucose, Bld 331 (*)    All other components within normal limits  CBG MONITORING, ED - Abnormal; Notable for the following components:   Glucose-Capillary 265 (*)    All other components within normal limits  I-STAT CHEM 8, ED - Abnormal; Notable for the following components:   Glucose, Bld 332 (*)    Hemoglobin 17.3 (*)    All other components within normal limits  PROTIME-INR  APTT  CBC  DIFFERENTIAL  TROPONIN I  RAPID URINE DRUG SCREEN, HOSP PERFORMED  TROPONIN I  TROPONIN I  HIV ANTIBODY (ROUTINE TESTING)  HEMOGLOBIN A1C  LIPID PANEL  I-STAT TROPONIN, ED    EKG EKG Interpretation  Date/Time:  Wednesday August 03 2017 16:56:50 EDT Ventricular Rate:  110 PR Interval:  126 QRS Duration: 96 QT Interval:  346 QTC Calculation: 468 R Axis:   -89 Text Interpretation:  Sinus tachycardia Incomplete right bundle branch block Left anterior fascicular block Abnormal ECG rate faster compared to Jan 2017 Confirmed by Pricilla Loveless [phone]) on 08/03/2017 5:48:42 PM   Radiology Ct Angio Head W Or Wo Contrast  Result Date: 08/03/2017 CLINICAL DATA:  Chest pain last night, slurred speech this morning.  LEFT extremity weakness, numbness and tingling. History of diabetes, contusion and subarachnoid hemorrhage. EXAM: CT ANGIOGRAPHY HEAD AND NECK CT PERFUSION BRAIN TECHNIQUE: Multidetector CT imaging of the head and neck was performed using the standard protocol during bolus administration of intravenous contrast. Multiplanar CT image reconstructions and MIPs were obtained to evaluate the vascular anatomy. Carotid stenosis measurements (when applicable) are obtained utilizing NASCET criteria, using the distal internal carotid diameter as the denominator. Multiphase CT imaging of the brain was performed following IV bolus contrast injection. Subsequent parametric perfusion maps were calculated using RAPID software. CONTRAST:  ISOVUE-370 IOPAMIDOL (ISOVUE-370) INJECTION 76% COMPARISON:  CT HEAD August 03, 2017 at 1727 hours FINDINGS: CTA NECK FINDINGS: AORTIC ARCH: Normal appearance of the thoracic arch, normal branch pattern. The origins of the innominate, left Common carotid artery and subclavian artery are patent. Moderate intimal thickening LEFT subclavian artery origin resulting in mild stenosis. RIGHT CAROTID SYSTEM: Common carotid artery is widely patent, coursing in a straight line fashion. Normal appearance of the carotid bifurcation without hemodynamically significant stenosis by NASCET criteria. Normal appearance of the internal carotid artery. LEFT CAROTID SYSTEM: Common carotid artery is widely patent, coursing in a straight line fashion. Minimal intimal thickening carotid bifurcation without hemodynamically significant stenosis by NASCET criteria. Normal appearance of the internal carotid artery. VERTEBRAL ARTERIES:Codominant patent vertebral arteries. Focal severe stenosis distal LEFT V1 segment. Mild luminal irregularity LEFT V2 and V3 segments. SKELETON: No acute osseous process though bone windows have not been submitted. Moderate to severe LEFT C4-5 neural foraminal narrowing. Moderate LEFT C6-7  neural foraminal narrowing. OTHER NECK: Soft tissues of the neck are nonacute though, not tailored for evaluation. UPPER CHEST: Included lung apices are clear. No superior mediastinal lymphadenopathy. CTA HEAD FINDINGS: ANTERIOR CIRCULATION: Patent cervical internal carotid arteries, petrous, cavernous and supra clinoid internal carotid arteries. Patent anterior communicating artery. Incompletely fenestrated RIGHT A1 segment. Patent anterior and middle cerebral arteries. No large vessel occlusion, significant stenosis, contrast extravasation or aneurysm. POSTERIOR CIRCULATION: Patent vertebral arteries, vertebrobasilar junction and basilar artery, as well as main branch vessels. Slight luminal irregularity basilar artery most compatible with atherosclerosis patent posterior cerebral arteries. Robust RIGHT posterior communicating artery present. No large vessel occlusion, significant stenosis, contrast extravasation or aneurysm.  VENOUS SINUSES: Major dural venous sinuses are patent though not tailored for evaluation on this angiographic examination. ANATOMIC VARIANTS: None. DELAYED PHASE: Not performed. MIP images reviewed. CT Brain Perfusion Findings: CBF (<30%) Volume: 0mL Perfusion (Tmax>6.0s) volume: 0mL Mismatch Volume: 0mL Infarction Location:None IMPRESSION: CTA NECK: 1. Severe stenosis LEFT V1 segment consistent with dissection without pseudoaneurysm or occlusion. 2. No carotid artery stenosis. 3. Moderate to severe LEFT C3-4 neural foraminal narrowing. CTA HEAD: 1. No emergent large vessel occlusion or flow limiting stenosis. CT PERFUSION: 1. Negative. Critical Value/emergent results were called by telephone at the time of interpretation on 08/03/2017 at 8:13 pm to Dr. Pricilla LovelessSCOTT Kinan Safley , who verbally acknowledged these results. Critical Value/emergent results were called by telephone at the time of interpretation on 08/03/2017 at 8:15 pm to Dr. Amada JupiterKirkpatrick, Neurology, who verbally acknowledged these results.  Electronically Signed   By: Awilda Metroourtnay  Bloomer M.D.   On: 08/03/2017 20:19   Ct Head Wo Contrast  Result Date: 08/03/2017 CLINICAL DATA:  Left-sided numbness, slurred speech, chest pain and severe headache since yesterday at 4 p.m. EXAM: CT HEAD WITHOUT CONTRAST TECHNIQUE: Contiguous axial images were obtained from the base of the skull through the vertex without intravenous contrast. COMPARISON:  09/22/2012 FINDINGS: BRAIN: The ventricles and sulci are normal. No intraparenchymal hemorrhage, mass effect nor midline shift. No acute large vascular territory infarcts. Grey-white matter distinction is maintained. The basal ganglia are unremarkable. No abnormal extra-axial fluid collections. Basal cisterns are not effaced and midline. The brainstem and cerebellar hemispheres are without acute abnormalities. VASCULAR: No hyperdense vessel sign. Mild atherosclerosis of both carotid siphons. No unexpected calcifications. SKULL/SOFT TISSUES: No skull fracture. No significant soft tissue swelling. ORBITS/SINUSES: The included ocular globes and orbital contents are normal.The mastoid air cells are clear. The included paranasal sinuses are well-aerated. OTHER: None. IMPRESSION: No acute intracranial abnormality. Electronically Signed   By: Tollie Ethavid  Kwon M.D.   On: 08/03/2017 17:54   Ct Angio Neck W Or Wo Contrast  Result Date: 08/03/2017 CLINICAL DATA:  Chest pain last night, slurred speech this morning. LEFT extremity weakness, numbness and tingling. History of diabetes, contusion and subarachnoid hemorrhage. EXAM: CT ANGIOGRAPHY HEAD AND NECK CT PERFUSION BRAIN TECHNIQUE: Multidetector CT imaging of the head and neck was performed using the standard protocol during bolus administration of intravenous contrast. Multiplanar CT image reconstructions and MIPs were obtained to evaluate the vascular anatomy. Carotid stenosis measurements (when applicable) are obtained utilizing NASCET criteria, using the distal internal  carotid diameter as the denominator. Multiphase CT imaging of the brain was performed following IV bolus contrast injection. Subsequent parametric perfusion maps were calculated using RAPID software. CONTRAST:  125mL ISOVUE-370 IOPAMIDOL (ISOVUE-370) INJECTION 76% COMPARISON:  CT HEAD August 03, 2017 at 1727 hours FINDINGS: CTA NECK FINDINGS: AORTIC ARCH: Normal appearance of the thoracic arch, normal branch pattern. The origins of the innominate, left Common carotid artery and subclavian artery are patent. Moderate intimal thickening LEFT subclavian artery origin resulting in mild stenosis. RIGHT CAROTID SYSTEM: Common carotid artery is widely patent, coursing in a straight line fashion. Normal appearance of the carotid bifurcation without hemodynamically significant stenosis by NASCET criteria. Normal appearance of the internal carotid artery. LEFT CAROTID SYSTEM: Common carotid artery is widely patent, coursing in a straight line fashion. Minimal intimal thickening carotid bifurcation without hemodynamically significant stenosis by NASCET criteria. Normal appearance of the internal carotid artery. VERTEBRAL ARTERIES:Codominant patent vertebral arteries. Focal severe stenosis distal LEFT V1 segment. Mild luminal irregularity LEFT V2 and V3 segments. SKELETON:  No acute osseous process though bone windows have not been submitted. Moderate to severe LEFT C4-5 neural foraminal narrowing. Moderate LEFT C6-7 neural foraminal narrowing. OTHER NECK: Soft tissues of the neck are nonacute though, not tailored for evaluation. UPPER CHEST: Included lung apices are clear. No superior mediastinal lymphadenopathy. CTA HEAD FINDINGS: ANTERIOR CIRCULATION: Patent cervical internal carotid arteries, petrous, cavernous and supra clinoid internal carotid arteries. Patent anterior communicating artery. Incompletely fenestrated RIGHT A1 segment. Patent anterior and middle cerebral arteries. No large vessel occlusion, significant  stenosis, contrast extravasation or aneurysm. POSTERIOR CIRCULATION: Patent vertebral arteries, vertebrobasilar junction and basilar artery, as well as main branch vessels. Slight luminal irregularity basilar artery most compatible with atherosclerosis patent posterior cerebral arteries. Robust RIGHT posterior communicating artery present. No large vessel occlusion, significant stenosis, contrast extravasation or aneurysm. VENOUS SINUSES: Major dural venous sinuses are patent though not tailored for evaluation on this angiographic examination. ANATOMIC VARIANTS: None. DELAYED PHASE: Not performed. MIP images reviewed. CT Brain Perfusion Findings: CBF (<30%) Volume: 0mL Perfusion (Tmax>6.0s) volume: 0mL Mismatch Volume: 0mL Infarction Location:None IMPRESSION: CTA NECK: 1. Severe stenosis LEFT V1 segment consistent with dissection without pseudoaneurysm or occlusion. 2. No carotid artery stenosis. 3. Moderate to severe LEFT C3-4 neural foraminal narrowing. CTA HEAD: 1. No emergent large vessel occlusion or flow limiting stenosis. CT PERFUSION: 1. Negative. Critical Value/emergent results were called by telephone at the time of interpretation on 08/03/2017 at 8:13 pm to Dr. Pricilla Loveless , who verbally acknowledged these results. Critical Value/emergent results were called by telephone at the time of interpretation on 08/03/2017 at 8:15 pm to Dr. Amada Jupiter, Neurology, who verbally acknowledged these results. Electronically Signed   By: Awilda Metro M.D.   On: 08/03/2017 20:19   Ct Cerebral Perfusion W Contrast  Result Date: 08/03/2017 CLINICAL DATA:  Chest pain last night, slurred speech this morning. LEFT extremity weakness, numbness and tingling. History of diabetes, contusion and subarachnoid hemorrhage. EXAM: CT ANGIOGRAPHY HEAD AND NECK CT PERFUSION BRAIN TECHNIQUE: Multidetector CT imaging of the head and neck was performed using the standard protocol during bolus administration of intravenous  contrast. Multiplanar CT image reconstructions and MIPs were obtained to evaluate the vascular anatomy. Carotid stenosis measurements (when applicable) are obtained utilizing NASCET criteria, using the distal internal carotid diameter as the denominator. Multiphase CT imaging of the brain was performed following IV bolus contrast injection. Subsequent parametric perfusion maps were calculated using RAPID software. CONTRAST:  ISOVUE-370 IOPAMIDOL (ISOVUE-370) INJECTION 76% COMPARISON:  CT HEAD August 03, 2017 at 1727 hours FINDINGS: CTA NECK FINDINGS: AORTIC ARCH: Normal appearance of the thoracic arch, normal branch pattern. The origins of the innominate, left Common carotid artery and subclavian artery are patent. Moderate intimal thickening LEFT subclavian artery origin resulting in mild stenosis. RIGHT CAROTID SYSTEM: Common carotid artery is widely patent, coursing in a straight line fashion. Normal appearance of the carotid bifurcation without hemodynamically significant stenosis by NASCET criteria. Normal appearance of the internal carotid artery. LEFT CAROTID SYSTEM: Common carotid artery is widely patent, coursing in a straight line fashion. Minimal intimal thickening carotid bifurcation without hemodynamically significant stenosis by NASCET criteria. Normal appearance of the internal carotid artery. VERTEBRAL ARTERIES:Codominant patent vertebral arteries. Focal severe stenosis distal LEFT V1 segment. Mild luminal irregularity LEFT V2 and V3 segments. SKELETON: No acute osseous process though bone windows have not been submitted. Moderate to severe LEFT C4-5 neural foraminal narrowing. Moderate LEFT C6-7 neural foraminal narrowing. OTHER NECK: Soft tissues of the neck are nonacute though, not  tailored for evaluation. UPPER CHEST: Included lung apices are clear. No superior mediastinal lymphadenopathy. CTA HEAD FINDINGS: ANTERIOR CIRCULATION: Patent cervical internal carotid arteries, petrous, cavernous  and supra clinoid internal carotid arteries. Patent anterior communicating artery. Incompletely fenestrated RIGHT A1 segment. Patent anterior and middle cerebral arteries. No large vessel occlusion, significant stenosis, contrast extravasation or aneurysm. POSTERIOR CIRCULATION: Patent vertebral arteries, vertebrobasilar junction and basilar artery, as well as main branch vessels. Slight luminal irregularity basilar artery most compatible with atherosclerosis patent posterior cerebral arteries. Robust RIGHT posterior communicating artery present. No large vessel occlusion, significant stenosis, contrast extravasation or aneurysm. VENOUS SINUSES: Major dural venous sinuses are patent though not tailored for evaluation on this angiographic examination. ANATOMIC VARIANTS: None. DELAYED PHASE: Not performed. MIP images reviewed. CT Brain Perfusion Findings: CBF (<30%) Volume: 0mL Perfusion (Tmax>6.0s) volume: 0mL Mismatch Volume: 0mL Infarction Location:None IMPRESSION: CTA NECK: 1. Severe stenosis LEFT V1 segment consistent with dissection without pseudoaneurysm or occlusion. 2. No carotid artery stenosis. 3. Moderate to severe LEFT C3-4 neural foraminal narrowing. CTA HEAD: 1. No emergent large vessel occlusion or flow limiting stenosis. CT PERFUSION: 1. Negative. Critical Value/emergent results were called by telephone at the time of interpretation on 08/03/2017 at 8:13 pm to Dr. Pricilla Loveless , who verbally acknowledged these results. Critical Value/emergent results were called by telephone at the time of interpretation on 08/03/2017 at 8:15 pm to Dr. Amada Jupiter, Neurology, who verbally acknowledged these results. Electronically Signed   By: Awilda Metro M.D.   On: 08/03/2017 20:19   Dg Chest Port 1 View  Result Date: 08/03/2017 CLINICAL DATA:  Stroke symptoms.  Chest pain. EXAM: PORTABLE CHEST 1 VIEW COMPARISON:  05/06/2015; 09/21/2012 FINDINGS: Grossly unchanged cardiac silhouette and mediastinal contours  given slightly reduced lung volumes. No focal parenchymal opacities. No pleural effusion or pneumothorax. No evidence of edema. No acute osseus abnormalities. IMPRESSION: No acute cardiopulmonary disease. Electronically Signed   By: Simonne Come M.D.   On: 08/03/2017 19:01   Ct Angio Chest/abd/pel For Dissection W And/or Wo Contrast  Result Date: 08/03/2017 CLINICAL DATA:  Acute chest and back pain, concern for dissection EXAM: CT ANGIOGRAPHY CHEST, ABDOMEN AND PELVIS TECHNIQUE: Multidetector CT imaging through the chest, abdomen and pelvis was performed using the standard protocol during bolus administration of intravenous contrast. Multiplanar reconstructed images and MIPs were obtained and reviewed to evaluate the vascular anatomy. CONTRAST:  ISOVUE-370 IOPAMIDOL (ISOVUE-370) INJECTION 76% COMPARISON:  None. FINDINGS: CTA CHEST FINDINGS Cardiovascular: Intact thoracic aorta. Negative for aneurysm, dissection, or intramural hematoma. No mediastinal hemorrhage or hematoma. Patent 3 vessel arch anatomy. Central pulmonary arteries are patent. Air within the pulmonary outflow tract related to peripheral IV access. Normal heart size. No pericardial or pleural effusion. Mediastinum/Nodes: No enlarged mediastinal, hilar, or axillary lymph nodes. Thyroid gland, trachea, and esophagus demonstrate no significant findings. Lungs/Pleura: Minor scattered basilar atelectasis. No focal pneumonia, collapse or consolidation. No focal airspace process or hemorrhage. No pleural abnormality, effusion or pneumothorax. Trachea central airways are patent. Musculoskeletal: Thoracic spondylosis noted. No acute osseous finding or fracture. Intact sternum manubrium. Review of the MIP images confirms the above findings. CTA ABDOMEN AND PELVIS FINDINGS VASCULAR Aorta: Intact abdominal aorta. Negative for aneurysm or dissection. No retroperitoneal hemorrhage or hematoma. Negative for rupture. Minor atherosclerotic change. Celiac:  Widely patent including its branches SMA: Widely patent including its branches Renals: Widely patent IMA: Widely patent including its branches Inflow: Pelvic iliac vessels are widely patent. No inflow disease or occlusion. Negative for dissection or acute  vascular process Veins: Dedicated venous imaging not performed Review of the MIP images confirms the above findings. NON-VASCULAR Hepatobiliary: No focal liver abnormality is seen. No gallstones, gallbladder wall thickening, or biliary dilatation. Pancreas: Unremarkable. No pancreatic ductal dilatation or surrounding inflammatory changes. Spleen: Normal in size without focal abnormality. Adrenals/Urinary Tract: Adrenal glands are unremarkable. Kidneys are normal, without renal calculi, focal lesion, or hydronephrosis. Bladder is unremarkable. Stomach/Bowel: Stomach is within normal limits. Appendix appears normal. No evidence of bowel wall thickening, distention, or inflammatory changes. Lymphatic: No adenopathy Reproductive: Mild prostate enlargement. Seminal vesicles are symmetric. Other: No abdominal wall hernia or abnormality. No abdominopelvic ascites. Musculoskeletal: Degenerative changes of the spine and facet joints. No acute osseous finding. Review of the MIP images confirms the above findings. IMPRESSION: Negative for acute aortic dissection.  No acute vascular finding. No acute intrathoracic finding No acute intra-abdominal or pelvic finding Electronically Signed   By: Judie Petit.  Shick M.D.   On: 08/03/2017 20:20    Procedures Procedures (including critical care time)  Medications Ordered in ED Medications  iopamidol (ISOVUE-370) 76 % injection (has no administration in time range)  methocarbamol (ROBAXIN) tablet 500 mg (has no administration in time range)  multivitamin with minerals tablet 2 tablet (has no administration in time range)  omega-3 acid ethyl esters (LOVAZA) capsule 1 g (has no administration in time range)  pantoprazole (PROTONIX) EC  tablet 40 mg (has no administration in time range)  atorvastatin (LIPITOR) tablet 80 mg (has no administration in time range)  nitroGLYCERIN (NITROSTAT) SL tablet 0.4 mg (has no administration in time range)  morphine 4 MG/ML injection 2 mg (has no administration in time range)  albuterol (PROVENTIL) (2.5 MG/3ML) 0.083% nebulizer solution 2.5 mg (has no administration in time range)   stroke: mapping our early stages of recovery book (has no administration in time range)  0.9 %  sodium chloride infusion (has no administration in time range)  acetaminophen (TYLENOL) tablet 650 mg (has no administration in time range)    Or  acetaminophen (TYLENOL) solution 650 mg (has no administration in time range)    Or  acetaminophen (TYLENOL) suppository 650 mg (has no administration in time range)  senna-docusate (Senokot-S) tablet 1 tablet (has no administration in time range)  enoxaparin (LOVENOX) injection 40 mg (has no administration in time range)  insulin glargine (LANTUS) injection 3 Units (has no administration in time range)  insulin aspart (novoLOG) injection 0-9 Units (has no administration in time range)  clopidogrel (PLAVIX) tablet 75 mg (has no administration in time range)  aspirin tablet 325 mg (has no administration in time range)  ondansetron (ZOFRAN) injection 4 mg (has no administration in time range)  hydrALAZINE (APRESOLINE) injection 5 mg (has no administration in time range)  zolpidem (AMBIEN) tablet 5 mg (has no administration in time range)  sodium chloride 0.9 % bolus 1,000 mL (0 mLs Intravenous Stopped 08/03/17 1941)  iopamidol (ISOVUE-370) 76 % injection (125 mLs  Contrast Given 08/03/17 1909)  prochlorperazine (COMPAZINE) injection 10 mg (10 mg Intravenous Given 08/03/17 2007)  diphenhydrAMINE (BENADRYL) injection 25 mg (25 mg Intravenous Given 08/03/17 2007)  clopidogrel (PLAVIX) tablet 300 mg (300 mg Oral Given 08/03/17 2047)  LORazepam (ATIVAN) injection 1 mg (1 mg  Intravenous Given 08/03/17 2220)     Initial Impression / Assessment and Plan / ED Course  I have reviewed the triage vital signs and the nursing notes.  Pertinent labs & imaging results that were available during my care of the patient were  reviewed by me and considered in my medical decision making (see chart for details).  Clinical Course as of Aug 04 2319  Wed Aug 03, 2017  1832 Patient has a left sided visual field cut.  This is in addition to slurred speech and left-sided weakness.  While he does have chest pain, I am more concerned for a CNS process.  This would be consistent with a large vessel occlusion but the wife states that technically his symptoms started around 5 PM and have been progressive.  After discussing with neuro, Dr. Wilford Corner, he agrees with getting CT angios and perfusion but this would not be a code stroke because it is over 24 hours.  Neurology will consult.   [SG]  1844 Given Chest pain, Dr. Wilford Corner also asks for r/o dissection while he's getting other scans.   [SG]    Clinical Course User Index [SG] Pricilla Loveless, MD    Patient initially worked up for strokelike symptoms.  CT angiography of head and neck shows no acute large vessel occlusion but does show a vertebral artery dissection.  I believe this is on the left side which would not really correlate with his strokelike symptoms.  Given his 8 out of 10 chest pain, CT angiography for dissection was obtained and does not show an obvious aortic dissection.  Prior to CT findings, neurology recommended headache cocktail to help with what is likely a comp located migraine.  Given these findings, Dr. Amada Jupiter of neurology recommends full dose aspirin and 300 mg load of Plavix.  He has already had the aspirin by EMS just a couple hours prior.  He will need to be admitted for MRI and further workup.  Final Clinical Impressions(s) / ED Diagnoses   Final diagnoses:  Vertebral artery dissection [facility])  Hyperglycemia     ED Discharge Orders    None       Pricilla Loveless, MD 08/03/17 2322

## 2017-08-03 NOTE — ED Notes (Signed)
Patient transported to CT, neurology at CT

## 2017-08-03 NOTE — ED Notes (Signed)
CT called, pt will be next for CT.

## 2017-08-03 NOTE — ED Triage Notes (Addendum)
Pt presents with [facility] for stroke like symptoms ( L arm/leg weakness, numbness, tingling) and CP that began last night before bed; pt went to bed, woke up and also developed slurred speech (09:30a); pt continued to go to work (mowing) and co-workers reported pt had been complaining of his L arm all day; wife also reporting difficulty with ambulating; EMS reports giving 324mg  ASA and 2 SL NTG tabs enroute with improvement in CP, 18g LAC present

## 2017-08-03 NOTE — ED Notes (Signed)
Patient transported to MRI 

## 2017-08-03 NOTE — H&P (Signed)
History and Physical    [name] ZOX:[id number] DOB: February 09, 1961 DOA: 08/03/2017  Referring MD/NP/PA:   PCP: Ricky Needle, MD   Patient coming from:  The patient is coming from home.  At baseline, pt is independent for most of ADL.   Chief Complaint: stroke-like symptoms, HA and chest pain  HPI: [name] is a 57 y.o. male with medical history significant of diabetes mellitus, asthma, GERD, SDH, varicose vein in leg, who presents with stroke-like symptoms and chest pain.  Pt was LKN before 5:00 PM on 4/16. He developed left-sided numbness and weakness, involving left arm, leg and the left face in the evening. He went to work this AM, and then developed headache with photophobia. He also had nausea and vomited several times. No diarrhea or abdominal pain. He states that he still has mild numbness and tingling in left arm and the leg, and the mild weakness in left leg and arm. Per his wife, patient had slurred speech and blurry vision in left eye. Pt states that he has chest pain, which is located in the substernal area, 7 out of 10 in severity, radiating to the left shoulder, pressure-like, nonradiating. No shortness rest. Denies cough, fever or chills. No symptoms of UTI.  ED Course: pt was found to have negative troponin, WBC 8.2, INR 0.97, electrolytes renal function okay, temperature normal, oxygen saturation 92% on room air. Chest x-ray negative. CT head is negative. CT angiogram for chest/abdomen and pelvis negative. CT angiogram of head and neck did not show LVO, but showed left V1 dissection. CT of head perfusion study is negative. Pt is admitted to telemetry bed as inpatient. Neurology, Dr. Amada Jupiter was consulted.  Review of Systems:   General: no fevers, chills, no body weight gain, has fatigue.  HEENT: no blurry vision, hearing changes or sore throat Respiratory: no dyspnea, coughing, wheezing CV: no chest pain, no palpitations GI: has nausea, vomiting, no  abdominal pain, diarrhea, constipation GU: no dysuria, burning on urination, increased urinary frequency, hematuria  Ext: no leg edema Neuro: has left-sided numbness and tingling, and weakness. Left eye blurry vision and headache. Skin: no rash, no skin tear. Has varicose vein in leg MSK: No muscle spasm, no deformity, no limitation of range of movement in spin Heme: No easy bruising.  Travel history: No recent long distant travel.  Allergy:  Allergies  Allergen Reactions  . Latex Rash    Past Medical History:  Diagnosis Date  . Allergy   . Diabetes mellitus without complication (HCC)   . Stroke (cerebrum) (HCC) 08/03/2017    Past Surgical History:  Procedure Laterality Date  . ESOPHAGOGASTRODUODENOSCOPY (EGD) WITH PROPOFOL N/A 05/09/2015   Procedure: ESOPHAGOGASTRODUODENOSCOPY (EGD) WITH PROPOFOL;  Surgeon: Iva Boop, MD;  Location: [facility];  Service: Endoscopy;  Laterality: N/A;  with esophageal dilation  . LASER ABLATION Left 02/03/15  . VARICOSE VEIN SURGERY      Social History:  reports that he has never smoked. He has never used smokeless tobacco. He reports that he does not drink alcohol or use drugs.  Family History:  Family History  Problem Relation Age of Onset  . Parkinson's disease Father   . Heart attack Maternal Grandmother      Prior to Admission medications   Medication Sig Start Date End Date Taking? Authorizing Provider  dapagliflozin propanediol (FARXIGA) 10 MG TABS tablet Take 10 mg by mouth daily.    [provider]  methocarbamol (ROBAXIN) 500 MG tablet Take 1  tablet (500 mg total) by mouth every 8 (eight) hours as needed for muscle spasms. 12/16/16   Andrena Mews, DO  Multiple Vitamin (MULTIVITAMIN WITH MINERALS) TABS tablet Take 2 tablets by mouth daily. Mega men 50 plus gnc    [provider]  Naproxen-Esomeprazole (VIMOVO) 500-20 MG TBEC Take 1 tablet by mouth 2 (two) times daily. Patient not taking: Reported on  03/16/2017 12/16/16   Andrena Mews, DO  Omega-3 Fatty Acids (FISH OIL) 1000 MG CAPS Take by mouth.    [provider]  OVER THE COUNTER MEDICATION Take 2 capsules by mouth daily. apexatropin supplement    [provider]  OVER THE COUNTER MEDICATION Take 1 tablet by mouth daily as needed (sex drive). Over the counter sex drive medication    [provider]  pantoprazole (PROTONIX) 40 MG tablet Take 1 tablet (40 mg total) by mouth daily at 6 (six) AM. 05/09/15   [name], MD  Testosterone (ANDROGEL) 20.25 MG/1.25GM (1.62%) GEL Place 2 application onto the skin daily. 2 pumps on each shoulder daily    [provider]  traMADol (ULTRAM) 50 MG tablet Take 1 tablet (50 mg total) by mouth every 8 (eight) hours as needed for moderate pain. Do not drive while on this medication Patient not taking: Reported on 03/16/2017 12/01/16   Andrena Mews, DO    Physical Exam: Vitals:   08/03/17 1945 08/03/17 2000 08/03/17 2015 08/03/17 2030  BP: (!) 149/99 (!) 150/84 123/87 (!) 148/95  Pulse: 81 95 82 90  Resp: 13 17 15  (!) 24  Temp:      TempSrc:      SpO2: 98% 97% 95% 95%  Weight:      Height:       General: Not in acute distress HEENT:       Eyes: PERRL, EOMI, no scleral icterus.       ENT: No discharge from the ears and nose, no pharynx injection, no tonsillar enlargement.        Neck: No JVD, no bruit, no mass felt. Heme: No neck lymph node enlargement. Cardiac: S1/S2, RRR, No murmurs, No gallops or rubs. Respiratory:  rales, wheezing, rhonchi or rubs. GI: Soft, nondistended, nontender, no rebound pain, no organomegaly, BS present. GU: No hematuria Ext: No pitting leg edema bilaterally. 2+DP/PT pulse bilaterally. Musculoskeletal: No joint deformities, No joint redness or warmth, no limitation of ROM in spin. Skin: No rashes.  Neuro: Alert, oriented X3, cranial nerves II-XII grossly intact, Muscle strength 5/5 in all extremities, sensation to light  touch intact. Brachial reflex 2+ bilaterally. Negative Babinski's sign. Psych: Patient is not psychotic, no suicidal or hemocidal ideation.  Labs on Admission: I have personally reviewed following labs and imaging studies  CBC: Recent Labs  Lab 08/03/17 1714 08/03/17 1756  WBC 8.2  --   NEUTROABS 5.2  --   HGB 17.0 17.3*  HCT 49.2 51.0  MCV 89.6  --   PLT 184  --    Basic Metabolic Panel: Recent Labs  Lab 08/03/17 1714 08/03/17 1756  NA 135 138  K 3.8 3.7  CL 101 101  CO2 22  --   GLUCOSE 331* 332*  BUN 15 17  CREATININE 0.96 0.80  CALCIUM 9.3  --    GFR: Estimated Creatinine Clearance: 123.6 mL/min (by C-G formula based on SCr of 0.8 mg/dL). Liver Function Tests: Recent Labs  Lab 08/03/17 1714  AST 18  ALT 20  ALKPHOS 116  BILITOT  0.9  PROT 7.5  ALBUMIN 3.9   No results for input(s): LIPASE, AMYLASE in the last 168 hours. No results for input(s): AMMONIA in the last 168 hours. Coagulation Profile: Recent Labs  Lab 08/03/17 1714  INR 0.97   Cardiac Enzymes: No results for input(s): CKTOTAL, CKMB, CKMBINDEX, TROPONINI in the last 168 hours. BNP (last 3 results) No results for input(s): PROBNP in the last 8760 hours. HbA1C: No results for input(s): HGBA1C in the last 72 hours. CBG: Recent Labs  Lab 08/03/17 1851  GLUCAP 265*   Lipid Profile: No results for input(s): CHOL, HDL, LDLCALC, TRIG, CHOLHDL, LDLDIRECT in the last 72 hours. Thyroid Function Tests: No results for input(s): TSH, T4TOTAL, FREET4, T3FREE, THYROIDAB in the last 72 hours. Anemia Panel: No results for input(s): VITAMINB12, FOLATE, FERRITIN, TIBC, IRON, RETICCTPCT in the last 72 hours. Urine analysis:    Component Value Date/Time   COLORURINE YELLOW 05/06/2015 1231   APPEARANCEUR CLEAR 05/06/2015 1231   LABSPEC 1.046 (H) 05/06/2015 1231   PHURINE 5.5 05/06/2015 1231   GLUCOSEU >1000 (A) 05/06/2015 1231   HGBUR NEGATIVE 05/06/2015 1231   BILIRUBINUR NEGATIVE 05/06/2015 1231     KETONESUR NEGATIVE 05/06/2015 1231   PROTEINUR NEGATIVE 05/06/2015 1231   UROBILINOGEN 0.2 09/21/2012 1842   NITRITE NEGATIVE 05/06/2015 1231   LEUKOCYTESUR NEGATIVE 05/06/2015 1231   Sepsis Labs: @LABRCNTIP (procalcitonin:4,lacticidven:4) )No results found for this or any previous visit (from the past 240 hour(s)).   Radiological Exams on Admission: Ct Angio Head W Or Wo Contrast  Result Date: 08/03/2017 CLINICAL DATA:  Chest pain last night, slurred speech this morning. LEFT extremity weakness, numbness and tingling. History of diabetes, contusion and subarachnoid hemorrhage. EXAM: CT ANGIOGRAPHY HEAD AND NECK CT PERFUSION BRAIN TECHNIQUE: Multidetector CT imaging of the head and neck was performed using the standard protocol during bolus administration of intravenous contrast. Multiplanar CT image reconstructions and MIPs were obtained to evaluate the vascular anatomy. Carotid stenosis measurements (when applicable) are obtained utilizing NASCET criteria, using the distal internal carotid diameter as the denominator. Multiphase CT imaging of the brain was performed following IV bolus contrast injection. Subsequent parametric perfusion maps were calculated using RAPID software. CONTRAST:  ISOVUE-370 IOPAMIDOL (ISOVUE-370) INJECTION 76% COMPARISON:  CT HEAD August 03, 2017 at 1727 hours FINDINGS: CTA NECK FINDINGS: AORTIC ARCH: Normal appearance of the thoracic arch, normal branch pattern. The origins of the innominate, left Common carotid artery and subclavian artery are patent. Moderate intimal thickening LEFT subclavian artery origin resulting in mild stenosis. RIGHT CAROTID SYSTEM: Common carotid artery is widely patent, coursing in a straight line fashion. Normal appearance of the carotid bifurcation without hemodynamically significant stenosis by NASCET criteria. Normal appearance of the internal carotid artery. LEFT CAROTID SYSTEM: Common carotid artery is widely patent, coursing in a  straight line fashion. Minimal intimal thickening carotid bifurcation without hemodynamically significant stenosis by NASCET criteria. Normal appearance of the internal carotid artery. VERTEBRAL ARTERIES:Codominant patent vertebral arteries. Focal severe stenosis distal LEFT V1 segment. Mild luminal irregularity LEFT V2 and V3 segments. SKELETON: No acute osseous process though bone windows have not been submitted. Moderate to severe LEFT C4-5 neural foraminal narrowing. Moderate LEFT C6-7 neural foraminal narrowing. OTHER NECK: Soft tissues of the neck are nonacute though, not tailored for evaluation. UPPER CHEST: Included lung apices are clear. No superior mediastinal lymphadenopathy. CTA HEAD FINDINGS: ANTERIOR CIRCULATION: Patent cervical internal carotid arteries, petrous, cavernous and supra clinoid internal carotid arteries. Patent anterior communicating artery. Incompletely fenestrated RIGHT A1 segment.  Patent anterior and middle cerebral arteries. No large vessel occlusion, significant stenosis, contrast extravasation or aneurysm. POSTERIOR CIRCULATION: Patent vertebral arteries, vertebrobasilar junction and basilar artery, as well as main branch vessels. Slight luminal irregularity basilar artery most compatible with atherosclerosis patent posterior cerebral arteries. Robust RIGHT posterior communicating artery present. No large vessel occlusion, significant stenosis, contrast extravasation or aneurysm. VENOUS SINUSES: Major dural venous sinuses are patent though not tailored for evaluation on this angiographic examination. ANATOMIC VARIANTS: None. DELAYED PHASE: Not performed. MIP images reviewed. CT Brain Perfusion Findings: CBF (<30%) Volume: 0mL Perfusion (Tmax>6.0s) volume: 0mL Mismatch Volume: 0mL Infarction Location:None IMPRESSION: CTA NECK: 1. Severe stenosis LEFT V1 segment consistent with dissection without pseudoaneurysm or occlusion. 2. No carotid artery stenosis. 3. Moderate to severe LEFT  C3-4 neural foraminal narrowing. CTA HEAD: 1. No emergent large vessel occlusion or flow limiting stenosis. CT PERFUSION: 1. Negative. Critical Value/emergent results were called by telephone at the time of interpretation on 08/03/2017 at 8:13 pm to Dr. Pricilla Loveless , who verbally acknowledged these results. Critical Value/emergent results were called by telephone at the time of interpretation on 08/03/2017 at 8:15 pm to Dr. Amada Jupiter, Neurology, who verbally acknowledged these results. Electronically Signed   By: Awilda Metro M.D.   On: 08/03/2017 20:19   Ct Head Wo Contrast  Result Date: 08/03/2017 CLINICAL DATA:  Left-sided numbness, slurred speech, chest pain and severe headache since yesterday at 4 p.m. EXAM: CT HEAD WITHOUT CONTRAST TECHNIQUE: Contiguous axial images were obtained from the base of the skull through the vertex without intravenous contrast. COMPARISON:  09/22/2012 FINDINGS: BRAIN: The ventricles and sulci are normal. No intraparenchymal hemorrhage, mass effect nor midline shift. No acute large vascular territory infarcts. Grey-white matter distinction is maintained. The basal ganglia are unremarkable. No abnormal extra-axial fluid collections. Basal cisterns are not effaced and midline. The brainstem and cerebellar hemispheres are without acute abnormalities. VASCULAR: No hyperdense vessel sign. Mild atherosclerosis of both carotid siphons. No unexpected calcifications. SKULL/SOFT TISSUES: No skull fracture. No significant soft tissue swelling. ORBITS/SINUSES: The included ocular globes and orbital contents are normal.The mastoid air cells are clear. The included paranasal sinuses are well-aerated. OTHER: None. IMPRESSION: No acute intracranial abnormality. Electronically Signed   By: Tollie Eth M.D.   On: 08/03/2017 17:54   Ct Angio Neck W Or Wo Contrast  Result Date: 08/03/2017 CLINICAL DATA:  Chest pain last night, slurred speech this morning. LEFT extremity weakness, numbness  and tingling. History of diabetes, contusion and subarachnoid hemorrhage. EXAM: CT ANGIOGRAPHY HEAD AND NECK CT PERFUSION BRAIN TECHNIQUE: Multidetector CT imaging of the head and neck was performed using the standard protocol during bolus administration of intravenous contrast. Multiplanar CT image reconstructions and MIPs were obtained to evaluate the vascular anatomy. Carotid stenosis measurements (when applicable) are obtained utilizing NASCET criteria, using the distal internal carotid diameter as the denominator. Multiphase CT imaging of the brain was performed following IV bolus contrast injection. Subsequent parametric perfusion maps were calculated using RAPID software. CONTRAST:  ISOVUE-370 IOPAMIDOL (ISOVUE-370) INJECTION 76% COMPARISON:  CT HEAD August 03, 2017 at 1727 hours FINDINGS: CTA NECK FINDINGS: AORTIC ARCH: Normal appearance of the thoracic arch, normal branch pattern. The origins of the innominate, left Common carotid artery and subclavian artery are patent. Moderate intimal thickening LEFT subclavian artery origin resulting in mild stenosis. RIGHT CAROTID SYSTEM: Common carotid artery is widely patent, coursing in a straight line fashion. Normal appearance of the carotid bifurcation without hemodynamically significant stenosis by NASCET criteria. Normal  appearance of the internal carotid artery. LEFT CAROTID SYSTEM: Common carotid artery is widely patent, coursing in a straight line fashion. Minimal intimal thickening carotid bifurcation without hemodynamically significant stenosis by NASCET criteria. Normal appearance of the internal carotid artery. VERTEBRAL ARTERIES:Codominant patent vertebral arteries. Focal severe stenosis distal LEFT V1 segment. Mild luminal irregularity LEFT V2 and V3 segments. SKELETON: No acute osseous process though bone windows have not been submitted. Moderate to severe LEFT C4-5 neural foraminal narrowing. Moderate LEFT C6-7 neural foraminal narrowing. OTHER  NECK: Soft tissues of the neck are nonacute though, not tailored for evaluation. UPPER CHEST: Included lung apices are clear. No superior mediastinal lymphadenopathy. CTA HEAD FINDINGS: ANTERIOR CIRCULATION: Patent cervical internal carotid arteries, petrous, cavernous and supra clinoid internal carotid arteries. Patent anterior communicating artery. Incompletely fenestrated RIGHT A1 segment. Patent anterior and middle cerebral arteries. No large vessel occlusion, significant stenosis, contrast extravasation or aneurysm. POSTERIOR CIRCULATION: Patent vertebral arteries, vertebrobasilar junction and basilar artery, as well as main branch vessels. Slight luminal irregularity basilar artery most compatible with atherosclerosis patent posterior cerebral arteries. Robust RIGHT posterior communicating artery present. No large vessel occlusion, significant stenosis, contrast extravasation or aneurysm. VENOUS SINUSES: Major dural venous sinuses are patent though not tailored for evaluation on this angiographic examination. ANATOMIC VARIANTS: None. DELAYED PHASE: Not performed. MIP images reviewed. CT Brain Perfusion Findings: CBF (<30%) Volume: 0mL Perfusion (Tmax>6.0s) volume: 0mL Mismatch Volume: 0mL Infarction Location:None IMPRESSION: CTA NECK: 1. Severe stenosis LEFT V1 segment consistent with dissection without pseudoaneurysm or occlusion. 2. No carotid artery stenosis. 3. Moderate to severe LEFT C3-4 neural foraminal narrowing. CTA HEAD: 1. No emergent large vessel occlusion or flow limiting stenosis. CT PERFUSION: 1. Negative. Critical Value/emergent results were called by telephone at the time of interpretation on 08/03/2017 at 8:13 pm to Dr. Pricilla Loveless , who verbally acknowledged these results. Critical Value/emergent results were called by telephone at the time of interpretation on 08/03/2017 at 8:15 pm to Dr. Amada Jupiter, Neurology, who verbally acknowledged these results. Electronically Signed   By: Awilda Metro M.D.   On: 08/03/2017 20:19   Ct Cerebral Perfusion W Contrast  Result Date: 08/03/2017 CLINICAL DATA:  Chest pain last night, slurred speech this morning. LEFT extremity weakness, numbness and tingling. History of diabetes, contusion and subarachnoid hemorrhage. EXAM: CT ANGIOGRAPHY HEAD AND NECK CT PERFUSION BRAIN TECHNIQUE: Multidetector CT imaging of the head and neck was performed using the standard protocol during bolus administration of intravenous contrast. Multiplanar CT image reconstructions and MIPs were obtained to evaluate the vascular anatomy. Carotid stenosis measurements (when applicable) are obtained utilizing NASCET criteria, using the distal internal carotid diameter as the denominator. Multiphase CT imaging of the brain was performed following IV bolus contrast injection. Subsequent parametric perfusion maps were calculated using RAPID software. CONTRAST:  ISOVUE-370 IOPAMIDOL (ISOVUE-370) INJECTION 76% COMPARISON:  CT HEAD August 03, 2017 at 1727 hours FINDINGS: CTA NECK FINDINGS: AORTIC ARCH: Normal appearance of the thoracic arch, normal branch pattern. The origins of the innominate, left Common carotid artery and subclavian artery are patent. Moderate intimal thickening LEFT subclavian artery origin resulting in mild stenosis. RIGHT CAROTID SYSTEM: Common carotid artery is widely patent, coursing in a straight line fashion. Normal appearance of the carotid bifurcation without hemodynamically significant stenosis by NASCET criteria. Normal appearance of the internal carotid artery. LEFT CAROTID SYSTEM: Common carotid artery is widely patent, coursing in a straight line fashion. Minimal intimal thickening carotid bifurcation without hemodynamically significant stenosis by NASCET criteria. Normal appearance of  the internal carotid artery. VERTEBRAL ARTERIES:Codominant patent vertebral arteries. Focal severe stenosis distal LEFT V1 segment. Mild luminal irregularity LEFT V2 and  V3 segments. SKELETON: No acute osseous process though bone windows have not been submitted. Moderate to severe LEFT C4-5 neural foraminal narrowing. Moderate LEFT C6-7 neural foraminal narrowing. OTHER NECK: Soft tissues of the neck are nonacute though, not tailored for evaluation. UPPER CHEST: Included lung apices are clear. No superior mediastinal lymphadenopathy. CTA HEAD FINDINGS: ANTERIOR CIRCULATION: Patent cervical internal carotid arteries, petrous, cavernous and supra clinoid internal carotid arteries. Patent anterior communicating artery. Incompletely fenestrated RIGHT A1 segment. Patent anterior and middle cerebral arteries. No large vessel occlusion, significant stenosis, contrast extravasation or aneurysm. POSTERIOR CIRCULATION: Patent vertebral arteries, vertebrobasilar junction and basilar artery, as well as main branch vessels. Slight luminal irregularity basilar artery most compatible with atherosclerosis patent posterior cerebral arteries. Robust RIGHT posterior communicating artery present. No large vessel occlusion, significant stenosis, contrast extravasation or aneurysm. VENOUS SINUSES: Major dural venous sinuses are patent though not tailored for evaluation on this angiographic examination. ANATOMIC VARIANTS: None. DELAYED PHASE: Not performed. MIP images reviewed. CT Brain Perfusion Findings: CBF (<30%) Volume: 0mL Perfusion (Tmax>6.0s) volume: 0mL Mismatch Volume: 0mL Infarction Location:None IMPRESSION: CTA NECK: 1. Severe stenosis LEFT V1 segment consistent with dissection without pseudoaneurysm or occlusion. 2. No carotid artery stenosis. 3. Moderate to severe LEFT C3-4 neural foraminal narrowing. CTA HEAD: 1. No emergent large vessel occlusion or flow limiting stenosis. CT PERFUSION: 1. Negative. Critical Value/emergent results were called by telephone at the time of interpretation on 08/03/2017 at 8:13 pm to Dr. Pricilla Loveless , who verbally acknowledged these results. Critical  Value/emergent results were called by telephone at the time of interpretation on 08/03/2017 at 8:15 pm to Dr. Amada Jupiter, Neurology, who verbally acknowledged these results. Electronically Signed   By: Awilda Metro M.D.   On: 08/03/2017 20:19   Dg Chest Port 1 View  Result Date: 08/03/2017 CLINICAL DATA:  Stroke symptoms.  Chest pain. EXAM: PORTABLE CHEST 1 VIEW COMPARISON:  05/06/2015; 09/21/2012 FINDINGS: Grossly unchanged cardiac silhouette and mediastinal contours given slightly reduced lung volumes. No focal parenchymal opacities. No pleural effusion or pneumothorax. No evidence of edema. No acute osseus abnormalities. IMPRESSION: No acute cardiopulmonary disease. Electronically Signed   By: Simonne Come M.D.   On: 08/03/2017 19:01   Ct Angio Chest/abd/pel For Dissection W And/or Wo Contrast  Result Date: 08/03/2017 CLINICAL DATA:  Acute chest and back pain, concern for dissection EXAM: CT ANGIOGRAPHY CHEST, ABDOMEN AND PELVIS TECHNIQUE: Multidetector CT imaging through the chest, abdomen and pelvis was performed using the standard protocol during bolus administration of intravenous contrast. Multiplanar reconstructed images and MIPs were obtained and reviewed to evaluate the vascular anatomy. CONTRAST:  ISOVUE-370 IOPAMIDOL (ISOVUE-370) INJECTION 76% COMPARISON:  None. FINDINGS: CTA CHEST FINDINGS Cardiovascular: Intact thoracic aorta. Negative for aneurysm, dissection, or intramural hematoma. No mediastinal hemorrhage or hematoma. Patent 3 vessel arch anatomy. Central pulmonary arteries are patent. Air within the pulmonary outflow tract related to peripheral IV access. Normal heart size. No pericardial or pleural effusion. Mediastinum/Nodes: No enlarged mediastinal, hilar, or axillary lymph nodes. Thyroid gland, trachea, and esophagus demonstrate no significant findings. Lungs/Pleura: Minor scattered basilar atelectasis. No focal pneumonia, collapse or consolidation. No focal airspace  process or hemorrhage. No pleural abnormality, effusion or pneumothorax. Trachea central airways are patent. Musculoskeletal: Thoracic spondylosis noted. No acute osseous finding or fracture. Intact sternum manubrium. Review of the MIP images confirms the above findings. CTA ABDOMEN AND  PELVIS FINDINGS VASCULAR Aorta: Intact abdominal aorta. Negative for aneurysm or dissection. No retroperitoneal hemorrhage or hematoma. Negative for rupture. Minor atherosclerotic change. Celiac: Widely patent including its branches SMA: Widely patent including its branches Renals: Widely patent IMA: Widely patent including its branches Inflow: Pelvic iliac vessels are widely patent. No inflow disease or occlusion. Negative for dissection or acute vascular process Veins: Dedicated venous imaging not performed Review of the MIP images confirms the above findings. NON-VASCULAR Hepatobiliary: No focal liver abnormality is seen. No gallstones, gallbladder wall thickening, or biliary dilatation. Pancreas: Unremarkable. No pancreatic ductal dilatation or surrounding inflammatory changes. Spleen: Normal in size without focal abnormality. Adrenals/Urinary Tract: Adrenal glands are unremarkable. Kidneys are normal, without renal calculi, focal lesion, or hydronephrosis. Bladder is unremarkable. Stomach/Bowel: Stomach is within normal limits. Appendix appears normal. No evidence of bowel wall thickening, distention, or inflammatory changes. Lymphatic: No adenopathy Reproductive: Mild prostate enlargement. Seminal vesicles are symmetric. Other: No abdominal wall hernia or abnormality. No abdominopelvic ascites. Musculoskeletal: Degenerative changes of the spine and facet joints. No acute osseous finding. Review of the MIP images confirms the above findings. IMPRESSION: Negative for acute aortic dissection.  No acute vascular finding. No acute intrathoracic finding No acute intra-abdominal or pelvic finding Electronically Signed   By: Judie PetitM.  Shick  M.D.   On: 08/03/2017 20:20     EKG: Independently reviewed.  Sinus rhythm, QTC 468, LAD, LAE, poor R-wave progression, nonspecific T-wave change.  Assessment/Plan Principal Problem:   Stroke-like symptoms Active Problems:   Esophageal reflux   Diabetes mellitus without complication (HCC)   Dissection of vertebral artery (HCC)   Chest pain   Stroke-like symptoms: CTA showed left V1 dissection,which does not explain his stroke-like symptoms on the left side. DD includes stroke and migraine headache. Neurology was consulted, Dr. Amada JupiterKirkpatrick recommended an MRI of her brain.  - will admit to tele bed as inpt - Highly appreciated neurologist's consultation - will follow up Neurology's Recs.  - Obtain MRI -- HTN <160 with IV prn hydralazine - start lipitor 80 mg daily - fasting lipid panel and HbA1c - 2D transthoracic echocardiography  - PT/OT consult - ASA 324 daily and pavix 75 mg daily following 300 mg load of plavix.  Dissection of vertebral artery: -on ASA and plavix as above  GERD: -Protonix  Diabetes mellitus without complication (HCC): Last A1c 10.6 on 09/21/12, poorly controled. Patient was on Farxiga before which was switched to metformin recently. CBG 332 -start Lantus 3 U daily -SSI  Chest pain: CTA has no dissection. Likely due to demand ischemia secondary to stroke-ike symptoms. - prn Nitroglycerin, Morphine, and aspirin, lipitor  - Risk factor stratification: will check FLP, UDS and A1C  - 2d echo   DVT ppx:  SQ Lovenox Code Status: Full code Family Communication:  Yes, patient's wife at bed side Disposition Plan:  Anticipate discharge back to previous home environment Consults called:  Neuro, Dr. Amada JupiterKirkpatrick Admission status: Inpatient/tele    Date of Service 08/03/2017    Lorretta HarpXilin Sadaf Przybysz [facility] Pager [phone]  If 7PM-7AM, please contact night-coverage [email or web address] Password [email or web address] 08/03/2017, 9:27 PM

## 2017-08-03 NOTE — Telephone Encounter (Signed)
Wife (Sheri Early) called to report left arm and hand numbness and pain. Sx started last night after church (after 8pm). Pain and numbness has been constant since that time. Pt stated "it hurts like hell." Pt stated he "can hear his heart beating". Pt admits to having headache, dizziness. Pt having slurred speech per his wife. Asked wife to have pt to hold both arms out in front. + left arm drift. Pt c/o chest hurting. Advised wife to call 911. Pt was outside weed eating before call.  Reason for Disposition . [1] Numbness (i.e., loss of sensation) of the face, arm / hand, or leg / foot on one side of the body AND [2] sudden onset AND [3] present now . [1] Weakness (i.e., paralysis, loss of muscle strength) of the face, arm / hand, or leg / foot on one side of the body AND [2] sudden onset AND [3] present now  Additional Information . Commented on: Difficult to awaken or acting confused (e.g., disoriented, slurred speech)    + slurred speech  Answer Assessment - Initial Assessment Questions 1. SYMPTOM: "What is the main symptom you are concerned about?" (e.g., weakness, numbness)     Numbness and pain to left arm -left shoulder and back  2. ONSET: "When did this start?" (minutes, hours, days; while sleeping)     Last night 3. LAST NORMAL: "When was the last time you were normal (no symptoms)?"     7-8 pm 4. PATTERN "Does this come and go, or has it been constant since it started?"  "Is it present now?"     constant 5. CARDIAC SYMPTOMS: "Have you had any of the following symptoms: chest pain, difficulty breathing, palpitations?"     "can hear heart beating"  6. NEUROLOGIC SYMPTOMS: "Have you had any of the following symptoms: headache, dizziness, vision loss, double vision, changes in speech, unsteady on your feet?"     Headache,dizziness, slurred speech, left arm drift 7. OTHER SYMPTOMS: "Do you have any other symptoms?"     Chest hurting 8. PREGNANCY: "Is there any chance you are pregnant?"  "When was your last menstrual period?"     n/a  Protocols used: NEUROLOGIC DEFICIT-A-AH

## 2017-08-03 NOTE — ED Notes (Signed)
Pt has lengthy timeline of symptoms. Pt states last night he began having left arm pain. This morning he woke up and threw up, but went to work. Then around 0930 began having problems with his slurred speech. Pt states around 1615 he began having left sided weakness to his left leg. Md Criss AlvineGoldston aware, at bedside evaluating the patient. Pt has left sided decreased sensation, left sided facial droop, unable to puff out his left cheek, weakness to left arm and leg with some dysarthria.

## 2017-08-03 NOTE — Consult Note (Addendum)
Neurology Consultation Reason for Consult: Left hemianopia Referring Physician: Criss Alvine, S  CC: Left hemianopia  History is obtained from:patient  HPI: [name] is a 57 y.o. male with a history of DM who presents with  left sided deficits since yesterday. He was last known well shortly before 5 PM on 4/16.  That evening, he began having numbness/painful paresthesia started in his shoulder and then migrated down his arm. His paresthesia started PRIOR to the headache. His leg to some degree is involved as well.  He also complains of some numbness in his face also.  He has a headache with photophobia and had some nausea this morning. The headache is FRONTAL in location.   When asked him about headaches, he initially denies, however his wife states that sometimes he does complain of severe headaches that make him want to lay down in dark room.  He also endorses some visual change, stating that he sees floaters.  LKW: 7pm 4/16 tpa given?: no, out of window Premorbid modified rankin scale: 0   ROS: A 14 point ROS was performed and is negative except as noted in the HPI.   Past Medical History:  Diagnosis Date  . Allergy   . Diabetes mellitus without complication (HCC)      Family History  Problem Relation Age of Onset  . Parkinson's disease Father   . Heart attack Maternal Grandmother      Social History:  reports that he has never smoked. He has never used smokeless tobacco. He reports that he does not drink alcohol or use drugs.   Exam: Current vital signs: BP (!) 149/79   Pulse 83   Temp 98 F (36.7 C) (Oral)   Resp 16   Ht 6\' 4"  (1.93 m)   Wt 102.1 kg (225 lb)   SpO2 96%   BMI 27.39 kg/m  Vital signs in last 24 hours: Temp:  [98 F (36.7 C)] 98 F (36.7 C) (04/17 1706) Pulse Rate:  [83-110] 83 (04/17 1845) Resp:  [16-19] 16 (04/17 1845) BP: (118-149)/(79-82) 149/79 (04/17 1845) SpO2:  [95 %-96 %] 96 % (04/17 1845) Weight:  [102.1 kg (225 lb)] 102.1  kg (225 lb) (04/17 1707)   Physical Exam  Constitutional: Appears well-developed and well-nourished.  Psych: Affect appropriate to situation Eyes: No scleral injection HENT: No OP obstrucion Head: Normocephalic.  Cardiovascular: Normal rate and regular rhythm.  Respiratory: Effort normal, non-labored breathing GI: Soft.  No distension. There is no tenderness.  Skin: WDI  Neuro: Mental Status: Patient is  Cranial Nerves: II: He has blurred vision in the left eye, intact visual fields in the right eye pupils are equal, round, and reactive to light.   III,IV, VI: EOMI without ptosis or diploplia.  V: Facial sensation is symmetric to temperature VII: Facial movement with mild left facial weakness VIII: hearing is intact to voice X: Uvula elevates symmetrically XI: Shoulder shrug is symmetric. XII: tongue is midline without atrophy or fasciculations.  Motor: He has mild weakness to confrontation, but no drift in the left arm or leg. Sensory: Sensation is diminished throughout the left side Cerebellar: No ataxia on finger-nose-finger or heel-knee-shin, however he does have slower heel-knee-shin on the left than the right  I have reviewed labs in epic and the results pertinent to this consultation are: CMP - elevated glucose.    I have reviewed the images obtained:CTP - no clear areas of infarct.  CTA - V1 dissection.   Impression: 57 yo M with  headache, left sided painful paresthesia and left EYE decreased vision. It is hard to tie his symptoms to the vertebral dissection, but certainly, there could be some emebellishment on real symptoms. With this finding, I feel that he does need admission and observation. He is not currently an IR candidate given mild symptoms, > 24 hour duration, lack of perfusion deficit on CTP.   There is no clear consensus on anticoagulation vs antiplatelet therapy in cervical artery dissection currently.   Recommendations: 1) MRI brain 2) BP goal <  160 systolic, would consider PRN labetalol.  3) glucose control 4) Frequent neuro checks, please notify neurology for any change in exam. 5) ASA 325mg  and plavix 300mg  load 6) Stroke team to follow.    [name] [name], MD [facility] [phone]  If 7pm- 7am, please page neurology on call as listed in AMION.

## 2017-08-04 ENCOUNTER — Inpatient Hospital Stay (HOSPITAL_COMMUNITY): Payer: BLUE CROSS/BLUE SHIELD

## 2017-08-04 ENCOUNTER — Inpatient Hospital Stay (HOSPITAL_COMMUNITY): Payer: Self-pay

## 2017-08-04 DIAGNOSIS — R2990 Unspecified symptoms and signs involving the nervous system: Secondary | ICD-10-CM | POA: Diagnosis not present

## 2017-08-04 DIAGNOSIS — E785 Hyperlipidemia, unspecified: Secondary | ICD-10-CM

## 2017-08-04 DIAGNOSIS — E1169 Type 2 diabetes mellitus with other specified complication: Secondary | ICD-10-CM

## 2017-08-04 DIAGNOSIS — R531 Weakness: Secondary | ICD-10-CM | POA: Diagnosis not present

## 2017-08-04 DIAGNOSIS — I7774 Dissection of vertebral artery: Secondary | ICD-10-CM | POA: Diagnosis not present

## 2017-08-04 DIAGNOSIS — R079 Chest pain, unspecified: Secondary | ICD-10-CM

## 2017-08-04 LAB — ECHOCARDIOGRAM COMPLETE
HEIGHTINCHES: 76 in
Weight: 3600 oz

## 2017-08-04 LAB — CBG MONITORING, ED
Glucose-Capillary: 179 mg/dL — ABNORMAL HIGH (ref 65–99)
Glucose-Capillary: 217 mg/dL — ABNORMAL HIGH (ref 65–99)
Glucose-Capillary: 281 mg/dL — ABNORMAL HIGH (ref 65–99)

## 2017-08-04 LAB — HIV ANTIBODY (ROUTINE TESTING W REFLEX): HIV Screen 4th Generation wRfx: NONREACTIVE

## 2017-08-04 LAB — LIPID PANEL
CHOL/HDL RATIO: 5.5 ratio
CHOLESTEROL: 216 mg/dL — AB (ref 0–200)
HDL: 39 mg/dL — ABNORMAL LOW (ref >40)
LDL Cholesterol: 113 mg/dL — ABNORMAL HIGH (ref 0–99)
Triglycerides: 318 mg/dL — ABNORMAL HIGH (ref <150)
VLDL: 64 mg/dL — ABNORMAL HIGH (ref 0–40)

## 2017-08-04 LAB — TROPONIN I: Troponin I: <0.03 ng/mL (ref <0.03)

## 2017-08-04 LAB — HEMOGLOBIN A1C
Hgb A1c MFr Bld: 10.2 % — ABNORMAL HIGH (ref 4.8–5.6)
MEAN PLASMA GLUCOSE: 246.04 mg/dL

## 2017-08-04 MED ORDER — METFORMIN HCL ER (MOD) 1000 MG PO TB24: 1000.0000 mg | ORAL_TABLET | Freq: Every day | ORAL | 0 refills | Status: DC

## 2017-08-04 MED ORDER — ALBUTEROL SULFATE HFA 108 (90 BASE) MCG/ACT IN AERS: 2.0000 | INHALATION_SPRAY | Freq: Four times a day (QID) | RESPIRATORY_TRACT | 2 refills | Status: DC | PRN

## 2017-08-04 MED ORDER — ATORVASTATIN CALCIUM 40 MG PO TABS: 40.0000 mg | ORAL_TABLET | Freq: Every day | ORAL | 1 refills | Status: DC

## 2017-08-04 MED ORDER — ASPIRIN EC 81 MG PO TBEC: 81.0000 mg | DELAYED_RELEASE_TABLET | Freq: Every day | ORAL | 0 refills | Status: DC

## 2017-08-04 NOTE — Evaluation (Signed)
Physical Therapy Evaluation Patient Details Name: [name] MRN: [id number] DOB: [birth year] Today's Date: 08/04/2017   History of Present Illness  Pt is a 57 y/o male admitted secondary to L extremity weakness and numbness and blurry vision in L eye. MRI, CTA of head and CT perfusion negative for acute abnormality. 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 PMH includes DM and asthma.   Clinical Impression  Pt admitted secondary to problem above with deficits below. Pt reporting numbness in L extremities and presenting with slightly decreased balance. Required min guard A and HHA for steadying throughout gait. Pt reports wife can assist as needed at home. Will continue to follow acutely to maximize functional mobility independence and safety.     Follow Up Recommendations Outpatient PT;Supervision for mobility/OOB    Equipment Recommendations  None recommended by PT    Recommendations for Other Services       Precautions / Restrictions Precautions Precautions: Fall Restrictions Weight Bearing Restrictions: No      Mobility  Bed Mobility Overal bed mobility: Modified Independent             General bed mobility comments: Increased time, however, no assist required.   Transfers Overall transfer level: Needs assistance Equipment used: None Transfers: Sit to/from Stand Sit to Stand: Min guard         General transfer comment: Min guard for steadying assist. Reports some lightheadedness during standing and required standing rest for symptoms to improve.   Ambulation/Gait Ambulation/Gait assistance: Min guard Ambulation Distance (Feet): 125 Feet Assistive device: 1 person hand held assist(IV pole) Gait Pattern/deviations: Step-through pattern;Decreased stride length Gait velocity: Decreased  Gait velocity interpretation: <1.8 ft/sec, indicate of risk for recurrent falls General Gait Details: Slow, cautious gait. Mild unsteadiness noted and required use  of BUEs for support. Min guard for safety.   Stairs            Wheelchair Mobility    Modified Rankin (Stroke Patients Only)       Balance Overall balance assessment: Needs assistance Sitting-balance support: No upper extremity supported;Feet supported Sitting balance-Leahy Scale: Good     Standing balance support: No upper extremity supported;Bilateral upper extremity supported;During functional activity Standing balance-Leahy Scale: Fair Standing balance comment: Able to maintain static standing without UE support.                              Pertinent Vitals/Pain Pain Assessment: Faces Faces Pain Scale: Hurts a little bit Pain Location: L arm Pain Descriptors / Indicators: Numbness;Guarding;Discomfort Pain Intervention(s): Limited activity within patient's tolerance;Monitored during session;Repositioned    Home Living Family/patient expects to be discharged to:: Private residence Living Arrangements: Spouse/significant other Available Help at Discharge: Family;Available 24 hours/day Type of Home: House Home Access: Stairs to enter Entrance Stairs-Rails: None Entrance Stairs-Number of Steps: 3 Home Layout: One level Home Equipment: Walker - 2 wheels;Cane - single point      Prior Function Level of Independence: Independent         Comments: Was still working.      Hand Dominance        Extremity/Trunk Assessment   Upper Extremity Assessment Upper Extremity Assessment: Defer to OT evaluation;LUE deficits/detail LUE Deficits / Details: Reports numbness throughout LUE. Noted slowed coordination with thumb to finger opposition.     Lower Extremity Assessment Lower Extremity Assessment: LLE deficits/detail LLE Deficits / Details: Strength grossly at 4+/5. Reports numbness in  thigh and calf area, however, did not report decreased sensation in foot.     Cervical / Trunk Assessment Cervical / Trunk Assessment: Normal  Communication    Communication: No difficulties  Cognition Arousal/Alertness: Awake/alert Behavior During Therapy: WFL for tasks assessed/performed Overall Cognitive Status: Within Functional Limits for tasks assessed                                        General Comments General comments (skin integrity, edema, etc.): Pt's wife present during session. Educated about recommendations for follow up PT and pt agreeable.     Exercises     Assessment/Plan    PT Assessment Patient needs continued PT services  PT Problem List Impaired sensation;Decreased balance;Decreased mobility;Decreased knowledge of use of DME;Decreased knowledge of precautions;Pain       PT Treatment Interventions DME instruction;Gait training;Stair training;Functional mobility training;Therapeutic exercise;Therapeutic activities;Balance training;Patient/family education    PT Goals (Current goals can be found in the Care Plan section)  Acute Rehab PT Goals Patient Stated Goal: to go home  PT Goal Formulation: With patient Time For Goal Achievement: 08/18/17 Potential to Achieve Goals: Good    Frequency Min 3X/week   Barriers to discharge        Co-evaluation               AM-PAC PT "6 Clicks" Daily Activity  Outcome Measure Difficulty turning over in bed (including adjusting bedclothes, sheets and blankets)?: None Difficulty moving from lying on back to sitting on the side of the bed? : A Little Difficulty sitting down on and standing up from a chair with arms (e.g., wheelchair, bedside commode, etc,.)?: Unable Help needed moving to and from a bed to chair (including a wheelchair)?: A Little Help needed walking in hospital room?: A Little Help needed climbing 3-5 steps with a railing? : A Little 6 Click Score: 17    End of Session Equipment Utilized During Treatment: Gait belt Activity Tolerance: Patient tolerated treatment well Patient left: in bed;with call bell/phone within reach;with  family/visitor present Nurse Communication: Mobility status PT Visit Diagnosis: Unsteadiness on feet (R26.81);Other abnormalities of gait and mobility (R26.89);Other symptoms and signs involving the nervous system (R29.898)    Time: 1610-9604 PT Time Calculation (min) (ACUTE ONLY): 36 min   Charges:   PT Evaluation $PT Eval Low Complexity: 1 Low PT Treatments $Gait Training: 8-22 mins   PT G Codes:        Gladys Damme, PT, DPT  Acute Rehabilitation Services  Pager: [phone]   Lehman Prom 08/04/2017, 3:54 PM

## 2017-08-04 NOTE — ED Notes (Signed)
Pt resting no needs

## 2017-08-04 NOTE — ED Notes (Signed)
Wife: [phone]

## 2017-08-04 NOTE — ED Notes (Signed)
Dr. Rito EhrlichKrishnan is speaking to pt's family member regarding dispo to home.

## 2017-08-04 NOTE — Progress Notes (Signed)
  Echocardiogram 2D Echocardiogram has been performed.  Erin Uecker G Vinay Ertl 08/04/2017, 1:06 PM

## 2017-08-04 NOTE — ED Notes (Signed)
Per Md, pt can eat carb modified diet. Will put in order. RN notified kitchen

## 2017-08-04 NOTE — ED Notes (Signed)
Paged Dr. Virginia RochesterSendil Krishnan [facility] @ [phone].

## 2017-08-04 NOTE — Progress Notes (Signed)
STROKE TEAM PROGRESS NOTE   [name] is a 57 y.o. male with a history of DM who presents with  left sided deficits since yesterday. He was last known well shortly before 5 PM on 4/16.  That evening, he began having numbness/painful paresthesia started in his shoulder and then migrated down his arm. His paresthesia started PRIOR to the headache. His leg to some degree is involved as well.  He also complains of some numbness in his face also.  He has a headache with photophobia and had some nausea this morning. The headache is FRONTAL in location.   When asked him about headaches, he initially denies, however his wife states that sometimes he does complain of severe headaches that make him want to lay down in dark room.  He also endorses some visual change, stating that he sees floaters.  LKW: 7pm 4/16 tpa given?: no, out of window Premorbid modified rankin scale: 0   INTERVAL HISTORY Patient informs me that he has had severe pain in the left upper extremity as his main reason for coming to the hospital.  He denies any recent history of neck pain fall or injury.  MRI scan of the brain shows no acute infarct and CT angiogram shows a small left vertebral artery focal dissection with with severe stenosis which is likely old finding related to remote head injury several years ago.   Vitals:   08/04/17 1030 08/04/17 1144 08/04/17 1152 08/04/17 1245  BP: (!) 143/95 140/86    Pulse: 85  76 67  Resp: (!) 24  15 15   Temp:      TempSrc:      SpO2: 94%  96% 96%  Weight:      Height:        CBC:  CBC Latest Ref Rng & Units 08/03/2017 08/03/2017 05/07/2015  WBC 4.0 - 10.5 K/uL - 8.2 6.2  Hemoglobin 13.0 - 17.0 g/dL 17.3(H) 17.0 15.4  Hematocrit 39.0 - 52.0 % 51.0 49.2 45.6  Platelets 150 - 400 K/uL - 184 139(L)     Comprehensive Metabolic Panel:   CMP Latest Ref Rng & Units 08/03/2017 08/03/2017 05/07/2015  Glucose 65 - 99 mg/dL 409(W332(H) 119(J331(H) 478(G167(H)  BUN 6 - 20 mg/dL 17 15 16    Creatinine 0.61 - 1.24 mg/dL 9.560.80 2.130.96 0.860.82  Sodium 135 - 145 mmol/L 138 135 137  Potassium 3.5 - 5.1 mmol/L 3.7 3.8 3.6  Chloride 101 - 111 mmol/L 101 101 102  CO2 22 - 32 mmol/L - 22 23  Calcium 8.9 - 10.3 mg/dL - 9.3 5.7(Q8.6(L)  Total Protein 6.5 - 8.1 g/dL - 7.5 6.5  Total Bilirubin 0.3 - 1.2 mg/dL - 0.9 1.2  Alkaline Phos 38 - 126 U/L - 116 69  AST 15 - 41 U/L - 18 18  ALT 17 - 63 U/L - 20 17   Lipid Panel:     Component Value Date/Time   CHOL 216 (H) 08/04/2017 0317   TRIG 318 (H) 08/04/2017 0317   HDL 39 (L) 08/04/2017 0317   CHOLHDL 5.5 08/04/2017 0317   VLDL 64 (H) 08/04/2017 0317   LDLCALC 113 (H) 08/04/2017 0317   HgbA1c:  Lab Results  Component Value Date   HGBA1C 10.2 (H) 08/04/2017   IMAGING Ct Angio Head W Or Wo Contrast  Result Date: 08/03/2017 CLINICAL DATA:  Chest pain last night, slurred speech this morning. LEFT extremity weakness, numbness and tingling. History of diabetes, contusion and subarachnoid hemorrhage. EXAM: CT ANGIOGRAPHY  HEAD AND NECK CT PERFUSION BRAIN TECHNIQUE: Multidetector CT imaging of the head and neck was performed using the standard protocol during bolus administration of intravenous contrast. Multiplanar CT image reconstructions and MIPs were obtained to evaluate the vascular anatomy. Carotid stenosis measurements (when applicable) are obtained utilizing NASCET criteria, using the distal internal carotid diameter as the denominator. Multiphase CT imaging of the brain was performed following IV bolus contrast injection. Subsequent parametric perfusion maps were calculated using RAPID software. CONTRAST:  ISOVUE-370 IOPAMIDOL (ISOVUE-370) INJECTION 76% COMPARISON:  CT HEAD August 03, 2017 at 1727 hours FINDINGS: CTA NECK FINDINGS: AORTIC ARCH: Normal appearance of the thoracic arch, normal branch pattern. The origins of the innominate, left Common carotid artery and subclavian artery are patent. Moderate intimal thickening LEFT subclavian  artery origin resulting in mild stenosis. RIGHT CAROTID SYSTEM: Common carotid artery is widely patent, coursing in a straight line fashion. Normal appearance of the carotid bifurcation without hemodynamically significant stenosis by NASCET criteria. Normal appearance of the internal carotid artery. LEFT CAROTID SYSTEM: Common carotid artery is widely patent, coursing in a straight line fashion. Minimal intimal thickening carotid bifurcation without hemodynamically significant stenosis by NASCET criteria. Normal appearance of the internal carotid artery. VERTEBRAL ARTERIES:Codominant patent vertebral arteries. Focal severe stenosis distal LEFT V1 segment. Mild luminal irregularity LEFT V2 and V3 segments. SKELETON: No acute osseous process though bone windows have not been submitted. Moderate to severe LEFT C4-5 neural foraminal narrowing. Moderate LEFT C6-7 neural foraminal narrowing. OTHER NECK: Soft tissues of the neck are nonacute though, not tailored for evaluation. UPPER CHEST: Included lung apices are clear. No superior mediastinal lymphadenopathy. CTA HEAD FINDINGS: ANTERIOR CIRCULATION: Patent cervical internal carotid arteries, petrous, cavernous and supra clinoid internal carotid arteries. Patent anterior communicating artery. Incompletely fenestrated RIGHT A1 segment. Patent anterior and middle cerebral arteries. No large vessel occlusion, significant stenosis, contrast extravasation or aneurysm. POSTERIOR CIRCULATION: Patent vertebral arteries, vertebrobasilar junction and basilar artery, as well as main branch vessels. Slight luminal irregularity basilar artery most compatible with atherosclerosis patent posterior cerebral arteries. Robust RIGHT posterior communicating artery present. No large vessel occlusion, significant stenosis, contrast extravasation or aneurysm. VENOUS SINUSES: Major dural venous sinuses are patent though not tailored for evaluation on this angiographic examination. ANATOMIC  VARIANTS: None. DELAYED PHASE: Not performed. MIP images reviewed. CT Brain Perfusion Findings: CBF (<30%) Volume: 0mL Perfusion (Tmax>6.0s) volume: 0mL Mismatch Volume: 0mL Infarction Location:None IMPRESSION: CTA NECK: 1. Severe stenosis LEFT V1 segment consistent with dissection without pseudoaneurysm or occlusion. 2. No carotid artery stenosis. 3. Moderate to severe LEFT C3-4 neural foraminal narrowing. CTA HEAD: 1. No emergent large vessel occlusion or flow limiting stenosis. CT PERFUSION: 1. Negative. Critical Value/emergent results were called by telephone at the time of interpretation on 08/03/2017 at 8:13 pm to Dr. Pricilla Loveless , who verbally acknowledged these results. Critical Value/emergent results were called by telephone at the time of interpretation on 08/03/2017 at 8:15 pm to Dr. Amada Jupiter, Neurology, who verbally acknowledged these results. Electronically Signed   By: Awilda Metro M.D.   On: 08/03/2017 20:19   Ct Head Wo Contrast  Result Date: 08/03/2017 CLINICAL DATA:  Left-sided numbness, slurred speech, chest pain and severe headache since yesterday at 4 p.m. EXAM: CT HEAD WITHOUT CONTRAST TECHNIQUE: Contiguous axial images were obtained from the base of the skull through the vertex without intravenous contrast. COMPARISON:  09/22/2012 FINDINGS: BRAIN: The ventricles and sulci are normal. No intraparenchymal hemorrhage, mass effect nor midline shift. No acute large vascular territory  infarcts. Grey-white matter distinction is maintained. The basal ganglia are unremarkable. No abnormal extra-axial fluid collections. Basal cisterns are not effaced and midline. The brainstem and cerebellar hemispheres are without acute abnormalities. VASCULAR: No hyperdense vessel sign. Mild atherosclerosis of both carotid siphons. No unexpected calcifications. SKULL/SOFT TISSUES: No skull fracture. No significant soft tissue swelling. ORBITS/SINUSES: The included ocular globes and orbital contents are  normal.The mastoid air cells are clear. The included paranasal sinuses are well-aerated. OTHER: None. IMPRESSION: No acute intracranial abnormality. Electronically Signed   By: Tollie Eth M.D.   On: 08/03/2017 17:54   Ct Angio Neck W Or Wo Contrast  Result Date: 08/03/2017 CLINICAL DATA:  Chest pain last night, slurred speech this morning. LEFT extremity weakness, numbness and tingling. History of diabetes, contusion and subarachnoid hemorrhage. EXAM: CT ANGIOGRAPHY HEAD AND NECK CT PERFUSION BRAIN TECHNIQUE: Multidetector CT imaging of the head and neck was performed using the standard protocol during bolus administration of intravenous contrast. Multiplanar CT image reconstructions and MIPs were obtained to evaluate the vascular anatomy. Carotid stenosis measurements (when applicable) are obtained utilizing NASCET criteria, using the distal internal carotid diameter as the denominator. Multiphase CT imaging of the brain was performed following IV bolus contrast injection. Subsequent parametric perfusion maps were calculated using RAPID software. CONTRAST:  ISOVUE-370 IOPAMIDOL (ISOVUE-370) INJECTION 76% COMPARISON:  CT HEAD August 03, 2017 at 1727 hours FINDINGS: CTA NECK FINDINGS: AORTIC ARCH: Normal appearance of the thoracic arch, normal branch pattern. The origins of the innominate, left Common carotid artery and subclavian artery are patent. Moderate intimal thickening LEFT subclavian artery origin resulting in mild stenosis. RIGHT CAROTID SYSTEM: Common carotid artery is widely patent, coursing in a straight line fashion. Normal appearance of the carotid bifurcation without hemodynamically significant stenosis by NASCET criteria. Normal appearance of the internal carotid artery. LEFT CAROTID SYSTEM: Common carotid artery is widely patent, coursing in a straight line fashion. Minimal intimal thickening carotid bifurcation without hemodynamically significant stenosis by NASCET criteria. Normal  appearance of the internal carotid artery. VERTEBRAL ARTERIES:Codominant patent vertebral arteries. Focal severe stenosis distal LEFT V1 segment. Mild luminal irregularity LEFT V2 and V3 segments. SKELETON: No acute osseous process though bone windows have not been submitted. Moderate to severe LEFT C4-5 neural foraminal narrowing. Moderate LEFT C6-7 neural foraminal narrowing. OTHER NECK: Soft tissues of the neck are nonacute though, not tailored for evaluation. UPPER CHEST: Included lung apices are clear. No superior mediastinal lymphadenopathy. CTA HEAD FINDINGS: ANTERIOR CIRCULATION: Patent cervical internal carotid arteries, petrous, cavernous and supra clinoid internal carotid arteries. Patent anterior communicating artery. Incompletely fenestrated RIGHT A1 segment. Patent anterior and middle cerebral arteries. No large vessel occlusion, significant stenosis, contrast extravasation or aneurysm. POSTERIOR CIRCULATION: Patent vertebral arteries, vertebrobasilar junction and basilar artery, as well as main branch vessels. Slight luminal irregularity basilar artery most compatible with atherosclerosis patent posterior cerebral arteries. Robust RIGHT posterior communicating artery present. No large vessel occlusion, significant stenosis, contrast extravasation or aneurysm. VENOUS SINUSES: Major dural venous sinuses are patent though not tailored for evaluation on this angiographic examination. ANATOMIC VARIANTS: None. DELAYED PHASE: Not performed. MIP images reviewed. CT Brain Perfusion Findings: CBF (<30%) Volume: 0mL Perfusion (Tmax>6.0s) volume: 0mL Mismatch Volume: 0mL Infarction Location:None IMPRESSION: CTA NECK: 1. Severe stenosis LEFT V1 segment consistent with dissection without pseudoaneurysm or occlusion. 2. No carotid artery stenosis. 3. Moderate to severe LEFT C3-4 neural foraminal narrowing. CTA HEAD: 1. No emergent large vessel occlusion or flow limiting stenosis. CT PERFUSION: 1. Negative. Critical  Value/emergent  results were called by telephone at the time of interpretation on 08/03/2017 at 8:13 pm to Dr. Pricilla Loveless , who verbally acknowledged these results. Critical Value/emergent results were called by telephone at the time of interpretation on 08/03/2017 at 8:15 pm to Dr. Amada Jupiter, Neurology, who verbally acknowledged these results. Electronically Signed   By: Awilda Metro M.D.   On: 08/03/2017 20:19   Mr Brain Wo Contrast  Result Date: 08/04/2017 CLINICAL DATA:  Initial evaluation for acute left-sided weakness. EXAM: MRI HEAD WITHOUT CONTRAST TECHNIQUE: Multiplanar, multiecho pulse sequences of the brain and surrounding structures were obtained without intravenous contrast. COMPARISON:  Prior studies from earlier the same day. FINDINGS: Brain: Cerebral volume within normal limits for age. No significant cerebral white matter changes identified. No abnormal foci of restricted diffusion to suggest acute or subacute ischemia. Gray-white matter differentiation maintained. No encephalomalacia to suggest chronic infarction. No evidence for acute or chronic intracranial hemorrhage. No mass lesion, midline shift or mass effect. No hydrocephalus. No extra-axial fluid collection. Major dural sinuses grossly patent. Pituitary gland and suprasellar region normal. Midline structures intact and normal. Vascular: Major intracranial vascular flow voids are well maintained. Skull and upper cervical spine: Craniocervical junction normal. Bone marrow signal intensity normal. Scalp soft tissues unremarkable. Sinuses/Orbits: Globes and orbital soft tissues within normal limits. Mild scattered mucosal thickening within the ethmoidal air cells and maxillary sinuses. Paranasal sinuses are otherwise clear. Small bilateral mastoid effusions, left slightly larger than right, of doubtful significance. Other: None. IMPRESSION: Normal brain MRI.  No acute intracranial abnormality identified. Electronically Signed   By:  Rise Mu M.D.   On: 08/04/2017 00:40   Ct Cerebral Perfusion W Contrast  Result Date: 08/03/2017 CLINICAL DATA:  Chest pain last night, slurred speech this morning. LEFT extremity weakness, numbness and tingling. History of diabetes, contusion and subarachnoid hemorrhage. EXAM: CT ANGIOGRAPHY HEAD AND NECK CT PERFUSION BRAIN TECHNIQUE: Multidetector CT imaging of the head and neck was performed using the standard protocol during bolus administration of intravenous contrast. Multiplanar CT image reconstructions and MIPs were obtained to evaluate the vascular anatomy. Carotid stenosis measurements (when applicable) are obtained utilizing NASCET criteria, using the distal internal carotid diameter as the denominator. Multiphase CT imaging of the brain was performed following IV bolus contrast injection. Subsequent parametric perfusion maps were calculated using RAPID software. CONTRAST:  ISOVUE-370 IOPAMIDOL (ISOVUE-370) INJECTION 76% COMPARISON:  CT HEAD August 03, 2017 at 1727 hours FINDINGS: CTA NECK FINDINGS: AORTIC ARCH: Normal appearance of the thoracic arch, normal branch pattern. The origins of the innominate, left Common carotid artery and subclavian artery are patent. Moderate intimal thickening LEFT subclavian artery origin resulting in mild stenosis. RIGHT CAROTID SYSTEM: Common carotid artery is widely patent, coursing in a straight line fashion. Normal appearance of the carotid bifurcation without hemodynamically significant stenosis by NASCET criteria. Normal appearance of the internal carotid artery. LEFT CAROTID SYSTEM: Common carotid artery is widely patent, coursing in a straight line fashion. Minimal intimal thickening carotid bifurcation without hemodynamically significant stenosis by NASCET criteria. Normal appearance of the internal carotid artery. VERTEBRAL ARTERIES:Codominant patent vertebral arteries. Focal severe stenosis distal LEFT V1 segment. Mild luminal irregularity  LEFT V2 and V3 segments. SKELETON: No acute osseous process though bone windows have not been submitted. Moderate to severe LEFT C4-5 neural foraminal narrowing. Moderate LEFT C6-7 neural foraminal narrowing. OTHER NECK: Soft tissues of the neck are nonacute though, not tailored for evaluation. UPPER CHEST: Included lung apices are clear. No superior mediastinal lymphadenopathy. CTA  HEAD FINDINGS: ANTERIOR CIRCULATION: Patent cervical internal carotid arteries, petrous, cavernous and supra clinoid internal carotid arteries. Patent anterior communicating artery. Incompletely fenestrated RIGHT A1 segment. Patent anterior and middle cerebral arteries. No large vessel occlusion, significant stenosis, contrast extravasation or aneurysm. POSTERIOR CIRCULATION: Patent vertebral arteries, vertebrobasilar junction and basilar artery, as well as main branch vessels. Slight luminal irregularity basilar artery most compatible with atherosclerosis patent posterior cerebral arteries. Robust RIGHT posterior communicating artery present. No large vessel occlusion, significant stenosis, contrast extravasation or aneurysm. VENOUS SINUSES: Major dural venous sinuses are patent though not tailored for evaluation on this angiographic examination. ANATOMIC VARIANTS: None. DELAYED PHASE: Not performed. MIP images reviewed. CT Brain Perfusion Findings: CBF (<30%) Volume: 0mL Perfusion (Tmax>6.0s) volume: 0mL Mismatch Volume: 0mL Infarction Location:None IMPRESSION: CTA NECK: 1. Severe stenosis LEFT V1 segment consistent with dissection without pseudoaneurysm or occlusion. 2. No carotid artery stenosis. 3. Moderate to severe LEFT C3-4 neural foraminal narrowing. CTA HEAD: 1. No emergent large vessel occlusion or flow limiting stenosis. CT PERFUSION: 1. Negative. Critical Value/emergent results were called by telephone at the time of interpretation on 08/03/2017 at 8:13 pm to Dr. Pricilla Loveless , who verbally acknowledged these results.  Critical Value/emergent results were called by telephone at the time of interpretation on 08/03/2017 at 8:15 pm to Dr. Amada Jupiter, Neurology, who verbally acknowledged these results. Electronically Signed   By: Awilda Metro M.D.   On: 08/03/2017 20:19   Dg Chest Port 1 View  Result Date: 08/03/2017 CLINICAL DATA:  Stroke symptoms.  Chest pain. EXAM: PORTABLE CHEST 1 VIEW COMPARISON:  05/06/2015; 09/21/2012 FINDINGS: Grossly unchanged cardiac silhouette and mediastinal contours given slightly reduced lung volumes. No focal parenchymal opacities. No pleural effusion or pneumothorax. No evidence of edema. No acute osseus abnormalities. IMPRESSION: No acute cardiopulmonary disease. Electronically Signed   By: Simonne Come M.D.   On: 08/03/2017 19:01   Ct Angio Chest/abd/pel For Dissection W And/or Wo Contrast  Result Date: 08/03/2017 CLINICAL DATA:  Acute chest and back pain, concern for dissection EXAM: CT ANGIOGRAPHY CHEST, ABDOMEN AND PELVIS TECHNIQUE: Multidetector CT imaging through the chest, abdomen and pelvis was performed using the standard protocol during bolus administration of intravenous contrast. Multiplanar reconstructed images and MIPs were obtained and reviewed to evaluate the vascular anatomy. CONTRAST:  ISOVUE-370 IOPAMIDOL (ISOVUE-370) INJECTION 76% COMPARISON:  None. FINDINGS: CTA CHEST FINDINGS Cardiovascular: Intact thoracic aorta. Negative for aneurysm, dissection, or intramural hematoma. No mediastinal hemorrhage or hematoma. Patent 3 vessel arch anatomy. Central pulmonary arteries are patent. Air within the pulmonary outflow tract related to peripheral IV access. Normal heart size. No pericardial or pleural effusion. Mediastinum/Nodes: No enlarged mediastinal, hilar, or axillary lymph nodes. Thyroid gland, trachea, and esophagus demonstrate no significant findings. Lungs/Pleura: Minor scattered basilar atelectasis. No focal pneumonia, collapse or consolidation. No focal  airspace process or hemorrhage. No pleural abnormality, effusion or pneumothorax. Trachea central airways are patent. Musculoskeletal: Thoracic spondylosis noted. No acute osseous finding or fracture. Intact sternum manubrium. Review of the MIP images confirms the above findings. CTA ABDOMEN AND PELVIS FINDINGS VASCULAR Aorta: Intact abdominal aorta. Negative for aneurysm or dissection. No retroperitoneal hemorrhage or hematoma. Negative for rupture. Minor atherosclerotic change. Celiac: Widely patent including its branches SMA: Widely patent including its branches Renals: Widely patent IMA: Widely patent including its branches Inflow: Pelvic iliac vessels are widely patent. No inflow disease or occlusion. Negative for dissection or acute vascular process Veins: Dedicated venous imaging not performed Review of the MIP images confirms the above  findings. NON-VASCULAR Hepatobiliary: No focal liver abnormality is seen. No gallstones, gallbladder wall thickening, or biliary dilatation. Pancreas: Unremarkable. No pancreatic ductal dilatation or surrounding inflammatory changes. Spleen: Normal in size without focal abnormality. Adrenals/Urinary Tract: Adrenal glands are unremarkable. Kidneys are normal, without renal calculi, focal lesion, or hydronephrosis. Bladder is unremarkable. Stomach/Bowel: Stomach is within normal limits. Appendix appears normal. No evidence of bowel wall thickening, distention, or inflammatory changes. Lymphatic: No adenopathy Reproductive: Mild prostate enlargement. Seminal vesicles are symmetric. Other: No abdominal wall hernia or abnormality. No abdominopelvic ascites. Musculoskeletal: Degenerative changes of the spine and facet joints. No acute osseous finding. Review of the MIP images confirms the above findings. IMPRESSION: Negative for acute aortic dissection.  No acute vascular finding. No acute intrathoracic finding No acute intra-abdominal or pelvic finding Electronically Signed   By:  Judie Petit.  Shick M.D.   On: 08/03/2017 20:20    PHYSICAL EXAM Pleasant middle-age Caucasian male currently not in distress. Constitutional: Appears well-developed and well-nourished.  Psych: Affect appropriate to situation Eyes: No scleral injection HENT: No OP obstrucion Head: Normocephalic.  Cardiovascular: Normal rate and regular rhythm.  Respiratory: Effort normal, non-labored breathing GI: Soft.  No distension. There is no tenderness.  Skin: WDI  Neuro: Mental Status: Patient is  Cranial Nerves: II: He has blurred vision in the left eye, intact visual fields in the right eye pupils are equal, round, and reactive to light.   III,IV, VI: EOMI without ptosis or diploplia.  V: Facial sensation is symmetric to temperature VII: Facial movement with mild left facial weakness VIII: hearing is intact to voice X: Uvula elevates symmetrically XI: Shoulder shrug is symmetric. XII: tongue is midline without atrophy or fasciculations.  Motor: He has mild weakness to confrontation, but no drift in the left arm or leg. Sensory: Sensation is diminished throughout the left side Cerebellar: No ataxia on finger-nose-finger or heel-knee-shin, however he does have slower heel-knee-shin on the left than the right   ASSESSMENT/PLAN [name] is a 57 y.o. male with history of DM presenting with Left sided deficits x 1 day - hemianopia, LUE paresthesias, HA  L arm pain, paresthesia. No acute stroke/TIA. L VA dissection felt to be chronic from fall 10 ys ago  Concern for functional overlay  CTA head no ELVO  CTA neck severe L VA stenosis w/ dissection. Mod L C3-4 neural foraminal narrowing  CT perfusion negative  MRI  normal  2D Echo  EF 55-60%. No source of embolus   LDL 113  HgbA1c 10.2  Lovenox 40 mg sq daily for VTE prophylaxis  Fall precautions  Diet Carb Modified Fluid consistency: Thin; Room service appropriate? Yes  No antithrombotic prior to admission, now on  aspirin 325 mg daily and clopidogrel 75 mg daily following plavix load.   Therapy recommendations:  OP PT  Disposition:  Pending   Hyperlipidemia  Home meds:  Fish oil  Now on lipitor 80 mg daily  LDL 113, goal < 70  Continue statin at discharge  Diabetes type II  HgbA1c 10.2, goal < 7.0  Uncontrolled  Diabetes coordinator following Dog nevus possible body Other Active Problems  Chest pain  GERD  Hospital day # 1  Annie Main, MSN, APRN, ANVP-BC, AGPCNP-BC Advanced Practice Stroke Nurse [facility] See Amion for Schedule & Pager information 08/04/2017 4:04 PM  I have personally examined this patient, reviewed notes, independently viewed imaging studies, participated in medical decision making and plan of care.ROS completed by me personally  and pertinent positives fully documented  I have made any additions or clarifications directly to the above note. Agree with note above.  Patient's presentation symptoms do not seem to suggest acute cerebral ischemia and MRI is negative for acute stroke.  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  Recommend aspirin for stroke prevention and aggressive risk factor modification.  Patient wants to go home and may be discharged and he can follow-up electively as an outpatient in neurology clinic.  Discussed with Dr. Rito Ehrlich.  Greater than 50% time during this 25-minute visit was spent on counseling and coordination of care about his symptoms and review and discussion of MRI and CT angiogram findings and answering questions. Delia Heady, MD Medical Director [facility] Pager: [phone] 08/04/2017 7:57 PM  To contact Stroke Continuity provider, please refer to [email or web address]. After hours, contact General Neurology with associated bronchitis and one will be

## 2017-08-04 NOTE — ED Notes (Signed)
Called [facility]W to give report, Marcelle SmilingNatasha receiving RN requested for this nurse to call her back.  She is discharging a pt.

## 2017-08-04 NOTE — ED Notes (Signed)
PT at bedside to evaluate

## 2017-08-04 NOTE — ED Notes (Signed)
Family at bedside. 

## 2017-08-04 NOTE — ED Notes (Signed)
Pt's CBG result was 179. Informed Elizabeth - RN.

## 2017-08-04 NOTE — Progress Notes (Addendum)
Inpatient Diabetes Program Recommendations  AACE/ADA: New Consensus Statement on Inpatient Glycemic Control (2015)  Target Ranges:  Prepandial:   less than 140 mg/dL      Peak postprandial:   less than 180 mg/dL (1-2 hours)      Critically ill patients:  140 - 180 mg/dL   Lab Results  Component Value Date   GLUCAP 281 (H) 08/04/2017   HGBA1C 10.2 (H) 08/04/2017    Review of Glycemic Control Results for Rondel JumboEAGUE, Kem G (MRN [id number]) as of 08/04/2017 10:37  Ref. Range 08/03/2017 18:51 08/04/2017 00:51 08/04/2017 08:18  Glucose-Capillary Latest Ref Range: 65 - 99 mg/dL 409265 (H) 811217 (H) 914281 (H)   Diabetes history: Type 2 DM Outpatient Diabetes medications: Farxiga 10 mg QD (per MD note recently changed to Metformin, will determine dosage) Current orders for Inpatient glycemic control: Novolog 0-9 units TID, Lantus 3 units QD  Inpatient Diabetes Program Recommendations:    Noted patient received Lantus 3 units and fasting BS following this was 281 mg/dL. Per patient's weight, consider increasing Lantus to 15 units QD (102.3 kg x 0.15) and adding Novolog 0-5 units QHS.  When patient gets settled in room, will plan to discuss A1C.  Thanks, Lujean RaveLauren Mariyah Upshaw, MSN, RNC-OB Diabetes Coordinator [phone] (8a-5p)

## 2017-08-04 NOTE — ED Notes (Signed)
Echo at bedside

## 2017-08-04 NOTE — Discharge Summary (Signed)
Discharge Summary  [name]:[id number] DOB: Aug 20, 1960  PCP: Darci Needle, MD  Admit date: 08/03/2017 Discharge date: 08/04/2017  Time spent:  45 minutes  Recommendations for Outpatient Follow-up:  1. New medication: Lipitor 40 mg nightly 2. Medication change: Metformin increased to 1000 mg XR daily 3. Medication change: Patient strongly advised to avoid over-the-counter sexual aid stimulant medications 4. New medication: Aspirin 81 mg p.o. daily 5. New medication: Albuterol inhaler as needed 6. Patient being referred to outpatient physical therapy  Discharge Diagnoses:  Active Hospital Problems   Diagnosis Date Noted  . Stroke-like symptoms 08/03/2017  . 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 Chest pain   . Diabetes mellitus without complication (HCC) 05/07/2015  . Esophageal reflux 05/10/2007  Chronic diastolic heart failure  Resolved Hospital Problems  No resolved problems to display.    Discharge Condition: Somewhat improved, being discharged home  Diet recommendation: Carb modified  Vitals:   08/04/17 1245 08/04/17 1600  BP:  127/77  Pulse: 67 78  Resp: 15 15  Temp:    SpO2: 96% 94%    History of present illness:   [name] is a 57 y.o. male with medical history significant of diabetes mellitus, asthma, GERD, SDH, varicose vein in leg, who presents with stroke-like symptoms and chest pain.  Pt was LKN before 5:00 PM on 4/16. He developed left-sided numbness and weakness, involving left arm, leg and the left face in the evening. He went to work this AM, and then developed headache with photophobia. He also had nausea and vomited several times. No diarrhea or abdominal pain. He states that he still has mild numbness and tingling in left arm and the leg, and the mild weakness in left leg and arm. Per his wife, patient had slurred speech and blurry vision in left eye. Pt states that he has chest pain, which is located in the substernal  area, 7 out of 10 in severity, radiating to the left shoulder, pressure-like, nonradiating. No shortness rest. Denies cough, fever or chills. No symptoms of UTI.  ED Course: pt was found to have negative troponin, WBC 8.2, INR 0.97, electrolytes renal function okay, temperature normal, oxygen saturation 92% on room air. Chest x-ray negative. CT head is negative. CT angiogram for chest/abdomen and pelvis negative. CT angiogram of head and neck did not show LVO, but showed left V1 dissection. CT of head perfusion study is negative. Pt is admitted to telemetry bed as inpatient. Neurology, Dr. Amada Jupiter was consulted.    Hospital Course:  Principal Problem:   Stroke-like symptoms: Patient's exam very inconsistent, concerning for.  With normal MRI and Dopplers and echo,.  CT noted questionable dissection of left vertebral, however this would not account for patient's type of symptoms.  In evaluation, he had a fall in the past which led to a head bleed and it is suspected that this is probably where the dissection occurred and what is being viewed now is old healed dissection rather than an acute finding.  Neurology recommended aspirin, better control of diabetes and cholesterol.  Patient given prescriptions   Esophageal reflux   Diabetes mellitus without complication (HCC), uncontrolled, type II without long-term use of insulin: A1c at 10.2.  Patient on metformin 500 daily.  Have changed prescription to increase to 1000 daily and have advised the patient for better control and follow with his PCP.   Dissection of vertebral artery (HCC)   Chest pain: Very atypical.  When asked if pain is  intermittent or continuous, he states both.  When asked if pain is burning or sharp or heavy, he says yes to all of those.  He is unable to give a very good history for any of this.  I strongly suspect that most of this is stress.  According to his wife, patient is quite overworked.  Initial EKG unrevealing as was cardiac  markers.  Heart score of 4.  Echocardiogram done noted only grade 1 diastolic dysfunction no signs of scarring or other abnormalities.  Have started patient on daily aspirin already.  Advised wife and patient that he needs to stay away from over-the-counter sexual stimulant medications which she has been taking which depending on ingredients could put patient at risk for arrhythmia versus blood pressure spike versus CVA.  In case this may be some bronchospasm, have given a as needed albuterol inhaler  In discussion with wife, patient is very stressed at work, works 80+ hours a week and he does not stop.  It is possible his symptoms may be stress-induced and psychosomatic.  She also reports behavior where when patient starts a task he will finish it regardless including mowing the grass at night with a flashlight or getting off the stretcher when the ambulance came to finish working when they looked to be a short delay before the ambulance could take him to the emergency room.  This may indeed be stress related but he also shows questionable signs of OCD.  I urged the wife to have a discussion with the patient's PCP for further evaluation   Procedures:  Echocardiogram done 4/18: No evidence of embolus, preserved ejection fraction.  Grade 1 diastolic dysfunction  Consultations:  Neurology  Discharge Exam: BP 127/77   Pulse 78   Temp 98.4 F (36.9 C) (Axillary)   Resp 15   Ht 6\' 4"  (1.93 m)   Wt 102.1 kg (225 lb)   SpO2 94%   BMI 27.39 kg/m   General: Alert and oriented x3, no acute distress Cardiovascular: Regular rate and rhythm, S1-S2 Respiratory: Clear to auscultation bilaterally  Discharge Instructions You were cared for by a hospitalist during your hospital stay. If you have any questions about your discharge medications or the care you received while you were in the hospital after you are discharged, you can call the unit and asked to speak with the hospitalist on call if the  hospitalist that took care of you is not available. Once you are discharged, your primary care physician will handle any further medical issues. Please note that NO REFILLS for any discharge medications will be authorized once you are discharged, as it is imperative that you return to your primary care physician (or establish a relationship with a primary care physician if you do not have one) for your aftercare needs so that they can reassess your need for medications and monitor your lab values.   Allergies as of 08/04/2017      Reactions   Latex Rash      Medication List    STOP taking these medications   OVER THE COUNTER MEDICATION     TAKE these medications   albuterol 108 (90 Base) MCG/ACT inhaler Commonly known as:  PROVENTIL HFA;VENTOLIN HFA Inhale 2 puffs into the lungs every 6 (six) hours as needed for wheezing or shortness of breath.   ANDROGEL 20.25 MG/1.25GM (1.62%) Gel Generic drug:  Testosterone Place 2 application onto the skin daily. 2 pumps on each thigh daily   aspirin EC 81 MG tablet  Take 1 tablet (81 mg total) by mouth daily.   atorvastatin 40 MG tablet Commonly known as:  LIPITOR Take 1 tablet (40 mg total) by mouth daily at 6 PM.   Fish Oil 1000 MG Caps Take by mouth. Liquid form   metFORMIN 1000 MG (MOD) 24 hr tablet Commonly known as:  GLUMETZA Take 1 tablet (1,000 mg total) by mouth daily with breakfast. What changed:    medication strength  how much to take  when to take this      Allergies  Allergen Reactions  . Latex Rash   Follow-up Information    Darci Needle, MD Follow up in 1 month(s).   Specialty:  Endocrinology Contact information: [address] [place] [address] [phone]            The results of significant diagnostics from this hospitalization (including imaging, microbiology, ancillary and laboratory) are listed below for reference.    Significant Diagnostic Studies: Ct Angio Head W Or Wo  Contrast  Result Date: 08/03/2017 CLINICAL DATA:  Chest pain last night, slurred speech this morning. LEFT extremity weakness, numbness and tingling. History of diabetes, contusion and subarachnoid hemorrhage. EXAM: CT ANGIOGRAPHY HEAD AND NECK CT PERFUSION BRAIN TECHNIQUE: Multidetector CT imaging of the head and neck was performed using the standard protocol during bolus administration of intravenous contrast. Multiplanar CT image reconstructions and MIPs were obtained to evaluate the vascular anatomy. Carotid stenosis measurements (when applicable) are obtained utilizing NASCET criteria, using the distal internal carotid diameter as the denominator. Multiphase CT imaging of the brain was performed following IV bolus contrast injection. Subsequent parametric perfusion maps were calculated using RAPID software. CONTRAST:  ISOVUE-370 IOPAMIDOL (ISOVUE-370) INJECTION 76% COMPARISON:  CT HEAD August 03, 2017 at 1727 hours FINDINGS: CTA NECK FINDINGS: AORTIC ARCH: Normal appearance of the thoracic arch, normal branch pattern. The origins of the innominate, left Common carotid artery and subclavian artery are patent. Moderate intimal thickening LEFT subclavian artery origin resulting in mild stenosis. RIGHT CAROTID SYSTEM: Common carotid artery is widely patent, coursing in a straight line fashion. Normal appearance of the carotid bifurcation without hemodynamically significant stenosis by NASCET criteria. Normal appearance of the internal carotid artery. LEFT CAROTID SYSTEM: Common carotid artery is widely patent, coursing in a straight line fashion. Minimal intimal thickening carotid bifurcation without hemodynamically significant stenosis by NASCET criteria. Normal appearance of the internal carotid artery. VERTEBRAL ARTERIES:Codominant patent vertebral arteries. Focal severe stenosis distal LEFT V1 segment. Mild luminal irregularity LEFT V2 and V3 segments. SKELETON: No acute osseous process though bone  windows have not been submitted. Moderate to severe LEFT C4-5 neural foraminal narrowing. Moderate LEFT C6-7 neural foraminal narrowing. OTHER NECK: Soft tissues of the neck are nonacute though, not tailored for evaluation. UPPER CHEST: Included lung apices are clear. No superior mediastinal lymphadenopathy. CTA HEAD FINDINGS: ANTERIOR CIRCULATION: Patent cervical internal carotid arteries, petrous, cavernous and supra clinoid internal carotid arteries. Patent anterior communicating artery. Incompletely fenestrated RIGHT A1 segment. Patent anterior and middle cerebral arteries. No large vessel occlusion, significant stenosis, contrast extravasation or aneurysm. POSTERIOR CIRCULATION: Patent vertebral arteries, vertebrobasilar junction and basilar artery, as well as main branch vessels. Slight luminal irregularity basilar artery most compatible with atherosclerosis patent posterior cerebral arteries. Robust RIGHT posterior communicating artery present. No large vessel occlusion, significant stenosis, contrast extravasation or aneurysm. VENOUS SINUSES: Major dural venous sinuses are patent though not tailored for evaluation on this angiographic examination. ANATOMIC VARIANTS: None. DELAYED PHASE: Not performed. MIP images reviewed.  CT Brain Perfusion Findings: CBF (<30%) Volume: 0mL Perfusion (Tmax>6.0s) volume: 0mL Mismatch Volume: 0mL Infarction Location:None IMPRESSION: CTA NECK: 1. Severe stenosis LEFT V1 segment consistent with dissection without pseudoaneurysm or occlusion. 2. No carotid artery stenosis. 3. Moderate to severe LEFT C3-4 neural foraminal narrowing. CTA HEAD: 1. No emergent large vessel occlusion or flow limiting stenosis. CT PERFUSION: 1. Negative. Critical Value/emergent results were called by telephone at the time of interpretation on 08/03/2017 at 8:13 pm to Dr. Pricilla Loveless , who verbally acknowledged these results. Critical Value/emergent results were called by telephone at the time of  interpretation on 08/03/2017 at 8:15 pm to Dr. Amada Jupiter, Neurology, who verbally acknowledged these results. Electronically Signed   By: Awilda Metro M.D.   On: 08/03/2017 20:19   Ct Head Wo Contrast  Result Date: 08/03/2017 CLINICAL DATA:  Left-sided numbness, slurred speech, chest pain and severe headache since yesterday at 4 p.m. EXAM: CT HEAD WITHOUT CONTRAST TECHNIQUE: Contiguous axial images were obtained from the base of the skull through the vertex without intravenous contrast. COMPARISON:  09/22/2012 FINDINGS: BRAIN: The ventricles and sulci are normal. No intraparenchymal hemorrhage, mass effect nor midline shift. No acute large vascular territory infarcts. Grey-white matter distinction is maintained. The basal ganglia are unremarkable. No abnormal extra-axial fluid collections. Basal cisterns are not effaced and midline. The brainstem and cerebellar hemispheres are without acute abnormalities. VASCULAR: No hyperdense vessel sign. Mild atherosclerosis of both carotid siphons. No unexpected calcifications. SKULL/SOFT TISSUES: No skull fracture. No significant soft tissue swelling. ORBITS/SINUSES: The included ocular globes and orbital contents are normal.The mastoid air cells are clear. The included paranasal sinuses are well-aerated. OTHER: None. IMPRESSION: No acute intracranial abnormality. Electronically Signed   By: Tollie Eth M.D.   On: 08/03/2017 17:54   Ct Angio Neck W Or Wo Contrast  Result Date: 08/03/2017 CLINICAL DATA:  Chest pain last night, slurred speech this morning. LEFT extremity weakness, numbness and tingling. History of diabetes, contusion and subarachnoid hemorrhage. EXAM: CT ANGIOGRAPHY HEAD AND NECK CT PERFUSION BRAIN TECHNIQUE: Multidetector CT imaging of the head and neck was performed using the standard protocol during bolus administration of intravenous contrast. Multiplanar CT image reconstructions and MIPs were obtained to evaluate the vascular anatomy. Carotid  stenosis measurements (when applicable) are obtained utilizing NASCET criteria, using the distal internal carotid diameter as the denominator. Multiphase CT imaging of the brain was performed following IV bolus contrast injection. Subsequent parametric perfusion maps were calculated using RAPID software. CONTRAST:  ISOVUE-370 IOPAMIDOL (ISOVUE-370) INJECTION 76% COMPARISON:  CT HEAD August 03, 2017 at 1727 hours FINDINGS: CTA NECK FINDINGS: AORTIC ARCH: Normal appearance of the thoracic arch, normal branch pattern. The origins of the innominate, left Common carotid artery and subclavian artery are patent. Moderate intimal thickening LEFT subclavian artery origin resulting in mild stenosis. RIGHT CAROTID SYSTEM: Common carotid artery is widely patent, coursing in a straight line fashion. Normal appearance of the carotid bifurcation without hemodynamically significant stenosis by NASCET criteria. Normal appearance of the internal carotid artery. LEFT CAROTID SYSTEM: Common carotid artery is widely patent, coursing in a straight line fashion. Minimal intimal thickening carotid bifurcation without hemodynamically significant stenosis by NASCET criteria. Normal appearance of the internal carotid artery. VERTEBRAL ARTERIES:Codominant patent vertebral arteries. Focal severe stenosis distal LEFT V1 segment. Mild luminal irregularity LEFT V2 and V3 segments. SKELETON: No acute osseous process though bone windows have not been submitted. Moderate to severe LEFT C4-5 neural foraminal narrowing. Moderate LEFT C6-7 neural foraminal narrowing. OTHER NECK:  Soft tissues of the neck are nonacute though, not tailored for evaluation. UPPER CHEST: Included lung apices are clear. No superior mediastinal lymphadenopathy. CTA HEAD FINDINGS: ANTERIOR CIRCULATION: Patent cervical internal carotid arteries, petrous, cavernous and supra clinoid internal carotid arteries. Patent anterior communicating artery. Incompletely fenestrated  RIGHT A1 segment. Patent anterior and middle cerebral arteries. No large vessel occlusion, significant stenosis, contrast extravasation or aneurysm. POSTERIOR CIRCULATION: Patent vertebral arteries, vertebrobasilar junction and basilar artery, as well as main branch vessels. Slight luminal irregularity basilar artery most compatible with atherosclerosis patent posterior cerebral arteries. Robust RIGHT posterior communicating artery present. No large vessel occlusion, significant stenosis, contrast extravasation or aneurysm. VENOUS SINUSES: Major dural venous sinuses are patent though not tailored for evaluation on this angiographic examination. ANATOMIC VARIANTS: None. DELAYED PHASE: Not performed. MIP images reviewed. CT Brain Perfusion Findings: CBF (<30%) Volume: 0mL Perfusion (Tmax>6.0s) volume: 0mL Mismatch Volume: 0mL Infarction Location:None IMPRESSION: CTA NECK: 1. Severe stenosis LEFT V1 segment consistent with dissection without pseudoaneurysm or occlusion. 2. No carotid artery stenosis. 3. Moderate to severe LEFT C3-4 neural foraminal narrowing. CTA HEAD: 1. No emergent large vessel occlusion or flow limiting stenosis. CT PERFUSION: 1. Negative. Critical Value/emergent results were called by telephone at the time of interpretation on 08/03/2017 at 8:13 pm to Dr. Pricilla Loveless , who verbally acknowledged these results. Critical Value/emergent results were called by telephone at the time of interpretation on 08/03/2017 at 8:15 pm to Dr. Amada Jupiter, Neurology, who verbally acknowledged these results. Electronically Signed   By: Awilda Metro M.D.   On: 08/03/2017 20:19   Mr Brain Wo Contrast  Result Date: 08/04/2017 CLINICAL DATA:  Initial evaluation for acute left-sided weakness. EXAM: MRI HEAD WITHOUT CONTRAST TECHNIQUE: Multiplanar, multiecho pulse sequences of the brain and surrounding structures were obtained without intravenous contrast. COMPARISON:  Prior studies from earlier the same day.  FINDINGS: Brain: Cerebral volume within normal limits for age. No significant cerebral white matter changes identified. No abnormal foci of restricted diffusion to suggest acute or subacute ischemia. Gray-white matter differentiation maintained. No encephalomalacia to suggest chronic infarction. No evidence for acute or chronic intracranial hemorrhage. No mass lesion, midline shift or mass effect. No hydrocephalus. No extra-axial fluid collection. Major dural sinuses grossly patent. Pituitary gland and suprasellar region normal. Midline structures intact and normal. Vascular: Major intracranial vascular flow voids are well maintained. Skull and upper cervical spine: Craniocervical junction normal. Bone marrow signal intensity normal. Scalp soft tissues unremarkable. Sinuses/Orbits: Globes and orbital soft tissues within normal limits. Mild scattered mucosal thickening within the ethmoidal air cells and maxillary sinuses. Paranasal sinuses are otherwise clear. Small bilateral mastoid effusions, left slightly larger than right, of doubtful significance. Other: None. IMPRESSION: Normal brain MRI.  No acute intracranial abnormality identified. Electronically Signed   By: Rise Mu M.D.   On: 08/04/2017 00:40   Ct Cerebral Perfusion W Contrast  Result Date: 08/03/2017 CLINICAL DATA:  Chest pain last night, slurred speech this morning. LEFT extremity weakness, numbness and tingling. History of diabetes, contusion and subarachnoid hemorrhage. EXAM: CT ANGIOGRAPHY HEAD AND NECK CT PERFUSION BRAIN TECHNIQUE: Multidetector CT imaging of the head and neck was performed using the standard protocol during bolus administration of intravenous contrast. Multiplanar CT image reconstructions and MIPs were obtained to evaluate the vascular anatomy. Carotid stenosis measurements (when applicable) are obtained utilizing NASCET criteria, using the distal internal carotid diameter as the denominator. Multiphase CT imaging  of the brain was performed following IV bolus contrast injection. Subsequent parametric perfusion maps  were calculated using RAPID software. CONTRAST:  ISOVUE-370 IOPAMIDOL (ISOVUE-370) INJECTION 76% COMPARISON:  CT HEAD August 03, 2017 at 1727 hours FINDINGS: CTA NECK FINDINGS: AORTIC ARCH: Normal appearance of the thoracic arch, normal branch pattern. The origins of the innominate, left Common carotid artery and subclavian artery are patent. Moderate intimal thickening LEFT subclavian artery origin resulting in mild stenosis. RIGHT CAROTID SYSTEM: Common carotid artery is widely patent, coursing in a straight line fashion. Normal appearance of the carotid bifurcation without hemodynamically significant stenosis by NASCET criteria. Normal appearance of the internal carotid artery. LEFT CAROTID SYSTEM: Common carotid artery is widely patent, coursing in a straight line fashion. Minimal intimal thickening carotid bifurcation without hemodynamically significant stenosis by NASCET criteria. Normal appearance of the internal carotid artery. VERTEBRAL ARTERIES:Codominant patent vertebral arteries. Focal severe stenosis distal LEFT V1 segment. Mild luminal irregularity LEFT V2 and V3 segments. SKELETON: No acute osseous process though bone windows have not been submitted. Moderate to severe LEFT C4-5 neural foraminal narrowing. Moderate LEFT C6-7 neural foraminal narrowing. OTHER NECK: Soft tissues of the neck are nonacute though, not tailored for evaluation. UPPER CHEST: Included lung apices are clear. No superior mediastinal lymphadenopathy. CTA HEAD FINDINGS: ANTERIOR CIRCULATION: Patent cervical internal carotid arteries, petrous, cavernous and supra clinoid internal carotid arteries. Patent anterior communicating artery. Incompletely fenestrated RIGHT A1 segment. Patent anterior and middle cerebral arteries. No large vessel occlusion, significant stenosis, contrast extravasation or aneurysm. POSTERIOR  CIRCULATION: Patent vertebral arteries, vertebrobasilar junction and basilar artery, as well as main branch vessels. Slight luminal irregularity basilar artery most compatible with atherosclerosis patent posterior cerebral arteries. Robust RIGHT posterior communicating artery present. No large vessel occlusion, significant stenosis, contrast extravasation or aneurysm. VENOUS SINUSES: Major dural venous sinuses are patent though not tailored for evaluation on this angiographic examination. ANATOMIC VARIANTS: None. DELAYED PHASE: Not performed. MIP images reviewed. CT Brain Perfusion Findings: CBF (<30%) Volume: 0mL Perfusion (Tmax>6.0s) volume: 0mL Mismatch Volume: 0mL Infarction Location:None IMPRESSION: CTA NECK: 1. Severe stenosis LEFT V1 segment consistent with dissection without pseudoaneurysm or occlusion. 2. No carotid artery stenosis. 3. Moderate to severe LEFT C3-4 neural foraminal narrowing. CTA HEAD: 1. No emergent large vessel occlusion or flow limiting stenosis. CT PERFUSION: 1. Negative. Critical Value/emergent results were called by telephone at the time of interpretation on 08/03/2017 at 8:13 pm to Dr. Pricilla Loveless , who verbally acknowledged these results. Critical Value/emergent results were called by telephone at the time of interpretation on 08/03/2017 at 8:15 pm to Dr. Amada Jupiter, Neurology, who verbally acknowledged these results. Electronically Signed   By: Awilda Metro M.D.   On: 08/03/2017 20:19   Dg Chest Port 1 View  Result Date: 08/03/2017 CLINICAL DATA:  Stroke symptoms.  Chest pain. EXAM: PORTABLE CHEST 1 VIEW COMPARISON:  05/06/2015; 09/21/2012 FINDINGS: Grossly unchanged cardiac silhouette and mediastinal contours given slightly reduced lung volumes. No focal parenchymal opacities. No pleural effusion or pneumothorax. No evidence of edema. No acute osseus abnormalities. IMPRESSION: No acute cardiopulmonary disease. Electronically Signed   By: Simonne Come M.D.   On: 08/03/2017  19:01   Ct Angio Chest/abd/pel For Dissection W And/or Wo Contrast  Result Date: 08/03/2017 CLINICAL DATA:  Acute chest and back pain, concern for dissection EXAM: CT ANGIOGRAPHY CHEST, ABDOMEN AND PELVIS TECHNIQUE: Multidetector CT imaging through the chest, abdomen and pelvis was performed using the standard protocol during bolus administration of intravenous contrast. Multiplanar reconstructed images and MIPs were obtained and reviewed to evaluate the vascular anatomy. CONTRAST:  ISOVUE-370  IOPAMIDOL (ISOVUE-370) INJECTION 76% COMPARISON:  None. FINDINGS: CTA CHEST FINDINGS Cardiovascular: Intact thoracic aorta. Negative for aneurysm, dissection, or intramural hematoma. No mediastinal hemorrhage or hematoma. Patent 3 vessel arch anatomy. Central pulmonary arteries are patent. Air within the pulmonary outflow tract related to peripheral IV access. Normal heart size. No pericardial or pleural effusion. Mediastinum/Nodes: No enlarged mediastinal, hilar, or axillary lymph nodes. Thyroid gland, trachea, and esophagus demonstrate no significant findings. Lungs/Pleura: Minor scattered basilar atelectasis. No focal pneumonia, collapse or consolidation. No focal airspace process or hemorrhage. No pleural abnormality, effusion or pneumothorax. Trachea central airways are patent. Musculoskeletal: Thoracic spondylosis noted. No acute osseous finding or fracture. Intact sternum manubrium. Review of the MIP images confirms the above findings. CTA ABDOMEN AND PELVIS FINDINGS VASCULAR Aorta: Intact abdominal aorta. Negative for aneurysm or dissection. No retroperitoneal hemorrhage or hematoma. Negative for rupture. Minor atherosclerotic change. Celiac: Widely patent including its branches SMA: Widely patent including its branches Renals: Widely patent IMA: Widely patent including its branches Inflow: Pelvic iliac vessels are widely patent. No inflow disease or occlusion. Negative for dissection or acute vascular  process Veins: Dedicated venous imaging not performed Review of the MIP images confirms the above findings. NON-VASCULAR Hepatobiliary: No focal liver abnormality is seen. No gallstones, gallbladder wall thickening, or biliary dilatation. Pancreas: Unremarkable. No pancreatic ductal dilatation or surrounding inflammatory changes. Spleen: Normal in size without focal abnormality. Adrenals/Urinary Tract: Adrenal glands are unremarkable. Kidneys are normal, without renal calculi, focal lesion, or hydronephrosis. Bladder is unremarkable. Stomach/Bowel: Stomach is within normal limits. Appendix appears normal. No evidence of bowel wall thickening, distention, or inflammatory changes. Lymphatic: No adenopathy Reproductive: Mild prostate enlargement. Seminal vesicles are symmetric. Other: No abdominal wall hernia or abnormality. No abdominopelvic ascites. Musculoskeletal: Degenerative changes of the spine and facet joints. No acute osseous finding. Review of the MIP images confirms the above findings. IMPRESSION: Negative for acute aortic dissection.  No acute vascular finding. No acute intrathoracic finding No acute intra-abdominal or pelvic finding Electronically Signed   By: Judie Petit.  Shick M.D.   On: 08/03/2017 20:20    Microbiology: No results found for this or any previous visit (from the past 240 hour(s)).   Labs: Basic Metabolic Panel: Recent Labs  Lab 08/03/17 1714 08/03/17 1756  NA 135 138  K 3.8 3.7  CL 101 101  CO2 22  --   GLUCOSE 331* 332*  BUN 15 17  CREATININE 0.96 0.80  CALCIUM 9.3  --    Liver Function Tests: Recent Labs  Lab 08/03/17 1714  AST 18  ALT 20  ALKPHOS 116  BILITOT 0.9  PROT 7.5  ALBUMIN 3.9   No results for input(s): LIPASE, AMYLASE in the last 168 hours. No results for input(s): AMMONIA in the last 168 hours. CBC: Recent Labs  Lab 08/03/17 1714 08/03/17 1756  WBC 8.2  --   NEUTROABS 5.2  --   HGB 17.0 17.3*  HCT 49.2 51.0  MCV 89.6  --   PLT 184  --     Cardiac Enzymes: Recent Labs  Lab 08/03/17 2224 08/04/17 0317 08/04/17 0856  TROPONINI <0.03 <0.03 <0.03   BNP: BNP (last 3 results) No results for input(s): BNP in the last 8760 hours.  ProBNP (last 3 results) No results for input(s): PROBNP in the last 8760 hours.  CBG: Recent Labs  Lab 08/03/17 1851 08/04/17 0051 08/04/17 0818 08/04/17 1500  GLUCAP 265* 217* 281* 179*       Signed:  Hollice Espy, MD  [facility] 08/04/2017, 4:45 PM

## 2017-08-04 NOTE — ED Notes (Signed)
PT consulted.  Ambulated pt-she is recommending a walker for pt when ambulating and does okay with just one assist.  Pt is alert and appears comfortable. He is asking when he gets to go home.  He reports chest pressure rating it at 6/10.  In NAD.

## 2017-08-05 ENCOUNTER — Telehealth: Payer: Self-pay | Admitting: *Deleted

## 2017-08-05 NOTE — Telephone Encounter (Signed)
Ricky Furnishonna Fellmy, RN of [facility].  [facility] WILL NOT be able to see this pt as pt PCP Juleen China([place]) does not sign HH orders (historically).

## 2017-08-06 NOTE — Care Management (Signed)
08/06/17 1530 Received call from wife who was upset that [facility] has not contacted them and has no record of them when she called.  She and patient state that he has new PCP at same practice, Dr. Renne CriglerPharr. [facility] contacted and pt will be contacted today or tomorrow.  Wife updated.

## 2017-08-08 DIAGNOSIS — K219 Gastro-esophageal reflux disease without esophagitis: Secondary | ICD-10-CM | POA: Diagnosis not present

## 2017-08-08 DIAGNOSIS — I69354 Hemiplegia and hemiparesis following cerebral infarction affecting left non-dominant side: Secondary | ICD-10-CM | POA: Diagnosis not present

## 2017-08-08 DIAGNOSIS — J45909 Unspecified asthma, uncomplicated: Secondary | ICD-10-CM | POA: Diagnosis not present

## 2017-08-08 DIAGNOSIS — Z7951 Long term (current) use of inhaled steroids: Secondary | ICD-10-CM | POA: Diagnosis not present

## 2017-08-08 DIAGNOSIS — H538 Other visual disturbances: Secondary | ICD-10-CM | POA: Diagnosis not present

## 2017-08-08 DIAGNOSIS — E119 Type 2 diabetes mellitus without complications: Secondary | ICD-10-CM | POA: Diagnosis not present

## 2017-08-08 DIAGNOSIS — I69398 Other sequelae of cerebral infarction: Secondary | ICD-10-CM | POA: Diagnosis not present

## 2017-08-08 DIAGNOSIS — Z7982 Long term (current) use of aspirin: Secondary | ICD-10-CM | POA: Diagnosis not present

## 2017-08-08 DIAGNOSIS — Z794 Long term (current) use of insulin: Secondary | ICD-10-CM | POA: Diagnosis not present

## 2017-08-09 ENCOUNTER — Encounter: Payer: Self-pay | Admitting: Neurology

## 2017-08-09 ENCOUNTER — Telehealth: Payer: Self-pay | Admitting: Family Medicine

## 2017-08-09 ENCOUNTER — Ambulatory Visit: Payer: BLUE CROSS/BLUE SHIELD | Admitting: Family Medicine

## 2017-08-09 ENCOUNTER — Ambulatory Visit: Payer: Self-pay

## 2017-08-09 ENCOUNTER — Encounter: Payer: Self-pay | Admitting: Family Medicine

## 2017-08-09 ENCOUNTER — Ambulatory Visit: Payer: Self-pay | Admitting: Nurse Practitioner

## 2017-08-09 VITALS — BP 120/80 | HR 89 | Ht 76.0 in | Wt 238.2 lb

## 2017-08-09 DIAGNOSIS — E1169 Type 2 diabetes mellitus with other specified complication: Secondary | ICD-10-CM | POA: Diagnosis not present

## 2017-08-09 DIAGNOSIS — E785 Hyperlipidemia, unspecified: Secondary | ICD-10-CM | POA: Diagnosis not present

## 2017-08-09 DIAGNOSIS — E291 Testicular hypofunction: Secondary | ICD-10-CM

## 2017-08-09 DIAGNOSIS — R29898 Other symptoms and signs involving the musculoskeletal system: Secondary | ICD-10-CM | POA: Diagnosis not present

## 2017-08-09 DIAGNOSIS — M5412 Radiculopathy, cervical region: Secondary | ICD-10-CM | POA: Diagnosis not present

## 2017-08-09 DIAGNOSIS — R4789 Other speech disturbances: Secondary | ICD-10-CM | POA: Diagnosis not present

## 2017-08-09 HISTORY — DX: Radiculopathy, cervical region: M54.12

## 2017-08-09 MED ORDER — GABAPENTIN 300 MG PO CAPS: 300.0000 mg | ORAL_CAPSULE | Freq: Two times a day (BID) | ORAL | 3 refills | Status: DC

## 2017-08-09 MED ORDER — IBUPROFEN 800 MG PO TABS: 800.0000 mg | ORAL_TABLET | Freq: Three times a day (TID) | ORAL | 0 refills | Status: DC | PRN

## 2017-08-09 NOTE — Telephone Encounter (Signed)
Pt. C/o left forearm pain that radiates up. Reports he was seen in ED 08/03/17 with chest pain and left arm pain. States cardiac and stroke ruled out. Reports he was never told what was causing his arm pain. Started having arm pain again last night. Appointment made after discussing with FC,Sarah. Reason for Disposition . [1] MODERATE pain (e.g., interferes with normal activities) AND [2] present > 3 days  Answer Assessment - Initial Assessment Questions 1. ONSET: "When did the pain start?"     Last week 2. LOCATION: "Where is the pain located?"     Left arm - forearm 3. PAIN: "How bad is the pain?" (Scale 1-10; or mild, moderate, severe)   - MILD (1-3): doesn't interfere with normal activities   - MODERATE (4-7): interferes with normal activities (e.g., work or school) or awakens from sleep   - SEVERE (8-10): excruciating pain, unable to do any normal activities, unable to hold a cup of water     10 4. WORK OR EXERCISE: "Has there been any recent work or exercise that involved this part of the body?"     No 5. CAUSE: "What do you think is causing the arm pain?"     Unsure 6. OTHER SYMPTOMS: "Do you have any other symptoms?" (e.g., neck pain, swelling, rash, fever, numbness, weakness)     No 7. PREGNANCY: "Is there any chance you are pregnant?" "When was your last menstrual period?"     No  Protocols used: ARM PAIN-A-AH

## 2017-08-09 NOTE — Patient Instructions (Addendum)
It was nice to meet you Use ibuprofen and gabapentin as directed PT will call you I will see you back in about 6 weeks  Cervical Radiculopathy Cervical radiculopathy means that a nerve in the neck is pinched or bruised. This can cause pain or loss of feeling (numbness) that runs from your neck to your arm and fingers. Follow these instructions at home: Managing pain  Take over-the-counter and prescription medicines only as told by your doctor.  If directed, put ice on the injured or painful area. ? Put ice in a plastic bag. ? Place a towel between your skin and the bag. ? Leave the ice on for 20 minutes, 2-3 times per day.  If ice does not help, you can try using heat. Take a warm shower or warm bath, or use a heat pack as told by your doctor.  You may try a gentle neck and shoulder massage. Activity  Rest as needed. Follow instructions from your doctor about any activities to avoid.  Do exercises as told by your doctor or physical therapist. General instructions  If you were given a soft collar, wear it as told by your doctor.  Use a flat pillow when you sleep.  Keep all follow-up visits as told by your doctor. This is important. Contact a doctor if:  Your condition does not improve with treatment. Get help right away if:  Your pain gets worse and is not controlled with medicine.  You lose feeling or feel weak in your hand, arm, face, or leg.  You have a fever.  You have a stiff neck.  You cannot control when you poop or pee (have incontinence).  You have trouble with walking, balance, or talking. This information is not intended to replace advice given to you by your health care provider. Make sure you discuss any questions you have with your health care provider. Document Released: 03/25/2011 Document Revised: 09/11/2015 Document Reviewed: 05/30/2014 Elsevier Interactive Patient Education  [employer]2018 Elsevier Inc.

## 2017-08-09 NOTE — Assessment & Plan Note (Addendum)
CT imaging completed in ED show moderated to severe stenosis at C3-C4 on Left which is likely causing his Left arm pain and tingling Will send to PT, avoid heavy lifting for now.  Ibuprofen 800mg  TID PRN Gapapentin 300mg  BID  If not improving with PT will nerve conduction and/or MRI of cervical spine.

## 2017-08-09 NOTE — Telephone Encounter (Signed)
Copied from CRM [id number]. Topic: Quick Communication - See Telephone Encounter >> Aug 09, 2017  4:05 PM Terisa Starraylor, Brittany L wrote: CRM for notification. See Telephone encounter for: 08/09/17.  Patient said that gabapentin (NEURONTIN) 300 MG capsule [facility] [address]. Please advise.

## 2017-08-09 NOTE — Progress Notes (Signed)
Ricky JumboWilliam G Jose - 57 y.o. male MRN [id number]  Date of birth: 12-24-1960  Subjective Chief Complaint  Patient presents with  . New Patient (Initial Visit)    left arm pain/left chest pain    HPI [name] is a 57 y.o. male with a history of type 2 diabetes, Klinefelter syndrome with hypogonadism, GERD, history of prior concussion with subarachnoid hemorrhage here today for hospital follow-up.  He was recently seen in the ED for strokelike symptoms.  Initially presented with left-sided chest pain, left arm numbness and weakness as well as some mild confusion.  MRI was completed showing no sign of stroke and a CT angiogram of the neck showed possible vertebral artery dissection.  It was felt that this was likely due to to healed prior dissection after trauma that he experienced falling from a ladder several years ago and likely represented scar tissue as his symptoms were not consistent with dissection in this area.  CT angiogram of chest was also completed for evaluation of possible aortic dissection given chest pain.  His wife accompanies him today and reports that he continues to have some bilateral lower extremity weakness and she finds that he repeats himself at times and has some difficulty with word finding.  He also complains of continued left arm pain with numbness and tingling.  He is using a walker for ambulation and has been referred to home health physical therapy.  He denies any issues with bowel or bladder incontinence, fever, chills, sob, palpitations, dizziness or headaches.   In regards to his diabetes this is poorly controlled with last A1c of 10.2%.  His metformin was increased prior to discharge from the hospital, however he does not monitor his blood sugars regularly at this time.  He was following a strict diet last year and lost over 40 pounds and got his A1c down to around, 7 however has not been following this over the past several months.  He [facility] and treated with Farxiga in addition to metformin.  He denies vision change related to DM and renal function has been stable.    ROS ROS per HPI, negative unless otherwise noted.   Allergies  Allergen Reactions  . Latex Rash    Past Medical History:  Diagnosis Date  . Allergy   . Diabetes mellitus without complication (HCC)   . Stroke (cerebrum) (HCC) 08/03/2017  . Type 2 diabetes mellitus with hyperlipidemia [facility](HCC)     Past Surgical History:  Procedure Laterality Date  . ESOPHAGOGASTRODUODENOSCOPY (EGD) WITH PROPOFOL N/A 05/09/2015   Procedure: ESOPHAGOGASTRODUODENOSCOPY (EGD) WITH PROPOFOL;  Surgeon: Iva Booparl E Gessner, MD;  Location: [facility];  Service: Endoscopy;  Laterality: N/A;  with esophageal dilation  . LASER ABLATION Left 02/03/15  . VARICOSE VEIN SURGERY      Social History   Socioeconomic History  . Marital status: Divorced    Spouse name: Not on file  . Number of children: Not on file  . Years of education: Not on file  . Highest education level: Not on file  Occupational History  . Not on file  Social Needs  . Financial resource strain: Not on file  . Food insecurity:    Worry: Not on file    Inability: Not on file  . Transportation needs:    Medical: Not on file    Non-medical: Not on file  Tobacco Use  . Smoking status: Never Smoker  . Smokeless tobacco: Never  Used  Substance and Sexual Activity  . Alcohol use: No    Alcohol/week: 0.0 oz  . Drug use: No  . Sexual activity: Never  Lifestyle  . Physical activity:    Days per week: Not on file    Minutes per session: Not on file  . Stress: Not on file  Relationships  . Social connections:    Talks on phone: Not on file    Gets together: Not on file    Attends religious service: Not on file    Active member of club or organization: Not on file    Attends meetings of clubs or organizations: Not on file    Relationship status: Not on file   Other Topics Concern  . Not on file  Social History Narrative  . Not on file    Family History  Problem Relation Age of Onset  . Parkinson's disease Father   . Heart attack Maternal Grandmother     Health Maintenance  Topic Date Due  . Hepatitis C Screening  October 23, 1960  . PNEUMOCOCCAL POLYSACCHARIDE VACCINE (1) 01/13/1961  . FOOT EXAM  01/13/1969  . OPHTHALMOLOGY EXAM  01/13/1969  . URINE MICROALBUMIN  01/13/1969  . TETANUS/TDAP  01/13/1978  . COLONOSCOPY  01/13/2009  . INFLUENZA VACCINE  11/17/2017  . HEMOGLOBIN A1C  02/03/2018  . HIV Screening  Completed    ---------------------------------------------------------------------------------------------------------------------------------------------------- Physical Exam BP 120/80 (BP Location: Right Arm, Patient Position: Sitting, Cuff Size: Normal)   Pulse 89   Ht 6\' 4"  (1.93 m)   Wt 238 lb 3.2 oz (108 kg)   BMI 28.99 kg/m   Physical Exam  Constitutional: He is oriented to person, place, and time. He appears well-nourished. No distress.  HENT:  Head: Normocephalic and atraumatic.  Eyes: No scleral icterus.  Neck: Neck supple.  Cardiovascular: Normal rate, regular rhythm and normal heart sounds.  No murmur heard. Pulmonary/Chest: Effort normal and breath sounds normal. No respiratory distress.  Musculoskeletal:  Neck with FROM. +Spurling sign on L  L shoulder with normal strength and ROM.  Bicipital groove is non-tender.  Negative impingement testing  Lymphadenopathy:    He has no cervical adenopathy.  Neurological: He is alert and oriented to person, place, and time. No cranial nerve deficit.  Gait is somewhat slow but steady  Mild weakness of grip strength on L hand compared to R  Skin: No rash noted.  Psychiatric: He has a normal mood and affect. His behavior is normal.     ----------------------------------------------------------------------------------------------------------------------------------------------------- Assessment and Plan  Weakness of both lower extremities Unclear etiology, will add PT for LE to PT for cervical radiculopath Family history of Parkinson's and Gait is somewhat slow but non-shuffling and no signs of tremor Neuroimaging is unremarkable Will refer to neurology given difficulty with word finding and repetitive speech along with weakness.    Cervical radiculopathy CT imaging completed in ED show moderated to severe stenosis at C3-C4 on Left which is likely causing his Left arm pain and tingling Will send to PT, avoid heavy lifting for now.  Ibuprofen 800mg  TID PRN Gapapentin 300mg  BID  If not improving with PT will nerve conduction and/or MRI of cervical spine.    Hypogonadism in male Follows with urology at [facility].  Is having testosterone pellets placed in May.   Type 2 diabetes mellitus with hyperlipidemia (HCC) Diabetes is poorly controlled.  Stressed importance of compliance to medication and diet- he plans to work on dietary changes. Will refer to endocrinology per his request,  previously followed by [facility] at [facility] Will request records to see if he is up to date on immunizations-he believes he is. Statin recently started in ED, recommend continuation.

## 2017-08-09 NOTE — Assessment & Plan Note (Signed)
Diabetes is poorly controlled.  Stressed importance of compliance to medication and diet- he plans to work on dietary changes. Will refer to endocrinology per his request, [facility] Will request records to see if he is up to date on immunizations-he believes he is. Statin recently started in ED, recommend continuation.

## 2017-08-09 NOTE — Assessment & Plan Note (Signed)
Unclear etiology, will add PT for LE to PT for cervical radiculopath Family history of Parkinson's and Gait is somewhat slow but non-shuffling and no signs of tremor Neuroimaging is unremarkable Will refer to neurology given difficulty with word finding and repetitive speech along with weakness.

## 2017-08-09 NOTE — Telephone Encounter (Signed)
fyi

## 2017-08-09 NOTE — Telephone Encounter (Signed)
Patient scheduled to see Dr. Ashley RoyaltyMatthews

## 2017-08-09 NOTE — Assessment & Plan Note (Signed)
Follows with urology at [facility].  Is having testosterone pellets placed in May.

## 2017-08-10 NOTE — Telephone Encounter (Signed)
Spoke with [facility] located on [address] and pharmacy tech stated that the Gabapentin was transferred to [facility]. Contacted patient to see what pharmacy he would like medication to be sent to since medication has already been transferred. Patient wife stated that they already picked up mediation from [facility] but would like their primary pharmacy to be changed to [facility] on [address] for future medications. Nothing further needed.

## 2017-08-12 DIAGNOSIS — M5412 Radiculopathy, cervical region: Secondary | ICD-10-CM | POA: Diagnosis not present

## 2017-08-15 ENCOUNTER — Encounter

## 2017-08-16 ENCOUNTER — Ambulatory Visit: Payer: BLUE CROSS/BLUE SHIELD | Admitting: Physical Therapy

## 2017-08-17 DIAGNOSIS — M542 Cervicalgia: Secondary | ICD-10-CM | POA: Diagnosis not present

## 2017-08-18 ENCOUNTER — Telehealth: Payer: Self-pay | Admitting: Family Medicine

## 2017-08-18 NOTE — Telephone Encounter (Signed)
Copied from CRM [id number]. Topic: Quick Communication - See Telephone Encounter >> Aug 18, 2017  4:56 PM Rudi Coco, NT wrote: CRM for notification. See Telephone encounter for: 08/18/17.  Pt. Calling to ask does he still need to use the walker? Pt. Would like a call back [phone]

## 2017-08-19 DIAGNOSIS — M5412 Radiculopathy, cervical region: Secondary | ICD-10-CM | POA: Diagnosis not present

## 2017-08-19 NOTE — Telephone Encounter (Signed)
If still feeling unsteady I would recommend that he continue use of this.  Would recommend that he see PT as well.  I had placed this referral but it doesn't appear that he scheduled when they called him.

## 2017-08-19 NOTE — Telephone Encounter (Signed)
Spoke with pt wife, informed her of Dr Ashley Royalty advise to continue to use it if he feels unsteady. She stated they have an appt today with a surgeon for a disc in his neck/back. Will work on PT once that is taken care of. TLG

## 2017-08-19 NOTE — Telephone Encounter (Signed)
Please advise 

## 2017-08-23 DIAGNOSIS — E291 Testicular hypofunction: Secondary | ICD-10-CM | POA: Diagnosis not present

## 2017-08-29 DIAGNOSIS — Z6828 Body mass index (BMI) 28.0-28.9, adult: Secondary | ICD-10-CM | POA: Diagnosis not present

## 2017-08-29 DIAGNOSIS — M5412 Radiculopathy, cervical region: Secondary | ICD-10-CM | POA: Diagnosis not present

## 2017-08-29 DIAGNOSIS — R030 Elevated blood-pressure reading, without diagnosis of hypertension: Secondary | ICD-10-CM | POA: Diagnosis not present

## 2017-09-09 ENCOUNTER — Ambulatory Visit: Payer: Self-pay | Admitting: Neurology

## 2017-09-14 DIAGNOSIS — I69354 Hemiplegia and hemiparesis following cerebral infarction affecting left non-dominant side: Secondary | ICD-10-CM | POA: Diagnosis not present

## 2017-09-14 DIAGNOSIS — J45909 Unspecified asthma, uncomplicated: Secondary | ICD-10-CM | POA: Diagnosis not present

## 2017-09-14 DIAGNOSIS — E119 Type 2 diabetes mellitus without complications: Secondary | ICD-10-CM | POA: Diagnosis not present

## 2017-09-14 DIAGNOSIS — H538 Other visual disturbances: Secondary | ICD-10-CM | POA: Diagnosis not present

## 2017-09-16 DIAGNOSIS — M5412 Radiculopathy, cervical region: Secondary | ICD-10-CM | POA: Diagnosis not present

## 2017-09-16 DIAGNOSIS — M4802 Spinal stenosis, cervical region: Secondary | ICD-10-CM | POA: Diagnosis not present

## 2017-09-17 ENCOUNTER — Other Ambulatory Visit: Payer: Self-pay

## 2017-09-17 ENCOUNTER — Emergency Department (HOSPITAL_COMMUNITY): Payer: BLUE CROSS/BLUE SHIELD

## 2017-09-17 ENCOUNTER — Emergency Department (HOSPITAL_COMMUNITY)
Admission: EM | Admit: 2017-09-17 | Discharge: 2017-09-17 | Disposition: A | Discharge status: Home / Self Care | Payer: BLUE CROSS/BLUE SHIELD | Attending: Emergency Medicine | Admitting: Emergency Medicine

## 2017-09-17 DIAGNOSIS — Z7984 Long term (current) use of oral hypoglycemic drugs: Secondary | ICD-10-CM | POA: Insufficient documentation

## 2017-09-17 DIAGNOSIS — R0602 Shortness of breath: Secondary | ICD-10-CM | POA: Diagnosis not present

## 2017-09-17 DIAGNOSIS — R0902 Hypoxemia: Secondary | ICD-10-CM | POA: Diagnosis not present

## 2017-09-17 DIAGNOSIS — E119 Type 2 diabetes mellitus without complications: Secondary | ICD-10-CM | POA: Diagnosis not present

## 2017-09-17 DIAGNOSIS — R0600 Dyspnea, unspecified: Secondary | ICD-10-CM | POA: Diagnosis not present

## 2017-09-17 DIAGNOSIS — R5082 Postprocedural fever: Secondary | ICD-10-CM | POA: Diagnosis not present

## 2017-09-17 DIAGNOSIS — R05 Cough: Secondary | ICD-10-CM | POA: Diagnosis not present

## 2017-09-17 DIAGNOSIS — Z79899 Other long term (current) drug therapy: Secondary | ICD-10-CM | POA: Insufficient documentation

## 2017-09-17 DIAGNOSIS — R490 Dysphonia: Secondary | ICD-10-CM | POA: Insufficient documentation

## 2017-09-17 DIAGNOSIS — R509 Fever, unspecified: Secondary | ICD-10-CM | POA: Diagnosis not present

## 2017-09-17 DIAGNOSIS — E1165 Type 2 diabetes mellitus with hyperglycemia: Secondary | ICD-10-CM | POA: Diagnosis not present

## 2017-09-17 LAB — CBC WITH DIFFERENTIAL/PLATELET
BASOS PCT: 0 %
Basophils Absolute: 0 10*3/uL (ref 0.0–0.1)
Eosinophils Absolute: 0 10*3/uL (ref 0.0–0.7)
Eosinophils Relative: 0 %
HEMATOCRIT: 45.4 % (ref 39.0–52.0)
Hemoglobin: 15.5 g/dL (ref 13.0–17.0)
Lymphocytes Relative: 20 %
Lymphs Abs: 1.7 10*3/uL (ref 0.7–4.0)
MCH: 31.4 pg (ref 26.0–34.0)
MCHC: 34.1 g/dL (ref 30.0–36.0)
MCV: 92.1 fL (ref 78.0–100.0)
MONO ABS: 0.7 10*3/uL (ref 0.1–1.0)
MONOS PCT: 8 %
NEUTROS ABS: 6 10*3/uL (ref 1.7–7.7)
Neutrophils Relative %: 72 %
Platelets: 150 10*3/uL (ref 150–400)
RBC: 4.93 MIL/uL (ref 4.22–5.81)
RDW: 13.6 % (ref 11.5–15.5)
WBC: 8.4 10*3/uL (ref 4.0–10.5)

## 2017-09-17 LAB — BASIC METABOLIC PANEL
Anion gap: 8 (ref 5–15)
BUN: 11 mg/dL (ref 6–20)
CO2: 28 mmol/L (ref 22–32)
CREATININE: 0.91 mg/dL (ref 0.61–1.24)
Calcium: 8.2 mg/dL — ABNORMAL LOW (ref 8.9–10.3)
Chloride: 99 mmol/L — ABNORMAL LOW (ref 101–111)
GFR calc Af Amer: >60 mL/min (ref >60)
GFR calc non Af Amer: >60 mL/min (ref >60)
GLUCOSE: 260 mg/dL — AB (ref 65–99)
Potassium: 4.3 mmol/L (ref 3.5–5.1)
Sodium: 135 mmol/L (ref 135–145)

## 2017-09-17 LAB — I-STAT CG4 LACTIC ACID, ED: Lactic Acid, Venous: 1.02 mmol/L (ref 0.5–1.9)

## 2017-09-17 MED ORDER — AZITHROMYCIN 250 MG PO TABS: 250.0000 mg | ORAL_TABLET | Freq: Every day | ORAL | 0 refills | Status: DC

## 2017-09-17 MED ORDER — SODIUM CHLORIDE 0.9 % IV SOLN: 1.0000 g | Freq: Once | INTRAVENOUS | Status: DC

## 2017-09-17 MED ORDER — SODIUM CHLORIDE 0.9 % IV BOLUS
500.0000 mL | Freq: Once | INTRAVENOUS | Status: AC
  Administered 2017-09-17: 500 mL via INTRAVENOUS

## 2017-09-17 MED ORDER — IOPAMIDOL (ISOVUE-370) INJECTION 76%
100.0000 mL | Freq: Once | INTRAVENOUS | Status: AC | PRN
  Administered 2017-09-17: 100 mL via INTRAVENOUS

## 2017-09-17 MED ORDER — AZITHROMYCIN 250 MG PO TABS
500.0000 mg | ORAL_TABLET | Freq: Once | ORAL | Status: AC
  Administered 2017-09-17: 500 mg via ORAL
  Filled 2017-09-17: qty 2

## 2017-09-17 NOTE — ED Notes (Signed)
Pt ambulated in the hall without O2 and his O2 level dropped to 92%

## 2017-09-17 NOTE — ED Notes (Signed)
Bed: WA20 Expected date:  Expected time:  Means of arrival:  Comments: 57 yo SOB, chest pain

## 2017-09-17 NOTE — ED Triage Notes (Signed)
Per EMS, patient comes from urgent care with SOB. Yesterday he had a C7 disc replacement surgery. In PACU he had SOB, hoarse voice, spre throat, and middle chest pain. Gradually worse since then. Pt went to urgent care and sat 82-85%. He is now on 3L and sat 97%. Sats drop when speaking. CXR today at urgent care showed infiltrate in RLL, paperwork at bedside. Fever 101. Rocephin 1g IM and 600 motrin given. CBG 275. 18g L AC  Redness on chest is from a SE of testosterone.  Side note from EMS: Pt took 200mg  metropolol of his moms this AM, thinking it was his metformin. VSS.

## 2017-09-17 NOTE — ED Notes (Signed)
Patient transported to CT 

## 2017-09-17 NOTE — Discharge Instructions (Signed)
Please read and follow all provided instructions.  Your diagnoses today include:  1. Postoperative fever   2. Shortness of breath     Tests performed today include:  Blood counts and electrolytes  CT of your chest - shows no definite pneumonia and no blood clots  Vital signs. See below for your results today.   Medications prescribed:   Azithromycin - antibiotic for respiratory infection  You have been prescribed an antibiotic medicine: take the entire course of medicine even if you are feeling better. Stopping early can cause the antibiotic not to work.  Take any prescribed medications only as directed.  Home care instructions:  Follow any educational materials contained in this packet.  Use home and incentive spirometer 10 times every hour while awake.  BE VERY CAREFUL not to take multiple medicines containing Tylenol (also called acetaminophen). Doing so can lead to an overdose which can damage your liver and cause liver failure and possibly death.   Follow-up instructions: Please follow-up with your neurosurgeon in the next 2-3 days for further evaluation of your symptoms.   Return instructions:   Please return to the Emergency Department if you experience worsening symptoms.   Return with worsening shortness of breath, chest pain, persistent fever.  Please return if you have any other emergent concerns.  Additional Information:  Your vital signs today were: BP 116/81    Pulse 78    Temp 99.4 F (37.4 C) (Oral)    Resp 17    Ht 6\' 3"  (1.905 m)    Wt 106.6 kg (235 lb)    SpO2 91%    BMI 29.37 kg/m  If your blood pressure (BP) was elevated above 135/85 this visit, please have this repeated by your doctor within one month. --------------

## 2017-09-17 NOTE — ED Provider Notes (Signed)
[facility]-EMERGENCY DEPT Provider Note   CSN: [id number] Arrival date & time: 09/17/17  1559     History   Chief Complaint Chief Complaint  Patient presents with  . Shortness of Breath  . Post-op Problem    HPI [name] is a 57 y.o. male.  Patient with history of diabetes, cervical radiculopathy status post cervical fixation performed as an outpatient yesterday --presents with complaint of fever starting last night.  Patient reports having a sore throat and hoarse voice after his surgery yesterday.  When he developed fever last night he started taking Tylenol.  Fever persisted this morning so he was directed to get an urgent care where he was found to have a multifocal pneumonia per family report (RLL infiltrate per records at bedside).  Patient was found to be hypoxic into the low 80% range.  Fever has been as high as 101 F.  Patient was given Rocephin 1g IM and Motrin for fever.  EMS was called due to hypoxia and patient is now more comfortable on 2 L.  No history of DVT or PE.  Of note, patient excellently took 200 mg of metoprolol today instead of metformin by accident. The onset of this condition was acute. The course is constant. Aggravating factors: none. Alleviating factors: none.       Past Medical History:  Diagnosis Date  . Allergy   . Cervical radiculopathy 08/09/2017  . Diabetes mellitus without complication (HCC)   . Stroke (cerebrum) (HCC) 08/03/2017  . Type 2 diabetes mellitus with hyperlipidemia [facility](HCC)     Patient Active Problem List   Diagnosis Date Noted  . Cervical radiculopathy 08/09/2017  . Weakness of both lower extremities 08/09/2017  . 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 Stroke-like symptoms 08/03/2017  . Chest pain   . Lumbar spondylosis 12/02/2016  . Dysphagia   . Diabetes mellitus without complication (HCC) 05/07/2015  . Varicose veins of lower extremities with complications 09/09/2014  . Brain  contusion (HCC) 09/21/2012  . Subarachnoid hemorrhage (HCC) 09/21/2012  . Type 2 diabetes mellitus with hyperlipidemia (HCC) 09/21/2012  . Concussion with loss of consciousness 09/21/2012  . OCCUPATIONAL ASTHMA 07/16/2007  . SWELLING, MASS, OR LUMP IN CHEST 07/16/2007  . Hypogonadism in male 05/10/2007  . Esophageal reflux 05/10/2007  . [facility]    Past Surgical History:  Procedure Laterality Date  . ESOPHAGOGASTRODUODENOSCOPY (EGD) WITH PROPOFOL N/A 05/09/2015   Procedure: ESOPHAGOGASTRODUODENOSCOPY (EGD) WITH PROPOFOL;  Surgeon: Iva Booparl E Gessner, MD;  Location: [facility] ENDOSCOPY;  Service: Endoscopy;  Laterality: N/A;  with esophageal dilation  . LASER ABLATION Left 02/03/15  . VARICOSE VEIN SURGERY          Home Medications    Prior to Admission medications   Medication Sig Start Date End Date Taking? Authorizing Provider  albuterol (PROVENTIL HFA;VENTOLIN HFA) 108 (90 Base) MCG/ACT inhaler Inhale 2 puffs into the lungs every 6 (six) hours as needed for wheezing or shortness of breath. 08/04/17   Hollice EspyKrishnan, Sendil K, MD  aspirin EC 81 MG tablet Take 1 tablet (81 mg total) by mouth daily. 08/04/17   Hollice EspyKrishnan, Sendil K, MD  atorvastatin (LIPITOR) 40 MG tablet Take 1 tablet (40 mg total) by mouth daily at 6 PM. 08/04/17   Hollice EspyKrishnan, Sendil K, MD  gabapentin (NEURONTIN) 300 MG capsule Take 1 capsule (300 mg total) by mouth 2 (two) times daily. 08/09/17   Everrett CoombeMatthews, Cody, DO  ibuprofen (ADVIL,MOTRIN) 800 MG tablet Take 1 tablet (  800 mg total) by mouth every 8 (eight) hours as needed. 08/09/17   Everrett Coombe, DO  metFORMIN (GLUMETZA) 1000 MG (MOD) 24 hr tablet Take 1 tablet (1,000 mg total) by mouth daily with breakfast. 08/04/17   Hollice Espy, MD  Omega-3 Fatty Acids (FISH OIL) 1000 MG CAPS Take by mouth. Liquid form    [provider]  Testosterone (ANDROGEL) 20.25 MG/1.25GM (1.62%) GEL Place 2 application onto the skin daily. 2 pumps on each thigh daily     [provider]    Family History Family History  Problem Relation Age of Onset  . Parkinson's disease Father   . Heart attack Maternal Grandmother     Social History Social History   Tobacco Use  . Smoking status: Never Smoker  . Smokeless tobacco: Never Used  Substance Use Topics  . Alcohol use: No    Alcohol/week: 0.0 oz  . Drug use: No     Allergies   Latex   Review of Systems Review of Systems  Constitutional: Positive for fever.  HENT: Positive for sore throat and voice change. Negative for rhinorrhea.   Eyes: Negative for redness.  Respiratory: Positive for shortness of breath. Negative for cough.   Cardiovascular: Negative for chest pain.  Gastrointestinal: Negative for abdominal pain, diarrhea, nausea and vomiting.  Genitourinary: Negative for dysuria.  Musculoskeletal: Negative for myalgias.  Skin: Negative for rash.  Neurological: Negative for headaches.     Physical Exam Updated Vital Signs BP 115/71 (BP Location: Right Arm)   Pulse 85   Temp 99.4 F (37.4 C) (Oral)   Resp 18   Ht 6\' 3"  (1.905 m)   Wt 106.6 kg (235 lb)   SpO2 96% Comment: 3L  BMI 29.37 kg/m   Physical Exam  Constitutional: He appears well-developed and well-nourished.  HENT:  Head: Normocephalic and atraumatic.  Mouth/Throat: Oropharynx is clear and moist.  Eyes: Conjunctivae are normal. Right eye exhibits no discharge. Left eye exhibits no discharge.  Neck: Normal range of motion. Neck supple.  Cardiovascular: Normal rate, regular rhythm and normal heart sounds.  Pulmonary/Chest: Effort normal and breath sounds normal. He has no decreased breath sounds. He has no wheezes. He has no rales.  Abdominal: Soft. There is no tenderness.  Musculoskeletal:       Right lower leg: He exhibits no tenderness and no edema.       Left lower leg: He exhibits no tenderness and no edema.  Neurological: He is alert.  Skin: Skin is warm and dry.  Right anterior neck surgical  dressing in place.  Dressing is clean.  Psychiatric: He has a normal mood and affect.  Nursing note and vitals reviewed.    ED Treatments / Results  Labs (all labs ordered are listed, but only abnormal results are displayed) Labs Reviewed  BASIC METABOLIC PANEL - Abnormal; Notable for the following components:      Result Value   Chloride 99 (*)    Glucose, Bld 260 (*)    Calcium 8.2 (*)    All other components within normal limits  CBC WITH DIFFERENTIAL/PLATELET  I-STAT CG4 LACTIC ACID, ED    EKG EKG Interpretation  Date/Time:  Saturday September 17 2017 16:18:19 EDT Ventricular Rate:  83 PR Interval:    QRS Duration: 106 QT Interval:  355 QTC Calculation: 418 R Axis:   -70 Text Interpretation:  Sinus rhythm Atrial premature complex Confirmed by Cathren Laine ([id number]) on 09/17/2017 5:34:55 PM   Radiology Ct Angio  Chest Pe W And/or Wo Contrast  Result Date: 09/17/2017 CLINICAL DATA:  Dyspnea. Reported history of lower cervical spine surgery 1 day prior. EXAM: CT ANGIOGRAPHY CHEST WITH CONTRAST TECHNIQUE: Multidetector CT imaging of the chest was performed using the standard protocol during bolus administration of intravenous contrast. Multiplanar CT image reconstructions and MIPs were obtained to evaluate the vascular anatomy. CONTRAST:  ISOVUE-370 IOPAMIDOL (ISOVUE-370) INJECTION 76% COMPARISON:  08/03/2017 chest CT angiogram. FINDINGS: Cardiovascular: The study is high quality for the evaluation of pulmonary embolism. There are no filling defects in the central, lobar, segmental or subsegmental pulmonary artery branches to suggest acute pulmonary embolism. Normal course and caliber of the thoracic aorta. Top-normal caliber main pulmonary artery (3.2 cm diameter). Normal heart size. No significant pericardial fluid/thickening. Mediastinum/Nodes: No discrete thyroid nodules. Unremarkable esophagus. No pathologically enlarged axillary, mediastinal or hilar lymph nodes. Lungs/Pleura:  No pneumothorax. No pleural effusion. Mild-to-moderate bilateral lower lobe atelectasis. No acute consolidative airspace disease, lung masses or significant pulmonary nodules in the aerated portions of the lungs. Upper abdomen: Colonic diverticulosis. Musculoskeletal: No aggressive appearing focal osseous lesions. Partially visualized postsurgical changes from ACDF in the lower cervical spine. There is scattered soft tissue gas and fat stranding in the central/prevertebral lower neck. Moderate thoracic spondylosis. No focal fluid collections. Review of the MIP images confirms the above findings. IMPRESSION: 1. No pulmonary embolism. 2. Mild-to-moderate bilateral lower lobe atelectasis. 3. Scattered soft tissue gas and fat stranding in the central/prevertebral lower neck, presumably due to postsurgical changes from recent lower cervical spine ACDF. No focal fluid collections. Electronically Signed   By: Delbert Phenix M.D.   On: 09/17/2017 20:20    Procedures Procedures (including critical care time)  Medications Ordered in ED Medications  azithromycin (ZITHROMAX) tablet 500 mg (500 mg Oral Given 09/17/17 1704)  sodium chloride 0.9 % bolus 500 mL (0 mLs Intravenous Stopped 09/17/17 1934)  iopamidol (ISOVUE-370) 76 % injection 100 mL (100 mLs Intravenous Contrast Given 09/17/17 1946)     Initial Impression / Assessment and Plan / ED Course  I have reviewed the triage vital signs and the nursing notes.  Pertinent labs & imaging results that were available during my care of the patient were reviewed by me and considered in my medical decision making (see chart for details).     Patient seen and examined. Work-up initiated. Medications ordered.   Vital signs reviewed and are as follows: BP 115/71 (BP Location: Right Arm)   Pulse 85   Temp 99.4 F (37.4 C) (Oral)   Resp 18   Ht 6\' 3"  (1.905 m)   Wt 106.6 kg (235 lb)   SpO2 96% Comment: 3L  BMI 29.37 kg/m   Patient received Rocephin prior to  arrival, oral azithromycin ordered.  Will check lab work and EKG.  Given new hypoxia in setting of recent surgery, will rule out pulmonary embolism.  9:39 PM CT reviewed by myself.  Patient with out signs of pulmonary embolism.  No definite pneumonia and chest x-ray findings from before are most likely atelectasis.  Lab work is normal.  No fever here.  Patient has ambulated and maintained oxygen saturation above 92%. He is low 90's on RA in room now.  Patient did not feel short of breath with ambulation.  He is comfortable discharged home at this time.  Will give incentive spirometer for home.  Encouraged to use 10 times every hour while awake.  Will cover with azithromycin; will treat for 4 additional days.  Encouraged  to call to his surgeon in 2 days for recheck.  Return with worsening shortness of breath, persistent fever, worsening pain, new symptoms or other concerns.  Final Clinical Impressions(s) / ED Diagnoses   Final diagnoses:  Postoperative fever  Shortness of breath   Patient with postoperative fever.  Initial concern for pneumonia.  Chest CT performed to better delineate and rule out pulmonary embolism.  No PE noted.  Patient found to have likely atelectasis.  Patient's labs are reassuring.  Patient is now maintaining oxygen saturation with activity above 92%.  He is not excessively short of breath.  He wants to go home.  Feel this is appropriate given his current state.  We will discharged home with incentive spirometer and close follow-up with his surgeon.  Return instructions as above.  EKG without ischemic changes.  Patient took metoprolol accidentally today.  He has not been bradycardic or hypotensive in the emergency department.  He is not on blood pressure medications at home.  He is not symptomatic from this.  ED Discharge Orders        Ordered    azithromycin (ZITHROMAX) 250 MG tablet  Daily     09/17/17 2114       Renne Crigler, PA-C 09/17/17 2142    Cathren Laine, MD 09/17/17 2255

## 2017-09-20 ENCOUNTER — Ambulatory Visit: Payer: Self-pay | Admitting: Endocrinology

## 2017-09-22 ENCOUNTER — Other Ambulatory Visit: Payer: Self-pay | Admitting: Neurological Surgery

## 2017-09-22 ENCOUNTER — Ambulatory Visit
Admission: RE | Admit: 2017-09-22 | Discharge: 2017-09-22 | Disposition: A | Discharge status: Home / Self Care | Payer: BLUE CROSS/BLUE SHIELD | Source: Ambulatory Visit | Attending: Neurological Surgery | Admitting: Neurological Surgery

## 2017-09-22 DIAGNOSIS — M542 Cervicalgia: Secondary | ICD-10-CM

## 2017-09-22 DIAGNOSIS — M4322 Fusion of spine, cervical region: Secondary | ICD-10-CM | POA: Diagnosis not present

## 2017-10-03 ENCOUNTER — Telehealth: Payer: Self-pay | Admitting: Surgery

## 2017-10-03 NOTE — Telephone Encounter (Signed)
Addendum 08/09/17 2nd shift W. Aundria Rudogers RN BSN ED NCM CM received handoff from Daytime ED CM. CM reviewed record. CM contacted patient to  discussed record [facility], offered choice patient stated no preference of HH agency. Dominic Peaheryl [facility] liaison contact confirmed they will be able to accept referral and will follow up with patient. No further ED CM needs

## 2017-10-05 DIAGNOSIS — E291 Testicular hypofunction: Secondary | ICD-10-CM | POA: Diagnosis not present

## 2017-10-13 DIAGNOSIS — E119 Type 2 diabetes mellitus without complications: Secondary | ICD-10-CM | POA: Diagnosis not present

## 2017-10-13 DIAGNOSIS — I69354 Hemiplegia and hemiparesis following cerebral infarction affecting left non-dominant side: Secondary | ICD-10-CM | POA: Diagnosis not present

## 2017-10-13 DIAGNOSIS — H538 Other visual disturbances: Secondary | ICD-10-CM | POA: Diagnosis not present

## 2017-10-13 DIAGNOSIS — I69398 Other sequelae of cerebral infarction: Secondary | ICD-10-CM | POA: Diagnosis not present

## 2017-11-28 DIAGNOSIS — E291 Testicular hypofunction: Secondary | ICD-10-CM | POA: Diagnosis not present

## 2017-12-07 ENCOUNTER — Ambulatory Visit: Payer: Self-pay | Admitting: Endocrinology

## 2018-01-02 DIAGNOSIS — E291 Testicular hypofunction: Secondary | ICD-10-CM | POA: Diagnosis not present

## 2018-01-11 DIAGNOSIS — E291 Testicular hypofunction: Secondary | ICD-10-CM | POA: Diagnosis not present

## 2018-02-13 DIAGNOSIS — M542 Cervicalgia: Secondary | ICD-10-CM | POA: Diagnosis not present

## 2018-02-13 DIAGNOSIS — R29898 Other symptoms and signs involving the musculoskeletal system: Secondary | ICD-10-CM | POA: Diagnosis not present

## 2018-02-27 DIAGNOSIS — G5603 Carpal tunnel syndrome, bilateral upper limbs: Secondary | ICD-10-CM | POA: Diagnosis not present

## 2018-02-28 DIAGNOSIS — G5603 Carpal tunnel syndrome, bilateral upper limbs: Secondary | ICD-10-CM | POA: Diagnosis not present

## 2018-03-03 ENCOUNTER — Encounter: Payer: Self-pay | Admitting: Family Medicine

## 2018-03-03 DIAGNOSIS — E291 Testicular hypofunction: Secondary | ICD-10-CM | POA: Diagnosis not present

## 2018-03-03 DIAGNOSIS — R972 Elevated prostate specific antigen [PSA]: Secondary | ICD-10-CM | POA: Diagnosis not present

## 2018-03-06 ENCOUNTER — Other Ambulatory Visit: Payer: Self-pay | Admitting: Neurological Surgery

## 2018-03-13 ENCOUNTER — Encounter (HOSPITAL_COMMUNITY): Admission: RE | Payer: Self-pay | Source: Ambulatory Visit

## 2018-03-13 ENCOUNTER — Inpatient Hospital Stay (HOSPITAL_COMMUNITY)
Admission: RE | Admit: 2018-03-13 | Payer: BLUE CROSS/BLUE SHIELD | Source: Ambulatory Visit | Admitting: Neurological Surgery

## 2018-03-13 SURGERY — CARPAL TUNNEL RELEASE
Anesthesia: Monitor Anesthesia Care | Laterality: Right

## 2018-03-30 DIAGNOSIS — H5203 Hypermetropia, bilateral: Secondary | ICD-10-CM | POA: Diagnosis not present

## 2018-03-31 ENCOUNTER — Encounter (HOSPITAL_COMMUNITY): Admission: RE | Payer: Self-pay | Source: Ambulatory Visit

## 2018-03-31 ENCOUNTER — Ambulatory Visit (HOSPITAL_COMMUNITY)
Admission: RE | Admit: 2018-03-31 | Payer: BLUE CROSS/BLUE SHIELD | Source: Ambulatory Visit | Admitting: Neurological Surgery

## 2018-03-31 SURGERY — CARPAL TUNNEL RELEASE
Anesthesia: Monitor Anesthesia Care | Laterality: Right

## 2018-04-03 DIAGNOSIS — E291 Testicular hypofunction: Secondary | ICD-10-CM | POA: Diagnosis not present

## 2018-04-06 ENCOUNTER — Encounter: Payer: Self-pay | Admitting: Family Medicine

## 2018-04-06 ENCOUNTER — Ambulatory Visit: Payer: BLUE CROSS/BLUE SHIELD | Admitting: Family Medicine

## 2018-04-06 ENCOUNTER — Ambulatory Visit: Payer: Self-pay | Admitting: Family Medicine

## 2018-04-06 VITALS — BP 138/74 | HR 83 | Temp 98.3°F | Ht 75.0 in | Wt 227.0 lb

## 2018-04-06 DIAGNOSIS — Z1211 Encounter for screening for malignant neoplasm of colon: Secondary | ICD-10-CM | POA: Diagnosis not present

## 2018-04-06 DIAGNOSIS — R1319 Other dysphagia: Secondary | ICD-10-CM

## 2018-04-06 DIAGNOSIS — E785 Hyperlipidemia, unspecified: Secondary | ICD-10-CM

## 2018-04-06 DIAGNOSIS — R1310 Dysphagia, unspecified: Secondary | ICD-10-CM

## 2018-04-06 DIAGNOSIS — Z23 Encounter for immunization: Secondary | ICD-10-CM

## 2018-04-06 DIAGNOSIS — E1169 Type 2 diabetes mellitus with other specified complication: Secondary | ICD-10-CM

## 2018-04-06 DIAGNOSIS — E119 Type 2 diabetes mellitus without complications: Secondary | ICD-10-CM | POA: Diagnosis not present

## 2018-04-06 NOTE — Patient Instructions (Signed)
Diabetes Basics    Diabetes (diabetes mellitus) is a long-term (chronic) disease. It occurs when the body does not properly use sugar (glucose) that is released from food after you eat.  Diabetes may be caused by one or both of these problems:  · Your pancreas does not make enough of a hormone called insulin.  · Your body does not react in a normal way to insulin that it makes.  Insulin lets sugars (glucose) go into cells in your body. This gives you energy. If you have diabetes, sugars cannot get into cells. This causes high blood sugar (hyperglycemia).  Follow these instructions at home:  How is diabetes treated?  You may need to take insulin or other diabetes medicines daily to keep your blood sugar in balance. Take your diabetes medicines every day as told by your doctor. List your diabetes medicines here:  Diabetes medicines  · Name of medicine: ______________________________  ? Amount (dose): _______________ Time (a.m./p.m.): _______________ Notes: ___________________________________  · Name of medicine: ______________________________  ? Amount (dose): _______________ Time (a.m./p.m.): _______________ Notes: ___________________________________  · Name of medicine: ______________________________  ? Amount (dose): _______________ Time (a.m./p.m.): _______________ Notes: ___________________________________  If you use insulin, you will learn how to give yourself insulin by injection. You may need to adjust the amount based on the food that you eat. List the types of insulin you use here:  Insulin  · Insulin type: ______________________________  ? Amount (dose): _______________ Time (a.m./p.m.): _______________ Notes: ___________________________________  · Insulin type: ______________________________  ? Amount (dose): _______________ Time (a.m./p.m.): _______________ Notes: ___________________________________  · Insulin type: ______________________________  ? Amount (dose): _______________ Time (a.m./p.m.):  _______________ Notes: ___________________________________  · Insulin type: ______________________________  ? Amount (dose): _______________ Time (a.m./p.m.): _______________ Notes: ___________________________________  · Insulin type: ______________________________  ? Amount (dose): _______________ Time (a.m./p.m.): _______________ Notes: ___________________________________  How do I manage my blood sugar?    Check your blood sugar levels using a blood glucose monitor as directed by your doctor.  Your doctor will set treatment goals for you. Generally, you should have these blood sugar levels:  · Before meals (preprandial): 80-130 mg/dL (4.4-7.2 mmol/L).  · After meals (postprandial): below 180 mg/dL (10 mmol/L).  · A1c level: less than 7%.  Write down the times that you will check your blood sugar levels:  Blood sugar checks  · Time: _______________ Notes: ___________________________________  · Time: _______________ Notes: ___________________________________  · Time: _______________ Notes: ___________________________________  · Time: _______________ Notes: ___________________________________  · Time: _______________ Notes: ___________________________________  · Time: _______________ Notes: ___________________________________    What do I need to know about low blood sugar?  Low blood sugar is called hypoglycemia. This is when blood sugar is at or below 70 mg/dL (3.9 mmol/L). Symptoms may include:  · Feeling:  ? Hungry.  ? Worried or nervous (anxious).  ? Sweaty and clammy.  ? Confused.  ? Dizzy.  ? Sleepy.  ? Sick to your stomach (nauseous).  · Having:  ? A fast heartbeat.  ? A headache.  ? A change in your vision.  ? Tingling or no feeling (numbness) around the mouth, lips, or tongue.  ? Jerky movements that you cannot control (seizure).  · Having trouble with:  ? Moving (coordination).  ? Sleeping.  ? Passing out (fainting).  ? Getting upset easily (irritability).  Treating low blood sugar  To treat low blood  sugar, eat or drink something sugary right away. If you can think clearly and swallow safely, follow the 15:15   rule:  · Take 15 grams of a fast-acting carb (carbohydrate). Talk with your doctor about how much you should take.  · Some fast-acting carbs are:  ? Sugar tablets (glucose pills). Take 3-4 glucose pills.  ? 6-8 pieces of hard candy.  ? 4-6 oz (120-150 mL) of fruit juice.  ? 4-6 oz (120-150 mL) of regular (not diet) soda.  ? 1 Tbsp (15 mL) honey or sugar.  · Check your blood sugar 15 minutes after you take the carb.  · If your blood sugar is still at or below 70 mg/dL (3.9 mmol/L), take 15 grams of a carb again.  · If your blood sugar does not go above 70 mg/dL (3.9 mmol/L) after 3 tries, get help right away.  · After your blood sugar goes back to normal, eat a meal or a snack within 1 hour.  Treating very low blood sugar  If your blood sugar is at or below 54 mg/dL (3 mmol/L), you have very low blood sugar (severe hypoglycemia). This is an emergency. Do not wait to see if the symptoms will go away. Get medical help right away. Call your local emergency services (911 in the [place] Do not drive yourself to the hospital.  Questions to ask your health care provider  · Do I need to meet with a diabetes educator?  · What equipment will I need to care for myself at home?  · What diabetes medicines do I need? When should I take them?  · How often do I need to check my blood sugar?  · What number can I call if I have questions?  · When is my next doctor's visit?  · Where can I find a support group for people with diabetes?  Where to find more information  · American Diabetes Association: www.diabetes.org  · American Association of Diabetes Educators: www.diabeteseducator.org/patient-resources  Contact a doctor if:  · Your blood sugar is at or above 240 mg/dL (13.3 mmol/L) for 2 days in a row.  · You have been sick or have had a fever for 2 days or more, and you are not getting better.  · You have any of these  problems for more than 6 hours:  ? You cannot eat or drink.  ? You feel sick to your stomach (nauseous).  ? You throw up (vomit).  ? You have watery poop (diarrhea).  Get help right away if:  · Your blood sugar is lower than 54 mg/dL (3 mmol/L).  · You get confused.  · You have trouble:  ? Thinking clearly.  ? Breathing.  Summary  · Diabetes (diabetes mellitus) is a long-term (chronic) disease. It occurs when the body does not properly use sugar (glucose) that is released from food after digestion.  · Take insulin and diabetes medicines as told.  · Check your blood sugar every day, as often as told.  · Keep all follow-up visits as told by your doctor. This is important.  This information is not intended to replace advice given to you by your health care provider. Make sure you discuss any questions you have with your health care provider.  Document Released: 07/08/2017 Document Revised: 09/26/2017 Document Reviewed: 07/08/2017  Elsevier Interactive Patient Education © 2019 [employer]

## 2018-04-07 ENCOUNTER — Encounter: Payer: Self-pay | Admitting: Family Medicine

## 2018-04-07 LAB — BASIC METABOLIC PANEL
BUN: 13 mg/dL (ref 6–23)
CHLORIDE: 104 meq/L (ref 96–112)
CO2: 26 mEq/L (ref 19–32)
CREATININE: 0.87 mg/dL (ref 0.40–1.50)
Calcium: 9.6 mg/dL (ref 8.4–10.5)
GFR: 95.39 mL/min (ref >60.00)
GLUCOSE: 124 mg/dL — AB (ref 70–99)
Potassium: 3.8 mEq/L (ref 3.5–5.1)
Sodium: 139 mEq/L (ref 135–145)

## 2018-04-07 LAB — MICROALBUMIN / CREATININE URINE RATIO
Creatinine,U: 37.8 mg/dL
MICROALB UR: 5.8 mg/dL — AB (ref 0.0–1.9)
Microalb Creat Ratio: 15.4 mg/g (ref 0.0–30.0)

## 2018-04-07 LAB — HEMOGLOBIN A1C: Hgb A1c MFr Bld: 9.6 % — ABNORMAL HIGH (ref 4.6–6.5)

## 2018-04-07 NOTE — Assessment & Plan Note (Signed)
-  Referral place to GI, requesitng Dr. Jenean [place]

## 2018-04-07 NOTE — Progress Notes (Signed)
Ricky JumboWilliam G Voong - 57 y.o. male MRN [id number]  Date of birth: [birth year]  Subjective Chief Complaint  Patient presents with  . Follow-up    HPI  [name] is a 57 y.o. male with history of T2DM and androgen deficiency here today for follow up of diabetes.  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  He has been working on changes to his diet since that time and hopes blood sugars have improved.  He was referred to endocrinology previously as well for mgmt of his diabetes however he missed this appt due to traveling for work.   He is interested in being referred back for mgmt of diabetes.  He does report that he had eye exam with Dr. Silverio Laye-Allen in [place], [place] 2 weeks ago and this was normal.   He is also due for colon cancer screening.  He is also having some dysphagia and has required his esophagus stretched in the past.   ROS:  A comprehensive ROS was completed and negative except as noted per HPI  Allergies  Allergen Reactions  . Latex Rash    Past Medical History:  Diagnosis Date  . Allergy   . Cervical radiculopathy 08/09/2017  . Diabetes mellitus without complication (HCC)   . Stroke (cerebrum) (HCC) 08/03/2017  . Type 2 diabetes mellitus with hyperlipidemia [facility](HCC)     Past Surgical History:  Procedure Laterality Date  . ESOPHAGOGASTRODUODENOSCOPY (EGD) WITH PROPOFOL N/A 05/09/2015   Procedure: ESOPHAGOGASTRODUODENOSCOPY (EGD) WITH PROPOFOL;  Surgeon: Iva Booparl E Gessner, MD;  Location: [facility];  Service: Endoscopy;  Laterality: N/A;  with esophageal dilation  . LASER ABLATION Left 02/03/15  . VARICOSE VEIN SURGERY      Social History   Socioeconomic History  . Marital status: Divorced    Spouse name: Not on file  . Number of children: Not on file  . Years of education: Not on file  . Highest education level: Not on file  Occupational History  . Not on file  Social Needs  . Financial resource strain: Not on file  . Food  insecurity:    Worry: Not on file    Inability: Not on file  . Transportation needs:    Medical: Not on file    Non-medical: Not on file  Tobacco Use  . Smoking status: Never Smoker  . Smokeless tobacco: Never Used  Substance and Sexual Activity  . Alcohol use: No    Alcohol/week: 0.0 standard drinks  . Drug use: No  . Sexual activity: Never  Lifestyle  . Physical activity:    Days per week: Not on file    Minutes per session: Not on file  . Stress: Not on file  Relationships  . Social connections:    Talks on phone: Not on file    Gets together: Not on file    Attends religious service: Not on file    Active member of club or organization: Not on file    Attends meetings of clubs or organizations: Not on file    Relationship status: Not on file  Other Topics Concern  . Not on file  Social History Narrative  . Not on file    Family History  Problem Relation Age of Onset  . Parkinson's disease Father   . Heart attack Maternal Grandmother     Health Maintenance  Topic Date Due  . Hepatitis C Screening  [id number]  . FOOT EXAM  01/13/1969  .  URINE MICROALBUMIN  01/13/1969  . TETANUS/TDAP  01/13/1978  . COLONOSCOPY  01/13/2009  . OPHTHALMOLOGY EXAM  01/05/2018  . HEMOGLOBIN A1C  02/03/2018  . INFLUENZA VACCINE  07/18/2018 (Originally 11/17/2017)  . PNEUMOCOCCAL POLYSACCHARIDE VACCINE AGE 22-64 HIGH RISK  Completed  . HIV Screening  Completed    ----------------------------------------------------------------------------------------------------------------------------------------------------------------------------------------------------------------- Physical Exam BP 138/74   Pulse 83   Temp 98.3 F (36.8 C) (Oral)   Ht 6\' 3"  (1.905 m)   Wt 227 lb (103 kg)   SpO2 95%   BMI 28.37 kg/m   Physical Exam Constitutional:      Appearance: Normal appearance.  HENT:     Head: Normocephalic and atraumatic.     Mouth/Throat:     Mouth: Mucous membranes are moist.    Eyes:     General: No scleral icterus. Cardiovascular:     Rate and Rhythm: Normal rate and regular rhythm.     Pulses:          Dorsalis pedis pulses are 2+ on the right side and 2+ on the left side.       Posterior tibial pulses are 2+ on the right side and 2+ on the left side.  Pulmonary:     Effort: Pulmonary effort is normal.     Breath sounds: Normal breath sounds.  Musculoskeletal:     Right foot: Normal range of motion. No deformity.     Left foot: Normal range of motion. No deformity.  Feet:     Right foot:     Protective Sensation: 5 sites tested. 5 sites sensed.     Skin integrity: No ulcer, blister, skin breakdown, erythema, warmth, callus or dry skin.     Left foot:     Protective Sensation: 5 sites tested. 5 sites sensed.     Skin integrity: No ulcer, blister, skin breakdown, erythema, warmth, callus or dry skin.  Skin:    General: Skin is warm and dry.  Neurological:     General: No focal deficit present.     Mental Status: He is alert.  Psychiatric:        Mood and Affect: Mood normal.        Behavior: Behavior normal.     ------------------------------------------------------------------------------------------------------------------------------------------------------------------------------------------------------------------- Assessment and Plan  Dysphagia -Referral place to GI, requesitng Dr. Jenean Lindauhodes in [place], [place]  Type 2 diabetes mellitus with hyperlipidemia (HCC) -Diabetes has not been well controlled -Requests referral back to endocrinology  -Pneumovax given today.  -Update labs today.

## 2018-04-07 NOTE — Assessment & Plan Note (Addendum)
-  Diabetes has not been well controlled -Requests referral back to endocrinology  -Pneumovax given today.  -Update labs today.

## 2018-04-10 NOTE — Progress Notes (Signed)
Diabetes control is better than It has been but still not well controlled.  Keep appt with Dr. Everardo AllEllison on 1/16.

## 2018-05-01 ENCOUNTER — Ambulatory Visit: Payer: BLUE CROSS/BLUE SHIELD | Admitting: Family Medicine

## 2018-05-01 ENCOUNTER — Other Ambulatory Visit: Payer: Self-pay | Admitting: Family Medicine

## 2018-05-01 ENCOUNTER — Encounter (HOSPITAL_COMMUNITY): Payer: Self-pay

## 2018-05-01 ENCOUNTER — Ambulatory Visit (HOSPITAL_BASED_OUTPATIENT_CLINIC_OR_DEPARTMENT_OTHER)
Admission: RE | Admit: 2018-05-01 | Discharge: 2018-05-01 | Disposition: A | Discharge status: Home / Self Care | Payer: BLUE CROSS/BLUE SHIELD | Source: Ambulatory Visit | Attending: Family Medicine | Admitting: Family Medicine

## 2018-05-01 ENCOUNTER — Encounter: Payer: Self-pay | Admitting: Family Medicine

## 2018-05-01 VITALS — BP 122/74 | HR 88 | Temp 98.7°F | Ht 75.0 in | Wt 232.0 lb

## 2018-05-01 DIAGNOSIS — I8001 Phlebitis and thrombophlebitis of superficial vessels of right lower extremity: Secondary | ICD-10-CM

## 2018-05-01 DIAGNOSIS — I82811 Embolism and thrombosis of superficial veins of right lower extremities: Secondary | ICD-10-CM | POA: Diagnosis not present

## 2018-05-01 MED ORDER — MEDICAL COMPRESSION THIGH HIGH MISC: 0 refills | Status: DC

## 2018-05-01 NOTE — Patient Instructions (Signed)
Phlebitis  Phlebitis is soreness and swelling (inflammation) of a vein. This can occur in your arms, legs, or torso (trunk), as well as deeper inside your body. Phlebitis is usually not serious when it occurs close to the surface of the body. However, it can cause serious problems when it occurs deeper inside the body.  What are the causes?  Phlebitis can be caused by:   Having a needle, IV tube, or long, thin tube (catheter) put in the vein.   Getting certain medicines or solutions through an IV tube. Some medicines or solutions, such as antibiotics and cancer medicines, can irritate the vein.   Having an IV tube for a long period of time.   Having an IV placed on the part of the arm or hand that moves a lot.   A blood clot.   Infection of the vein.   Surgery on a vein.  What increases the risk?  The following factors may make you more likely to develop this condition:   Being overweight or obese.   Pregnancy.   Cancer.   Reduced or slowing of blood flow through your veins. This can be caused by:  ? Bed rest over a long period of time.  ? Long distance travel.  ? Injury.  ? Surgery.  ? Congestive heart failure.  ? Inactivity.   Smoking.   Taking birth control pills or hormone replacement therapy.   Varicose veins.   Inflammatory diseases or blood disorders that increase clotting.   Taking illegal drugs through the vein.   Having a history of blood clots.  What are the signs or symptoms?  Symptoms of this condition include:   A red, tender, swollen, and painful area on your skin. Usually, the area is long and narrow, and may spread.   The affected area feeling warm to the touch.   Firmness along the center of the affected area.   Low fever.  How is this diagnosed?  This condition is diagnosed based on:   Your symptoms.   A physical exam.   Your medical history, including your family history.  Other tests may be done to rule out other conditions, such as blood clots, especially if you have a  history of blood clots or are at higher risk of developing blood clots. These may include:   Blood tests.   Ultrasound exam.   Genetic tests.   Biopsy. This is when a tissue is taken from the body for examination. This is rare.  How is this treated?  Treatment depends on the severity of the condition as well as the location of the inflammation. In most cases, the condition is minor and gets better quickly. Treatment may include:   Applying a warm compress or heating pad.   Wearing compression stockings or bandages.   Medicines, such as:  ? Anti-inflammatory medicines.  ? Antibiotic medicines if an infection is present.  ? Blood-thinning medicines if a blood clot is suspected or present, or if you have a history of blood clots or a blood disorder.   Removing an IV tube that may be causing the problem.   Using a different medicine or solution that will not irritate the vein.  In rare cases, surgery may be needed to remove a damaged section of a vein.  Follow these instructions at home:  Managing pain, stiffness, and swelling   If directed, apply heat to the affected area as often as told by your health care provider. Use the heat   source that your health care provider recommends, such as a moist heat pack or a heating pad.  ? Place a towel between your skin and the heat source.  ? Leave the heat on for 20-30 minutes.  ? Remove the heat if your skin turns bright red. This is especially important if you are unable to feel pain, heat, or cold. You may have a greater risk of getting burned.   Raise (elevate) the affected area above the level of your heart while you are sitting or lying down.  Medicines   Take over-the-counter and prescription medicines only as told by your health care provider.   If you were prescribed an antibiotic, take it as told by your health care provider. Do not stop using the antibiotic even if you start to feel better.   Carry a medical alert card or wear your medical alert jewelry  to show that you are on blood thinners, if this applies.  General instructions     If you have phlebitis in your legs:  ? Avoid sitting or standing for a long time.  ? Keep your legs moving.  ? Try to take short walks to break up long periods of sitting.  ? Try to avoid bed rest that lasts for long periods of time. Regular sleep is not part of bed rest.   Wear compression stockings as told by your health care provider. These stockings reduce swelling in your legs and help to prevent blood clots. They also help to prevent the condition from coming back.   Do not use any products that contain nicotine or tobacco, such as cigarettes and e-cigarettes. If you need help quitting, ask your health care provider.   Keep all follow-up visits as told by your health care provider. This is important. This may include any follow-up blood tests.  Contact a health care provider if:   You have unusual bruising or any bleeding problems.   Your symptoms do not get better, or your symptoms get worse.   You are on anti-inflammatory medicines and you develop abdominal pain.  Get help right away if:   You suddenly have chest pain or trouble breathing.   You have a fever and your symptoms suddenly get worse.   You cough up blood.   You feel lightheaded or pass out.   You have severe pain and swelling in the affected arm or leg.  These symptoms may represent a serious problem that is an emergency. Do not wait to see if the symptoms will go away. Get medical help right away. Call your local emergency services (911 in the [place] Do not drive yourself to the hospital.  Summary   Phlebitis is soreness and swelling (inflammation) of a vein.   Phlebitis is usually not serious when it occurs close to the surface of the body. However, it can cause serious problems when it occurs deeper inside the body.   Treatment depends on the severity of the condition and the location of the inflammation.   Raise (elevate) the affected area above  the level of your heart while you are sitting or lying down.  This information is not intended to replace advice given to you by your health care provider. Make sure you discuss any questions you have with your health care provider.  Document Released: 03/30/2001 Document Revised: 05/11/2016 Document Reviewed: 05/11/2016  Elsevier Interactive Patient Education  2019 [employer]

## 2018-05-01 NOTE — Assessment & Plan Note (Signed)
-  Exam consistent with phlebitis, [place] ordered given location and potential to extend into deep venous system.  -Recommend warm compress and NSAID for pain control.

## 2018-05-01 NOTE — Progress Notes (Signed)
[name] - 58 y.o. male MRN [id number]  Date of birth: Jan 24, 1961  Subjective Chief Complaint  Patient presents with  . Leg Pain    Right thigh pain-red and swollen ongong for two days    HPI [name] is a 58 y.o. male here today with complaint of swelling, redness and tenderness along medial R thigh.  Reports that symptoms started last night.  He was on his feet more yesterday moving heavy boxes up and down stairs.  Has history of varicose veins and had varicosities removed in the past.   Pain has improved some with icing but is still fairly tender.  He denies fever, chills.    ROS:  A comprehensive ROS was completed and negative except as noted per HPI  Allergies  Allergen Reactions  . Latex Rash    Past Medical History:  Diagnosis Date  . Allergy   . Cervical radiculopathy 08/09/2017  . Diabetes mellitus without complication (HCC)   . Stroke (cerebrum) (HCC) 08/03/2017  . Type 2 diabetes mellitus with hyperlipidemia [facility])     Past Surgical History:  Procedure Laterality Date  . ESOPHAGOGASTRODUODENOSCOPY (EGD) WITH PROPOFOL N/A 05/09/2015   Procedure: ESOPHAGOGASTRODUODENOSCOPY (EGD) WITH PROPOFOL;  Surgeon: Iva Boop, MD;  Location: [facility];  Service: Endoscopy;  Laterality: N/A;  with esophageal dilation  . LASER ABLATION Left 02/03/15  . VARICOSE VEIN SURGERY      Social History   Socioeconomic History  . Marital status: Divorced    Spouse name: Not on file  . Number of children: Not on file  . Years of education: Not on file  . Highest education level: Not on file  Occupational History  . Not on file  Social Needs  . Financial resource strain: Not on file  . Food insecurity:    Worry: Not on file    Inability: Not on file  . Transportation needs:    Medical: Not on file    Non-medical: Not on file  Tobacco Use  . Smoking status: Never Smoker  . Smokeless tobacco: Never Used  Substance and Sexual Activity  . Alcohol use: No   Alcohol/week: 0.0 standard drinks  . Drug use: No  . Sexual activity: Never  Lifestyle  . Physical activity:    Days per week: Not on file    Minutes per session: Not on file  . Stress: Not on file  Relationships  . Social connections:    Talks on phone: Not on file    Gets together: Not on file    Attends religious service: Not on file    Active member of club or organization: Not on file    Attends meetings of clubs or organizations: Not on file    Relationship status: Not on file  Other Topics Concern  . Not on file  Social History Narrative  . Not on file    Family History  Problem Relation Age of Onset  . Parkinson's disease Father   . Heart attack Maternal Grandmother     Health Maintenance  Topic Date Due  . Hepatitis C Screening  Jan 23, 1961  . TETANUS/TDAP  01/13/1978  . COLONOSCOPY  01/13/2009  . INFLUENZA VACCINE  07/18/2018 (Originally 11/17/2017)  . HEMOGLOBIN A1C  10/06/2018  . OPHTHALMOLOGY EXAM  03/21/2019  . FOOT EXAM  04/07/2019  . URINE MICROALBUMIN  04/07/2019  . PNEUMOCOCCAL POLYSACCHARIDE VACCINE AGE 69-64 HIGH RISK  Completed  . HIV Screening  Completed    ----------------------------------------------------------------------------------------------------------------------------------------------------------------------------------------------------------------- Physical  Exam BP 122/74   Pulse 88   Temp 98.7 F (37.1 C) (Oral)   Ht 6\' 3"  (1.905 m)   Wt 232 lb (105.2 kg)   SpO2 93%   BMI 29.00 kg/m   Physical Exam Constitutional:      Appearance: Normal appearance.  Cardiovascular:     Rate and Rhythm: Normal rate and regular rhythm.  Pulmonary:     Effort: Pulmonary effort is normal.     Breath sounds: Normal breath sounds.  Skin:    General: Skin is warm and dry.     Comments: Induration with palpable cord along medial thigh extending from knee to proximal thigh.  There is mild erythema and warmth.    Neurological:     General: No  focal deficit present.     Mental Status: He is alert.  Psychiatric:        Mood and Affect: Mood normal.        Behavior: Behavior normal.     ------------------------------------------------------------------------------------------------------------------------------------------------------------------------------------------------------------------- Assessment and Plan  Thrombophlebitis of right saphenous vein -Exam consistent with phlebitis, US ordered given location and potential to extend into deep venous system.  -Recommend warm compress and NSAID for pain control.

## 2018-05-02 ENCOUNTER — Ambulatory Visit: Payer: BLUE CROSS/BLUE SHIELD | Admitting: Family Medicine

## 2018-05-02 ENCOUNTER — Encounter: Payer: Self-pay | Admitting: Family Medicine

## 2018-05-02 DIAGNOSIS — I8001 Phlebitis and thrombophlebitis of superficial vessels of right lower extremity: Secondary | ICD-10-CM

## 2018-05-02 NOTE — Progress Notes (Signed)
[name] - 58 y.o. male MRN [id number]  Date of birth: May 18, 1960  SUBJECTIVE:  Including CC & ROS.  Chief Complaint  Patient presents with  . Leg Pain    right     [name] is a 58 y.o. male that is presenting with a superficial venous thrombosis.  He is having redness and pain of the right medial thigh.  The thrombosis extends from near the groin to the medial aspect of his knee.  He has pain with ambulation.  He has been taking anti-inflammatories.  Denies any fevers or chills.  Feels like the pain is been staying the same.  The redness has improved somewhat today.  He has a history of varicose veins and prior ablation therapy.  Denies any history of deep vein thrombosis.  No shortness of breath or trouble breathing.  Review of the venous duplex from 1/13 shows a superficial thrombosis within a varicose branch of the greater saphenous vein.   Review of Systems  Constitutional: Negative for fever.  HENT: Negative for congestion.   Respiratory: Negative for cough.   Cardiovascular: Negative for chest pain.  Gastrointestinal: Negative for abdominal pain.  Musculoskeletal: Negative for gait problem.  Skin: Positive for color change.  Neurological: Negative for weakness.  Hematological: Negative for adenopathy.  Psychiatric/Behavioral: Negative for agitation.    HISTORY: Past Medical, Surgical, Social, and Family History Reviewed & Updated per EMR.   Pertinent Historical Findings include:  Past Medical History:  Diagnosis Date  . Allergy   . Cervical radiculopathy 08/09/2017  . Diabetes mellitus without complication (HCC)   . Stroke (cerebrum) (HCC) 08/03/2017  . Type 2 diabetes mellitus with hyperlipidemia [facility])     Past Surgical History:  Procedure Laterality Date  . ESOPHAGOGASTRODUODENOSCOPY (EGD) WITH PROPOFOL N/A 05/09/2015   Procedure: ESOPHAGOGASTRODUODENOSCOPY (EGD) WITH PROPOFOL;  Surgeon: Iva Boop, MD;  Location: [facility];  Service: Endoscopy;   Laterality: N/A;  with esophageal dilation  . LASER ABLATION Left 02/03/15  . VARICOSE VEIN SURGERY      Allergies  Allergen Reactions  . Latex Rash    Family History  Problem Relation Age of Onset  . Parkinson's disease Father   . Heart attack Maternal Grandmother      Social History   Socioeconomic History  . Marital status: Married    Spouse name: Not on file  . Number of children: Not on file  . Years of education: Not on file  . Highest education level: Not on file  Occupational History  . Not on file  Social Needs  . Financial resource strain: Not on file  . Food insecurity:    Worry: Not on file    Inability: Not on file  . Transportation needs:    Medical: Not on file    Non-medical: Not on file  Tobacco Use  . Smoking status: Never Smoker  . Smokeless tobacco: Never Used  Substance and Sexual Activity  . Alcohol use: No    Alcohol/week: 0.0 standard drinks  . Drug use: No  . Sexual activity: Never  Lifestyle  . Physical activity:    Days per week: Not on file    Minutes per session: Not on file  . Stress: Not on file  Relationships  . Social connections:    Talks on phone: Not on file    Gets together: Not on file    Attends religious service: Not on file    Active member of club or organization: Not  on file    Attends meetings of clubs or organizations: Not on file    Relationship status: Not on file  . Intimate partner violence:    Fear of current or ex partner: Not on file    Emotionally abused: Not on file    Physically abused: Not on file    Forced sexual activity: Not on file  Other Topics Concern  . Not on file  Social History Narrative  . Not on file     PHYSICAL EXAM:  VS: BP 140/82   Pulse 91   Ht 6\' 3"  (1.905 m)   Wt 227 lb (103 kg)   SpO2 97%   BMI 28.37 kg/m  Physical Exam Gen: NAD, alert, cooperative with exam, well-appearing ENT: normal lips, normal nasal mucosa,  Eye: normal EOM, normal conjunctiva and lids CV:   no edema, +2 pedal pulses   Resp: no accessory muscle use, non-labored,  Skin: no rashes, no areas of induration  Neuro: normal tone, normal sensation to touch Psych:  normal insight, alert and oriented MSK:  Right leg: Erythema is present over the anterior medial portion of the thigh. Able to palpate the thrombosis from the medial aspect of the knee and proximally extending medially to the groin. Normal internal/external rotation of the right hip Neurovascular intact     ASSESSMENT & PLAN:   Thrombophlebitis of right saphenous vein Has ongoing pain and redness from the superficial venous thrombosis.  This does measure greater than 20 cm. -Counseled on compression. -Provided samples of Pennsaid. -Provided work note with light duty. -If no improvement in the clot then may need to have a trial of anti-coagulation for 45 days due to the length of the thrombosis.

## 2018-05-02 NOTE — Assessment & Plan Note (Signed)
Has ongoing pain and redness from the superficial venous thrombosis.  This does measure greater than 20 cm. -Counseled on compression. -Provided samples of Pennsaid. -Provided work note with light duty. -If no improvement in the clot then may need to have a trial of anti-coagulation for 45 days due to the length of the thrombosis.

## 2018-05-02 NOTE — Patient Instructions (Signed)
Nice to meet you  Please continue the compression, heat, and anti-inflammatory  Please let us know if your symptoms don't improve by the end of the week.

## 2018-05-04 ENCOUNTER — Encounter: Payer: Self-pay | Admitting: Endocrinology

## 2018-05-04 ENCOUNTER — Ambulatory Visit: Payer: BLUE CROSS/BLUE SHIELD | Admitting: Endocrinology

## 2018-05-04 ENCOUNTER — Telehealth: Payer: Self-pay | Admitting: Endocrinology

## 2018-05-04 VITALS — BP 130/80 | HR 80 | Ht 75.0 in | Wt 230.2 lb

## 2018-05-04 DIAGNOSIS — E1169 Type 2 diabetes mellitus with other specified complication: Secondary | ICD-10-CM

## 2018-05-04 DIAGNOSIS — E785 Hyperlipidemia, unspecified: Secondary | ICD-10-CM | POA: Diagnosis not present

## 2018-05-04 MED ORDER — METFORMIN HCL 500 MG PO TABS: 1000.0000 mg | ORAL_TABLET | Freq: Two times a day (BID) | ORAL | 11 refills | Status: DC

## 2018-05-04 MED ORDER — GLUCOSE BLOOD VI STRP: 1.0000 | ORAL_STRIP | Freq: Every day | 3 refills | Status: DC

## 2018-05-04 MED ORDER — LINAGLIPTIN 5 MG PO TABS: 5.0000 mg | ORAL_TABLET | Freq: Every day | ORAL | 11 refills | Status: DC

## 2018-05-04 NOTE — Progress Notes (Signed)
Subjective:    Patient ID: [name], male    DOB: June 26, 1960, 58 y.o.   MRN: [id number]  HPI pt is referred by Dr Ashley RoyaltyMatthews, for diabetes.  Pt states DM was dx'ed in 2005; he has mild if any neuropathy of the lower extremities; he is unaware of any associated chronic complications; he has never been on insulin, except in the hospital; pt says his diet and exercise are fair; he has never had pancreatitis, pancreatic surgery, severe hypoglycemia or DKA.  He takes metformin as rx'ed.  His job requires  CDL.   Past Medical History:  Diagnosis Date  . Allergy   . Cervical radiculopathy 08/09/2017  . Diabetes mellitus without complication (HCC)   . Stroke (cerebrum) (HCC) 08/03/2017  . Type 2 diabetes mellitus with hyperlipidemia [facility](HCC)     Past Surgical History:  Procedure Laterality Date  . ESOPHAGOGASTRODUODENOSCOPY (EGD) WITH PROPOFOL N/A 05/09/2015   Procedure: ESOPHAGOGASTRODUODENOSCOPY (EGD) WITH PROPOFOL;  Surgeon: Iva Booparl E Gessner, MD;  Location: [facility];  Service: Endoscopy;  Laterality: N/A;  with esophageal dilation  . LASER ABLATION Left 02/03/15  . VARICOSE VEIN SURGERY      Social History   Socioeconomic History  . Marital status: Married    Spouse name: Not on file  . Number of children: Not on file  . Years of education: Not on file  . Highest education level: Not on file  Occupational History  . Not on file  Social Needs  . Financial resource strain: Not on file  . Food insecurity:    Worry: Not on file    Inability: Not on file  . Transportation needs:    Medical: Not on file    Non-medical: Not on file  Tobacco Use  . Smoking status: Never Smoker  . Smokeless tobacco: Never Used  Substance and Sexual Activity  . Alcohol use: No    Alcohol/week: 0.0 standard drinks  . Drug use: No  . Sexual activity: Never  Lifestyle  . Physical activity:    Days per week: Not on file    Minutes per session: Not on file  . Stress: Not on file  Relationships    . Social connections:    Talks on phone: Not on file    Gets together: Not on file    Attends religious service: Not on file    Active member of club or organization: Not on file    Attends meetings of clubs or organizations: Not on file    Relationship status: Not on file  . Intimate partner violence:    Fear of current or ex partner: Not on file    Emotionally abused: Not on file    Physically abused: Not on file    Forced sexual activity: Not on file  Other Topics Concern  . Not on file  Social History Narrative  . Not on file    Current Outpatient Medications on File Prior to Visit  Medication Sig Dispense Refill  . Elastic Bandages & Supports (MEDICAL COMPRESSION THIGH HIGH) MISC 20-4230mmHG 1 each 0  . ibuprofen (ADVIL,MOTRIN) 800 MG tablet Take 800 mg by mouth every 8 (eight) hours as needed.     No current facility-administered medications on file prior to visit.     Allergies  Allergen Reactions  . Latex Rash    Family History  Problem Relation Age of Onset  . Parkinson's disease Father   . Heart attack Maternal Grandmother   . Diabetes Neg Hx  BP 130/80 (BP Location: Right Arm, Patient Position: Sitting, Cuff Size: Normal)   Pulse 80   Ht 6\' 3"  (1.905 m)   Wt 230 lb 3.2 oz (104.4 kg)   SpO2 92%   BMI 28.77 kg/m     Review of Systems denies weight loss, blurry vision, headache, chest pain, sob, n/v, urinary frequency, excessive diaphoresis, memory loss, depression, cold intolerance, rhinorrhea, and easy bruising.  He has leg cramps and mild memory loss.      Objective:   Physical Exam VS: see vs page GEN: no distress HEAD: head: no deformity eyes: no periorbital swelling, no proptosis external nose and ears are normal mouth: no lesion seen NECK: supple, thyroid is not enlarged CHEST WALL: no deformity LUNGS: clear to auscultation CV: reg rate and rhythm, no murmur ABD: abdomen is soft, nontender.  no hepatosplenomegaly.  not distended.  no  hernia MUSCULOSKELETAL: muscle bulk and strength are grossly normal.  no obvious joint swelling.  gait is normal and steady EXTEMITIES: no deformity.  no ulcer on the feet.  feet are of normal color and temp.  no leg edema PULSES: dorsalis pedis intact bilat.  no carotid bruit NEURO:  cn 2-12 grossly intact.   readily moves all 4's.  sensation is intact to touch on the feet SKIN:  Normal texture and temperature.  No rash or suspicious lesion is visible.   NODES:  None palpable at the neck PSYCH: alert, well-oriented.  Does not appear anxious nor depressed.    Lab Results  Component Value Date   HGBA1C 9.6 (H) 04/06/2018   Lab Results  Component Value Date   CREATININE 0.87 04/06/2018   BUN 13 04/06/2018   NA 139 04/06/2018   K 3.8 04/06/2018   CL 104 04/06/2018   CO2 26 04/06/2018    Lab Results  Component Value Date   TSH 0.441 05/06/2015   I have reviewed outside records, and summarized: Pt was noted to have elevated a1c, and referred here.  Main problem addressed was superficial thrombophlebitis.       Assessment & Plan:  Type 2 DM, new to me Lean body habitus: this and neg FHx suggest he is prob evolv type 1.   Occupational status: goal is good glycemic control without insulin.     Patient Instructions  good diet and exercise significantly improve the control of your diabetes.  please let me know if you wish to be referred to a dietician.  high blood sugar is very risky to your health.  you should see an eye doctor and dentist every year.  It is very important to get all recommended vaccinations.  Controlling your blood pressure and cholesterol drastically reduces the damage diabetes does to your body.  Those who smoke should quit.  Please discuss these with your doctor.  check your blood sugar once a day.  vary the time of day when you check, between before the 3 meals, and at bedtime.  also check if you have symptoms of your blood sugar being too high or too low.  please  keep a record of the readings and bring it to your next appointment here (or you can bring the meter itself).  You can write it on any piece of paper.  please call us sooner if your blood sugar goes below 70, or if you have a lot of readings over 200. Here is a new meter.  I have sent a prescription to your pharmacy, for strips.  I have sent  a prescription to your pharmacy, to add "tradjenta." Please call or message [place] next week, to tell us how the blood sugar is doing.  If necessary, we can add "[place]."  Our goal is to get the blood sugar to the low to mid-100's.   Please come back for a follow-up appointment in 6 weeks.        Diabetes Mellitus and Nutrition, Adult When you have diabetes (diabetes mellitus), it is very important to have healthy eating habits because your blood sugar (glucose) levels are greatly affected by what you eat and drink. Eating healthy foods in the appropriate amounts, at about the same times every day, can help you:  Control your blood glucose.  Lower your risk of heart disease.  Improve your blood pressure.  Reach or maintain a healthy weight. Every person with diabetes is different, and each person has different needs for a meal plan. Your health care provider may recommend that you work with a diet and nutrition specialist (dietitian) to make a meal plan that is best for you. Your meal plan may vary depending on factors such as:  The calories you need.  The medicines you take.  Your weight.  Your blood glucose, blood pressure, and cholesterol levels.  Your activity level.  Other health conditions you have, such as heart or kidney disease. How do carbohydrates affect me? Carbohydrates, also called carbs, affect your blood glucose level more than any other type of food. Eating carbs naturally raises the amount of glucose in your blood. Carb counting is a method for keeping track of how many carbs you eat. Counting carbs is important to keep your  blood glucose at a healthy level, especially if you use insulin or take certain oral diabetes medicines. It is important to know how many carbs you can safely have in each meal. This is different for every person. Your dietitian can help you calculate how many carbs you should have at each meal and for each snack. Foods that contain carbs include:  Bread, cereal, rice, pasta, and crackers.  Potatoes and corn.  Peas, beans, and lentils.  Milk and yogurt.  Fruit and juice.  Desserts, such as cakes, cookies, ice cream, and candy. How does alcohol affect me? Alcohol can cause a sudden decrease in blood glucose (hypoglycemia), especially if you use insulin or take certain oral diabetes medicines. Hypoglycemia can be a life-threatening condition. Symptoms of hypoglycemia (sleepiness, dizziness, and confusion) are similar to symptoms of having too much alcohol. If your health care provider says that alcohol is safe for you, follow these guidelines:  Limit alcohol intake to no more than 1 drink per day for nonpregnant women and 2 drinks per day for men. One drink equals 12 oz of beer, 5 oz of wine, or 1 oz of hard liquor.  Do not drink on an empty stomach.  Keep yourself hydrated with water, diet soda, or unsweetened iced tea.  Keep in mind that regular soda, juice, and other mixers may contain a lot of sugar and must be counted as carbs. What are tips for following this plan?  Reading food labels  Start by checking the serving size on the "Nutrition Facts" label of packaged foods and drinks. The amount of calories, carbs, fats, and other nutrients listed on the label is based on one serving of the item. Many items contain more than one serving per package.  Check the total grams (g) of carbs in one serving. You can calculate the number of  servings of carbs in one serving by dividing the total carbs by 15. For example, if a food has 30 g of total carbs, it would be equal to 2 servings of  carbs.  Check the number of grams (g) of saturated and trans fats in one serving. Choose foods that have low or no amount of these fats.  Check the number of milligrams (mg) of salt (sodium) in one serving. Most people should limit total sodium intake to less than 2,300 mg per day.  Always check the nutrition information of foods labeled as "low-fat" or "nonfat". These foods may be higher in added sugar or refined carbs and should be avoided.  Talk to your dietitian to identify your daily goals for nutrients listed on the label. Shopping  Avoid buying canned, premade, or processed foods. These foods tend to be high in fat, sodium, and added sugar.  Shop around the outside edge of the grocery store. This includes fresh fruits and vegetables, bulk grains, fresh meats, and fresh dairy. Cooking  Use low-heat cooking methods, such as baking, instead of high-heat cooking methods like deep frying.  Cook using healthy oils, such as olive, canola, or sunflower oil.  Avoid cooking with butter, cream, or high-fat meats. Meal planning  Eat meals and snacks regularly, preferably at the same times every day. Avoid going long periods of time without eating.  Eat foods high in fiber, such as fresh fruits, vegetables, beans, and whole grains. Talk to your dietitian about how many servings of carbs you can eat at each meal.  Eat 4-6 ounces (oz) of lean protein each day, such as lean meat, chicken, fish, eggs, or tofu. One oz of lean protein is equal to: ? 1 oz of meat, chicken, or fish. ? 1 egg. ?  cup of tofu.  Eat some foods each day that contain healthy fats, such as avocado, nuts, seeds, and fish. Lifestyle  Check your blood glucose regularly.  Exercise regularly as told by your health care provider. This may include: ? 150 minutes of moderate-intensity or vigorous-intensity exercise each week. This could be brisk walking, biking, or water aerobics. ? Stretching and doing strength  exercises, such as yoga or weightlifting, at least 2 times a week.  Take medicines as told by your health care provider.  Do not use any products that contain nicotine or tobacco, such as cigarettes and e-cigarettes. If you need help quitting, ask your health care provider.  Work with a Veterinary surgeon or diabetes educator to identify strategies to manage stress and any emotional and social challenges. Questions to ask a health care provider  Do I need to meet with a diabetes educator?  Do I need to meet with a dietitian?  What number can I call if I have questions?  When are the best times to check my blood glucose? Where to find more information:  American Diabetes Association: diabetes.org  Academy of Nutrition and Dietetics: www.eatright.[employer] of Diabetes and Digestive and Kidney Diseases (NIH): CarFlippers.tn Summary  A healthy meal plan will help you control your blood glucose and maintain a healthy lifestyle.  Working with a diet and nutrition specialist (dietitian) can help you make a meal plan that is best for you.  Keep in mind that carbohydrates (carbs) and alcohol have immediate effects on your blood glucose levels. It is important to count carbs and to use alcohol carefully. This information is not intended to replace advice given to you by your health care provider. Make  sure you discuss any questions you have with your health care provider. Document Released: 12/31/2004 Document Revised: 11/03/2016 Document Reviewed: 05/10/2016 Elsevier Interactive Patient Education  2019 [employer].

## 2018-05-04 NOTE — Telephone Encounter (Signed)
Patient called back and stated his insurance will cover the Contour next  And needs the contour next test strips sent into his pharmacy      [facility] - [place], [place] - [address]

## 2018-05-04 NOTE — Patient Instructions (Addendum)
good diet and exercise significantly improve the control of your diabetes.  please let me know if you wish to be referred to a dietician.  high blood sugar is very risky to your health.  you should see an eye doctor and dentist every year.  It is very important to get all recommended vaccinations.  Controlling your blood pressure and cholesterol drastically reduces the damage diabetes does to your body.  Those who smoke should quit.  Please discuss these with your doctor.  check your blood sugar once a day.  vary the time of day when you check, between before the 3 meals, and at bedtime.  also check if you have symptoms of your blood sugar being too high or too low.  please keep a record of the readings and bring it to your next appointment here (or you can bring the meter itself).  You can write it on any piece of paper.  please call us sooner if your blood sugar goes below 70, or if you have a lot of readings over 200. Here is a new meter.  I have sent a prescription to your pharmacy, for strips.  I have sent a prescription to your pharmacy, to add "tradjenta." Please call or message [place] next week, to tell us how the blood sugar is doing.  If necessary, we can add "[place]."  Our goal is to get the blood sugar to the low to mid-100's.   Please come back for a follow-up appointment in 6 weeks.        Diabetes Mellitus and Nutrition, Adult When you have diabetes (diabetes mellitus), it is very important to have healthy eating habits because your blood sugar (glucose) levels are greatly affected by what you eat and drink. Eating healthy foods in the appropriate amounts, at about the same times every day, can help you:  Control your blood glucose.  Lower your risk of heart disease.  Improve your blood pressure.  Reach or maintain a healthy weight. Every person with diabetes is different, and each person has different needs for a meal plan. Your health care provider may recommend that you work  with a diet and nutrition specialist (dietitian) to make a meal plan that is best for you. Your meal plan may vary depending on factors such as:  The calories you need.  The medicines you take.  Your weight.  Your blood glucose, blood pressure, and cholesterol levels.  Your activity level.  Other health conditions you have, such as heart or kidney disease. How do carbohydrates affect me? Carbohydrates, also called carbs, affect your blood glucose level more than any other type of food. Eating carbs naturally raises the amount of glucose in your blood. Carb counting is a method for keeping track of how many carbs you eat. Counting carbs is important to keep your blood glucose at a healthy level, especially if you use insulin or take certain oral diabetes medicines. It is important to know how many carbs you can safely have in each meal. This is different for every person. Your dietitian can help you calculate how many carbs you should have at each meal and for each snack. Foods that contain carbs include:  Bread, cereal, rice, pasta, and crackers.  Potatoes and corn.  Peas, beans, and lentils.  Milk and yogurt.  Fruit and juice.  Desserts, such as cakes, cookies, ice cream, and candy. How does alcohol affect me? Alcohol can cause a sudden decrease in blood glucose (hypoglycemia), especially if you  use insulin or take certain oral diabetes medicines. Hypoglycemia can be a life-threatening condition. Symptoms of hypoglycemia (sleepiness, dizziness, and confusion) are similar to symptoms of having too much alcohol. If your health care provider says that alcohol is safe for you, follow these guidelines:  Limit alcohol intake to no more than 1 drink per day for nonpregnant women and 2 drinks per day for men. One drink equals 12 oz of beer, 5 oz of wine, or 1 oz of hard liquor.  Do not drink on an empty stomach.  Keep yourself hydrated with water, diet soda, or unsweetened iced  tea.  Keep in mind that regular soda, juice, and other mixers may contain a lot of sugar and must be counted as carbs. What are tips for following this plan?  Reading food labels  Start by checking the serving size on the "Nutrition Facts" label of packaged foods and drinks. The amount of calories, carbs, fats, and other nutrients listed on the label is based on one serving of the item. Many items contain more than one serving per package.  Check the total grams (g) of carbs in one serving. You can calculate the number of servings of carbs in one serving by dividing the total carbs by 15. For example, if a food has 30 g of total carbs, it would be equal to 2 servings of carbs.  Check the number of grams (g) of saturated and trans fats in one serving. Choose foods that have low or no amount of these fats.  Check the number of milligrams (mg) of salt (sodium) in one serving. Most people should limit total sodium intake to less than 2,300 mg per day.  Always check the nutrition information of foods labeled as "low-fat" or "nonfat". These foods may be higher in added sugar or refined carbs and should be avoided.  Talk to your dietitian to identify your daily goals for nutrients listed on the label. Shopping  Avoid buying canned, premade, or processed foods. These foods tend to be high in fat, sodium, and added sugar.  Shop around the outside edge of the grocery store. This includes fresh fruits and vegetables, bulk grains, fresh meats, and fresh dairy. Cooking  Use low-heat cooking methods, such as baking, instead of high-heat cooking methods like deep frying.  Cook using healthy oils, such as olive, canola, or sunflower oil.  Avoid cooking with butter, cream, or high-fat meats. Meal planning  Eat meals and snacks regularly, preferably at the same times every day. Avoid going long periods of time without eating.  Eat foods high in fiber, such as fresh fruits, vegetables, beans, and  whole grains. Talk to your dietitian about how many servings of carbs you can eat at each meal.  Eat 4-6 ounces (oz) of lean protein each day, such as lean meat, chicken, fish, eggs, or tofu. One oz of lean protein is equal to: ? 1 oz of meat, chicken, or fish. ? 1 egg. ?  cup of tofu.  Eat some foods each day that contain healthy fats, such as avocado, nuts, seeds, and fish. Lifestyle  Check your blood glucose regularly.  Exercise regularly as told by your health care provider. This may include: ? 150 minutes of moderate-intensity or vigorous-intensity exercise each week. This could be brisk walking, biking, or water aerobics. ? Stretching and doing strength exercises, such as yoga or weightlifting, at least 2 times a week.  Take medicines as told by your health care provider.  Do not use  any products that contain nicotine or tobacco, such as cigarettes and e-cigarettes. If you need help quitting, ask your health care provider.  Work with a Veterinary surgeoncounselor or diabetes educator to identify strategies to manage stress and any emotional and social challenges. Questions to ask a health care provider  Do I need to meet with a diabetes educator?  Do I need to meet with a dietitian?  What number can I call if I have questions?  When are the best times to check my blood glucose? Where to find more information:  American Diabetes Association: diabetes.org  Academy of Nutrition and Dietetics: www.eatright.[employer]  National Institute of Diabetes and Digestive and Kidney Diseases (NIH): CarFlippers.tnwww.niddk.nih.gov Summary  A healthy meal plan will help you control your blood glucose and maintain a healthy lifestyle.  Working with a diet and nutrition specialist (dietitian) can help you make a meal plan that is best for you.  Keep in mind that carbohydrates (carbs) and alcohol have immediate effects on your blood glucose levels. It is important to count carbs and to use alcohol carefully. This  information is not intended to replace advice given to you by your health care provider. Make sure you discuss any questions you have with your health care provider. Document Released: 12/31/2004 Document Revised: 11/03/2016 Document Reviewed: 05/10/2016 Elsevier Interactive Patient Education  2019 [employer]

## 2018-05-04 NOTE — Telephone Encounter (Signed)
FYI   Patient has called stated that insurance does not cover the One Touch Verio test strips that he received at his appointment today. Stated it was either breath or contour. Patient states they will either call back or come in the office.

## 2018-05-04 NOTE — Telephone Encounter (Signed)
linagliptin (TRADJENTA) 5 MG TABS tablet  Patient stated that he is needing a PA started for this medication

## 2018-05-05 ENCOUNTER — Other Ambulatory Visit: Payer: Self-pay

## 2018-05-05 DIAGNOSIS — E291 Testicular hypofunction: Secondary | ICD-10-CM | POA: Diagnosis not present

## 2018-05-05 DIAGNOSIS — E785 Hyperlipidemia, unspecified: Principal | ICD-10-CM

## 2018-05-05 DIAGNOSIS — E1169 Type 2 diabetes mellitus with other specified complication: Secondary | ICD-10-CM

## 2018-05-05 MED ORDER — CONTOUR BLOOD GLUCOSE SYSTEM W/DEVICE KIT: 1.0000 | PACK | Freq: Every day | Status: DC

## 2018-05-05 MED ORDER — GLUCOSE BLOOD VI STRP: ORAL_STRIP | 12 refills | Status: DC

## 2018-05-05 MED ORDER — MICROLET LANCETS MISC: 1.0000 | Freq: Every day | 3 refills | Status: DC

## 2018-05-05 NOTE — Telephone Encounter (Signed)
This information was received by pt pharmacy this morning. PA request for Tradjenta has been placed on Dr. George Hugh desk to address. Currently taking care of discontinuing non-covered meter, strips and lancets and changing to covered device, strips and lancets.

## 2018-05-08 ENCOUNTER — Telehealth: Payer: Self-pay | Admitting: Endocrinology

## 2018-05-08 MED ORDER — SAXAGLIPTIN HCL 5 MG PO TABS: 5.0000 mg | ORAL_TABLET | Freq: Every day | ORAL | 3 refills | Status: DC

## 2018-05-08 NOTE — Telephone Encounter (Signed)
please call patient: Ins wants you to change tradjenta to Onglyza. They are similar.  I have sent a prescription to your pharmacy, to change.

## 2018-05-08 NOTE — Telephone Encounter (Signed)
Patient informed. 

## 2018-05-09 ENCOUNTER — Telehealth: Payer: Self-pay | Admitting: Endocrinology

## 2018-05-09 MED ORDER — METFORMIN HCL ER 500 MG PO TB24: 2000.0000 mg | ORAL_TABLET | Freq: Every day | ORAL | 3 refills | Status: DC

## 2018-05-09 NOTE — Telephone Encounter (Signed)
LMTCB

## 2018-05-09 NOTE — Telephone Encounter (Signed)
please call patient: I have changed metformin to -XR.  I'll see you next time.

## 2018-05-09 NOTE — Telephone Encounter (Signed)
Called pt and made him aware. Advised to f/u with pharmacy for status of pick up. Verbalized acceptance and understanding.

## 2018-06-16 ENCOUNTER — Ambulatory Visit: Payer: Self-pay | Admitting: Endocrinology

## 2018-06-21 ENCOUNTER — Telehealth: Payer: Self-pay | Admitting: Endocrinology

## 2018-06-21 NOTE — Telephone Encounter (Signed)
Called pt and attempted to reschedule f/u appt and pt stated that he was still getting over the FLU and he would call back at a later time and schedule a f/u.

## 2018-06-21 NOTE — Telephone Encounter (Signed)
OV is due.  Let's address then

## 2018-06-21 NOTE — Telephone Encounter (Signed)
Patient states that he is taking saxagliptin HCl (ONGLYZA) 5 MG TABS tablet and since taking it he has had numbing in his hands and feeling achy. He would like to switch back to metformin. Advised patient to not stop taking the medication and start taking something else without hearing from the doctor. He stated he would because he doesn't like how it makes him feel.   Please Advise, thanks

## 2018-06-21 NOTE — Telephone Encounter (Signed)
Please advise 

## 2018-07-05 ENCOUNTER — Ambulatory Visit: Payer: BLUE CROSS/BLUE SHIELD | Admitting: Family Medicine

## 2018-07-05 ENCOUNTER — Other Ambulatory Visit: Payer: Self-pay

## 2018-07-05 ENCOUNTER — Encounter: Payer: Self-pay | Admitting: Family Medicine

## 2018-07-05 DIAGNOSIS — M7581 Other shoulder lesions, right shoulder: Secondary | ICD-10-CM

## 2018-07-05 MED ORDER — METHYLPREDNISOLONE ACETATE 40 MG/ML IJ SUSP
40.0000 mg | Freq: Once | INTRAMUSCULAR | Status: AC
  Administered 2018-07-05: 40 mg via INTRAMUSCULAR

## 2018-07-05 NOTE — Progress Notes (Signed)
[name] - 58 y.o. male MRN [id number]  Date of birth: 12-24-1960  Subjective Chief Complaint  Patient presents with  . Shoulder Pain    R    HPI [name] is a 58 y.o. male with history of T2DM here today with complaint of R shoulder pain.  He reports that he had a fall a couple of weeks ago landing on the R shoulder. Started having pain and decreased ROM the following day.  Denies associated numbness/tingling.  ROM limited in abduction and flexion.  He has not tried anything at home for treatment.   ROS:  A comprehensive ROS was completed and negative except as noted per HPI  Allergies  Allergen Reactions  . Latex Rash    Past Medical History:  Diagnosis Date  . Allergy   . Cervical radiculopathy 08/09/2017  . Diabetes mellitus without complication (HCC)   . Stroke (cerebrum) (HCC) 08/03/2017  . Type 2 diabetes mellitus with hyperlipidemia [facility])     Past Surgical History:  Procedure Laterality Date  . ESOPHAGOGASTRODUODENOSCOPY (EGD) WITH PROPOFOL N/A 05/09/2015   Procedure: ESOPHAGOGASTRODUODENOSCOPY (EGD) WITH PROPOFOL;  Surgeon: Iva Boop, MD;  Location: [facility];  Service: Endoscopy;  Laterality: N/A;  with esophageal dilation  . LASER ABLATION Left 02/03/15  . VARICOSE VEIN SURGERY      Social History   Socioeconomic History  . Marital status: Married    Spouse name: Not on file  . Number of children: Not on file  . Years of education: Not on file  . Highest education level: Not on file  Occupational History  . Not on file  Social Needs  . Financial resource strain: Not on file  . Food insecurity:    Worry: Not on file    Inability: Not on file  . Transportation needs:    Medical: Not on file    Non-medical: Not on file  Tobacco Use  . Smoking status: Never Smoker  . Smokeless tobacco: Never Used  Substance and Sexual Activity  . Alcohol use: No    Alcohol/week: 0.0 standard drinks  . Drug use: No  . Sexual activity: Never   Lifestyle  . Physical activity:    Days per week: Not on file    Minutes per session: Not on file  . Stress: Not on file  Relationships  . Social connections:    Talks on phone: Not on file    Gets together: Not on file    Attends religious service: Not on file    Active member of club or organization: Not on file    Attends meetings of clubs or organizations: Not on file    Relationship status: Not on file  Other Topics Concern  . Not on file  Social History Narrative  . Not on file    Family History  Problem Relation Age of Onset  . Parkinson's disease Father   . Heart attack Maternal Grandmother   . Diabetes Neg Hx     Health Maintenance  Topic Date Due  . Hepatitis C Screening  1961/01/10  . TETANUS/TDAP  01/13/1978  . COLONOSCOPY  01/13/2009  . INFLUENZA VACCINE  07/18/2018 (Originally 11/17/2017)  . HEMOGLOBIN A1C  10/06/2018  . OPHTHALMOLOGY EXAM  03/21/2019  . URINE MICROALBUMIN  04/07/2019  . FOOT EXAM  05/05/2019  . PNEUMOCOCCAL POLYSACCHARIDE VACCINE AGE 62-64 HIGH RISK  Completed  . HIV Screening  Completed    ----------------------------------------------------------------------------------------------------------------------------------------------------------------------------------------------------------------- Physical Exam BP 136/78   Physical Exam  Constitutional:      Appearance: Normal appearance.  HENT:     Head: Normocephalic and atraumatic.  Neck:     Musculoskeletal: Normal range of motion.  Cardiovascular:     Rate and Rhythm: Normal rate and regular rhythm.  Pulmonary:     Effort: Pulmonary effort is normal.     Breath sounds: Normal breath sounds.  Musculoskeletal:     Comments: R shoulder with limited ROM on Flexion and Abduction.  Less pain with PROM.  +impingement testing and empty can.  Negative drop arm test.    Neurological:     General: No focal deficit present.     Mental Status: He is alert.  Psychiatric:        Mood and  Affect: Mood normal.        Behavior: Behavior normal.    Procedure:  Procedure reviewed along with potential complications including infection, injection reaction, bleeding and post injection pain flare.  All questions answered and he agrees to proceed with injection.  The R shoulder was prepped in typical sterile fashion with betadine.  A cold spray was applied and the R subacromial space was injected using a posterior approach with depo-medrol 40mg /mL and 33mL of 1% lidocaine.  He tolerated procedure well without any immediate complications.  Post procedure instructions given.  He did have some relief immediately afterwards. ------------------------------------------------------------------------------------------------------------------------------------------------------------------------------------------------------------------- Assessment and Plan  Rotator cuff tendinitis, right -R rotator cuff tendintis/bursitis.  Injected today, see procedure note.  -Recommend light duty for the next 48 hours.  -Monitor blood sugars closely for the next few days -Call if not improving.

## 2018-07-05 NOTE — Assessment & Plan Note (Signed)
-  R rotator cuff tendintis/bursitis.  Injected today, see procedure note.  -Recommend light duty for the next 48 hours.  -Monitor blood sugars closely for the next few days -Call if not improving.

## 2018-07-05 NOTE — Patient Instructions (Signed)
Rotator Cuff Tendinitis  Rotator cuff tendinitis is inflammation of the tough, cord-like bands that connect muscle to bone (tendons) in the rotator cuff. The rotator cuff includes all of the muscles and tendons that connect the arm to the shoulder. The rotator cuff holds the head of the upper arm bone (humerus) in the cup (fossa) of the shoulder blade (scapula). This condition can lead to a long-lasting (chronic) tear. The tear may be partial or complete. What are the causes? This condition is usually caused by overusing the rotator cuff. What increases the risk? This condition is more likely to develop in athletes and workers who frequently use their shoulder or reach over their heads. This can include activities such as:  Tennis.  Baseball or softball.  Swimming.  Construction work.  Painting. What are the signs or symptoms? Symptoms of this condition include:  Pain spreading (radiating) from the shoulder to the upper arm.  Swelling and tenderness in front of the shoulder.  Pain when reaching, pulling, or lifting the arm above the head.  Pain when lowering the arm from above the head.  Minor pain in the shoulder when resting.  Increased pain in the shoulder at night.  Difficulty placing the arm behind the back. How is this diagnosed? This condition is diagnosed with a medical history and physical exam. Tests may also be done, including:  X-rays.  MRI.  Ultrasounds.  CT or MR arthrogram. During this test, a contrast material is injected and then images are taken. How is this treated? Treatment for this condition depends on the severity of the condition. In less severe cases, treatment may include:  Rest. This may be done with a sling that holds the shoulder still (immobilization). Your health care provider may also recommend avoiding activities that involve lifting your arm over your head.  Icing the shoulder.  Anti-inflammatory medicines, such as aspirin or  ibuprofen. In more severe cases, treatment may include:  Physical therapy.  Steroid injections.  Surgery. Follow these instructions at home: If you have a sling:  Wear the sling as told by your health care provider. Remove it only as told by your health care provider.  Loosen the sling if your fingers tingle, become numb, or turn cold and blue.  Keep the sling clean.  If the sling is not waterproof, do not let it get wet. Remove it, if allowed, or cover it with a watertight covering when you take a bath or shower. Managing pain, stiffness, and swelling  If directed, put ice on the injured area. ? If you have a removable sling, remove it as told by your health care provider. ? Put ice in a plastic bag. ? Place a towel between your skin and the bag. ? Leave the ice on for 20 minutes, 2-3 times a day.  Move your fingers often to avoid stiffness and to lessen swelling.  Raise (elevate) the injured area above the level of your heart while you are lying down.  Find a comfortable sleeping position or sleep on a recliner, if available. Driving  Do not drive or use heavy machinery while taking prescription pain medicine.  Ask your health care provider when it is safe to drive if you have a sling on your arm. Activity  Rest your shoulder as told by your health care provider.  Return to your normal activities as told by your health care provider. Ask your health care provider what activities are safe for you.  Do any exercises or stretches as   told by your health care provider.  If you do repetitive overhead tasks, take small breaks in between and include stretching exercises as told by your health care provider. General instructions  Do not use any products that contain nicotine or tobacco, such as cigarettes and e-cigarettes. These can delay healing. If you need help quitting, ask your health care provider.  Take over-the-counter and prescription medicines only as told by your  health care provider.  Keep all follow-up visits as told by your health care provider. This is important. Contact a health care provider if:  Your pain gets worse.  You have new pain in your arm, hands, or fingers.  Your pain is not relieved with medicine or does not get better after 6 weeks of treatment.  You have cracking sensations when moving your shoulder in certain directions.  You hear a snapping sound after using your shoulder, followed by severe pain and weakness. Get help right away if:  Your arm, hand, or fingers are numb or tingling.  Your arm, hand, or fingers are swollen or painful or they turn white or blue. Summary  Rotator cuff tendinitis is inflammation of the tough, cord-like bands that connect muscle to bone (tendons) in the rotator cuff.  This condition is usually caused by overusing the rotator cuff, which includes all of the muscles and tendons that connect the arm to the shoulder.  This condition is more likely to develop in athletes and workers who frequently use their shoulder or reach over their heads.  Treatment generally includes rest, anti-inflammatory medicines, and icing. In some cases, physical therapy and steroid injections may be needed. In severe cases, surgery may be needed. This information is not intended to replace advice given to you by your health care provider. Make sure you discuss any questions you have with your health care provider. Document Released: 06/26/2003 Document Revised: 03/22/2016 Document Reviewed: 03/22/2016 Elsevier Interactive Patient Education  2019 [employer] Inc.  

## 2018-08-29 DIAGNOSIS — R1319 Other dysphagia: Secondary | ICD-10-CM | POA: Diagnosis not present

## 2018-09-04 DIAGNOSIS — B3781 Candidal esophagitis: Secondary | ICD-10-CM | POA: Diagnosis not present

## 2018-09-04 DIAGNOSIS — K621 Rectal polyp: Secondary | ICD-10-CM | POA: Diagnosis not present

## 2018-09-04 DIAGNOSIS — K209 Esophagitis, unspecified: Secondary | ICD-10-CM | POA: Diagnosis not present

## 2018-09-04 DIAGNOSIS — E119 Type 2 diabetes mellitus without complications: Secondary | ICD-10-CM | POA: Diagnosis not present

## 2018-09-04 DIAGNOSIS — R1319 Other dysphagia: Secondary | ICD-10-CM | POA: Diagnosis not present

## 2018-09-04 DIAGNOSIS — Z1211 Encounter for screening for malignant neoplasm of colon: Secondary | ICD-10-CM | POA: Diagnosis not present

## 2018-09-04 DIAGNOSIS — D128 Benign neoplasm of rectum: Secondary | ICD-10-CM | POA: Diagnosis not present

## 2018-09-07 DIAGNOSIS — K209 Esophagitis, unspecified: Secondary | ICD-10-CM | POA: Diagnosis not present

## 2018-09-07 DIAGNOSIS — B3781 Candidal esophagitis: Secondary | ICD-10-CM | POA: Diagnosis not present

## 2018-09-07 DIAGNOSIS — D128 Benign neoplasm of rectum: Secondary | ICD-10-CM | POA: Diagnosis not present

## 2018-09-07 DIAGNOSIS — K5730 Diverticulosis of large intestine without perforation or abscess without bleeding: Secondary | ICD-10-CM | POA: Diagnosis not present

## 2018-09-22 DIAGNOSIS — K648 Other hemorrhoids: Secondary | ICD-10-CM | POA: Diagnosis not present

## 2018-09-27 ENCOUNTER — Other Ambulatory Visit: Payer: Self-pay

## 2018-09-29 ENCOUNTER — Encounter: Payer: Self-pay | Admitting: Endocrinology

## 2018-09-29 ENCOUNTER — Ambulatory Visit: Payer: BC Managed Care – PPO | Admitting: Endocrinology

## 2018-09-29 ENCOUNTER — Other Ambulatory Visit: Payer: Self-pay

## 2018-09-29 VITALS — BP 132/84 | HR 106 | Temp 98.2°F | Wt 220.0 lb

## 2018-09-29 DIAGNOSIS — E1169 Type 2 diabetes mellitus with other specified complication: Secondary | ICD-10-CM | POA: Diagnosis not present

## 2018-09-29 DIAGNOSIS — E785 Hyperlipidemia, unspecified: Secondary | ICD-10-CM

## 2018-09-29 LAB — POCT GLYCOSYLATED HEMOGLOBIN (HGB A1C): Hemoglobin A1C: 9.3 % — AB (ref 4.0–5.6)

## 2018-09-29 MED ORDER — FARXIGA 5 MG PO TABS: 5.0000 mg | ORAL_TABLET | Freq: Every day | ORAL | 3 refills | Status: DC

## 2018-09-29 MED ORDER — CONTOUR TEST VI STRP: 1.0000 | ORAL_STRIP | Freq: Every day | 12 refills | Status: DC

## 2018-09-29 MED ORDER — GLIMEPIRIDE 2 MG PO TABS: 2.0000 mg | ORAL_TABLET | Freq: Every day | ORAL | 3 refills | Status: DC

## 2018-09-29 NOTE — Patient Instructions (Addendum)
I have sent a prescription to your pharmacy, to add farxiga and glimepiride.   check your blood sugar once a day.  vary the time of day when you check, between before the 3 meals, and at bedtime.  also check if you have symptoms of your blood sugar being too high or too low.  please keep a record of the readings and bring it to your next appointment here (or you can bring the meter itself).  You can write it on any piece of paper.  please call us sooner if your blood sugar goes below 70, or if you have a lot of readings over 200. Please come back for a follow-up appointment in 2 months.    Diabetes Mellitus and Nutrition, Adult When you have diabetes (diabetes mellitus), it is very important to have healthy eating habits because your blood sugar (glucose) levels are greatly affected by what you eat and drink. Eating healthy foods in the appropriate amounts, at about the same times every day, can help you:  Control your blood glucose.  Lower your risk of heart disease.  Improve your blood pressure.  Reach or maintain a healthy weight. Every person with diabetes is different, and each person has different needs for a meal plan. Your health care provider may recommend that you work with a diet and nutrition specialist (dietitian) to make a meal plan that is best for you. Your meal plan may vary depending on factors such as:  The calories you need.  The medicines you take.  Your weight.  Your blood glucose, blood pressure, and cholesterol levels.  Your activity level.  Other health conditions you have, such as heart or kidney disease. How do carbohydrates affect me? Carbohydrates, also called carbs, affect your blood glucose level more than any other type of food. Eating carbs naturally raises the amount of glucose in your blood. Carb counting is a method for keeping track of how many carbs you eat. Counting carbs is important to keep your blood glucose at a healthy level, especially if you  use insulin or take certain oral diabetes medicines. It is important to know how many carbs you can safely have in each meal. This is different for every person. Your dietitian can help you calculate how many carbs you should have at each meal and for each snack. Foods that contain carbs include:  Bread, cereal, rice, pasta, and crackers.  Potatoes and corn.  Peas, beans, and lentils.  Milk and yogurt.  Fruit and juice.  Desserts, such as cakes, cookies, ice cream, and candy. How does alcohol affect me? Alcohol can cause a sudden decrease in blood glucose (hypoglycemia), especially if you use insulin or take certain oral diabetes medicines. Hypoglycemia can be a life-threatening condition. Symptoms of hypoglycemia (sleepiness, dizziness, and confusion) are similar to symptoms of having too much alcohol. If your health care provider says that alcohol is safe for you, follow these guidelines:  Limit alcohol intake to no more than 1 drink per day for nonpregnant women and 2 drinks per day for men. One drink equals 12 oz of beer, 5 oz of wine, or 1 oz of hard liquor.  Do not drink on an empty stomach.  Keep yourself hydrated with water, diet soda, or unsweetened iced tea.  Keep in mind that regular soda, juice, and other mixers may contain a lot of sugar and must be counted as carbs. What are tips for following this plan?  Reading food labels  Start by checking  the serving size on the "Nutrition Facts" label of packaged foods and drinks. The amount of calories, carbs, fats, and other nutrients listed on the label is based on one serving of the item. Many items contain more than one serving per package.  Check the total grams (g) of carbs in one serving. You can calculate the number of servings of carbs in one serving by dividing the total carbs by 15. For example, if a food has 30 g of total carbs, it would be equal to 2 servings of carbs.  Check the number of grams (g) of saturated  and trans fats in one serving. Choose foods that have low or no amount of these fats.  Check the number of milligrams (mg) of salt (sodium) in one serving. Most people should limit total sodium intake to less than 2,300 mg per day.  Always check the nutrition information of foods labeled as "low-fat" or "nonfat". These foods may be higher in added sugar or refined carbs and should be avoided.  Talk to your dietitian to identify your daily goals for nutrients listed on the label. Shopping  Avoid buying canned, premade, or processed foods. These foods tend to be high in fat, sodium, and added sugar.  Shop around the outside edge of the grocery store. This includes fresh fruits and vegetables, bulk grains, fresh meats, and fresh dairy. Cooking  Use low-heat cooking methods, such as baking, instead of high-heat cooking methods like deep frying.  Cook using healthy oils, such as olive, canola, or sunflower oil.  Avoid cooking with butter, cream, or high-fat meats. Meal planning  Eat meals and snacks regularly, preferably at the same times every day. Avoid going long periods of time without eating.  Eat foods high in fiber, such as fresh fruits, vegetables, beans, and whole grains. Talk to your dietitian about how many servings of carbs you can eat at each meal.  Eat 4-6 ounces (oz) of lean protein each day, such as lean meat, chicken, fish, eggs, or tofu. One oz of lean protein is equal to: ? 1 oz of meat, chicken, or fish. ? 1 egg. ?  cup of tofu.  Eat some foods each day that contain healthy fats, such as avocado, nuts, seeds, and fish. Lifestyle  Check your blood glucose regularly.  Exercise regularly as told by your health care provider. This may include: ? 150 minutes of moderate-intensity or vigorous-intensity exercise each week. This could be brisk walking, biking, or water aerobics. ? Stretching and doing strength exercises, such as yoga or weightlifting, at least 2 times a  week.  Take medicines as told by your health care provider.  Do not use any products that contain nicotine or tobacco, such as cigarettes and e-cigarettes. If you need help quitting, ask your health care provider.  Work with a Social worker or diabetes educator to identify strategies to manage stress and any emotional and social challenges. Questions to ask a health care provider  Do I need to meet with a diabetes educator?  Do I need to meet with a dietitian?  What number can I call if I have questions?  When are the best times to check my blood glucose? Where to find more information:  American Diabetes Association: diabetes.org  Academy of Nutrition and Dietetics: www.eatright.[employer] of Diabetes and Digestive and Kidney Diseases (NIH): [employer] Summary  A healthy meal plan will help you control your blood glucose and maintain a healthy lifestyle.  Working with a diet and  nutrition specialist (dietitian) can help you make a meal plan that is best for you.  Keep in mind that carbohydrates (carbs) and alcohol have immediate effects on your blood glucose levels. It is important to count carbs and to use alcohol carefully. This information is not intended to replace advice given to you by your health care provider. Make sure you discuss any questions you have with your health care provider. Document Released: 12/31/2004 Document Revised: 11/03/2016 Document Reviewed: 05/10/2016 Elsevier Interactive Patient Education  2019 [employer]

## 2018-09-29 NOTE — Progress Notes (Signed)
Subjective:    Patient ID: [name], male    DOB: July 22, 1960, 58 y.o.   MRN: [id number]  HPI Pt returns for f/u of diabetes mellitus: DM type: 2 (but neg FHx suggests he may be evolving type 1).  Dx'ed: 8891 Complications: none Therapy: 2 oral meds DKA: never Severe hypoglycemia: never Pancreatitis: never Pancreatic imaging: normal on 2008 CT Other: he has never been on insulin, except in the hospital; he is a trucker; edema limits x options Interval history: He does not check cbg's.  pt states he feels well in general.  He takes meds as rx'ed Past Medical History:  Diagnosis Date  . Allergy   . Cervical radiculopathy 08/09/2017  . Diabetes mellitus without complication ([place])   . Stroke (cerebrum) ([place]) 08/03/2017  . Type 2 diabetes mellitus with hyperlipidemia [facility])     Past Surgical History:  Procedure Laterality Date  . ESOPHAGOGASTRODUODENOSCOPY (EGD) WITH PROPOFOL N/A 05/09/2015   Procedure: ESOPHAGOGASTRODUODENOSCOPY (EGD) WITH PROPOFOL;  Surgeon: Gatha Mayer, MD;  Location: [facility] ENDOSCOPY;  Service: Endoscopy;  Laterality: N/A;  with esophageal dilation  . LASER ABLATION Left 02/03/15  . VARICOSE VEIN SURGERY      Social History   Socioeconomic History  . Marital status: Married    Spouse name: Not on file  . Number of children: Not on file  . Years of education: Not on file  . Highest education level: Not on file  Occupational History  . Not on file  Social Needs  . Financial resource strain: Not on file  . Food insecurity    Worry: Not on file    Inability: Not on file  . Transportation needs    Medical: Not on file    Non-medical: Not on file  Tobacco Use  . Smoking status: Never Smoker  . Smokeless tobacco: Never Used  Substance and Sexual Activity  . Alcohol use: No    Alcohol/week: 0.0 standard drinks  . Drug use: No  . Sexual activity: Never  Lifestyle  . Physical activity    Days per week: Not on file    Minutes per session: Not on  file  . Stress: Not on file  Relationships  . Social Herbalist on phone: Not on file    Gets together: Not on file    Attends religious service: Not on file    Active member of club or organization: Not on file    Attends meetings of clubs or organizations: Not on file    Relationship status: Not on file  . Intimate partner violence    Fear of current or ex partner: Not on file    Emotionally abused: Not on file    Physically abused: Not on file    Forced sexual activity: Not on file  Other Topics Concern  . Not on file  Social History Narrative  . Not on file    Current Outpatient Medications on File Prior to Visit  Medication Sig Dispense Refill  . Blood Glucose Monitoring Suppl (CONTOUR BLOOD GLUCOSE SYSTEM) w/Device KIT 1 each by Does not apply route daily. Use device to monitor glucose levels daily; E11.69; Please provide with Contour device that is covered by pt insurance 1 each each  . Chromium-Cinnamon (CINNAMON PLUS CHROMIUM) [id number] MCG-MG CAPS Take 1,000 mg by mouth daily.    Water engineer Bandages & Supports (MEDICAL COMPRESSION THIGH HIGH) MISC 20-76mHG 1 each 0  . fluconazole (DIFLUCAN) 100 MG tablet Take 100  mg by mouth daily.    Marland Kitchen ibuprofen (ADVIL,MOTRIN) 800 MG tablet Take 800 mg by mouth every 8 (eight) hours as needed.    . metFORMIN (GLUCOPHAGE-XR) 500 MG 24 hr tablet Take 4 tablets (2,000 mg total) by mouth daily. 360 tablet 3  . MICROLET LANCETS MISC 1 each by Does not apply route daily. Use to monitor glucose levels daily; E11.69; Please provide lancets compatible for Contour device that is covered by pt insurance 100 each 3  . Multiple Vitamin (MULTIVITAMIN) tablet Take 1 tablet by mouth daily.    . Probiotic Product (ALIGN PO) Take 1 capsule by mouth daily.    . saxagliptin HCl (ONGLYZA) 5 MG TABS tablet Take 1 tablet (5 mg total) by mouth daily. 90 tablet 3  . testosterone cypionate (DEPOTESTOSTERONE CYPIONATE) 200 MG/ML injection Inject 200 mg into  the muscle once a week.     No current facility-administered medications on file prior to visit.     Allergies  Allergen Reactions  . Latex Rash    Family History  Problem Relation Age of Onset  . Parkinson's disease Father   . Heart attack Maternal Grandmother   . Diabetes Neg Hx     BP 132/84 (BP Location: Right Arm, Patient Position: Sitting, Cuff Size: Normal)   Pulse (!) 106   Temp 98.2 F (36.8 C) (Oral)   Wt 220 lb (99.8 kg)   SpO2 95%   BMI 27.50 kg/m   Review of Systems He has lost 10 lbs since last ov here    Objective:   Physical Exam VITAL SIGNS:  See vs page GENERAL: no distress Pulses: dorsalis pedis intact bilat.   MSK: no deformity of the feet CV: trace bilat leg edema, and bilat vv's Skin:  no ulcer on the feet.  normal color and temp on the feet. Neuro: sensation is intact to touch on the feet Ext: there is bilateral onychomycosis of the toenails.     Lab Results  Component Value Date   HGBA1C 9.3 (A) 09/29/2018       Assessment & Plan:  Type 2 DM: he needs increased rx Occupational status: he needs to control DM without insulin. Edema: This limits rx options   Patient Instructions  I have sent a prescription to your pharmacy, to add farxiga and glimepiride.   check your blood sugar once a day.  vary the time of day when you check, between before the 3 meals, and at bedtime.  also check if you have symptoms of your blood sugar being too high or too low.  please keep a record of the readings and bring it to your next appointment here (or you can bring the meter itself).  You can write it on any piece of paper.  please call us sooner if your blood sugar goes below 70, or if you have a lot of readings over 200. Please come back for a follow-up appointment in 2 months.    Diabetes Mellitus and Nutrition, Adult When you have diabetes (diabetes mellitus), it is very important to have healthy eating habits because your blood sugar (glucose)  levels are greatly affected by what you eat and drink. Eating healthy foods in the appropriate amounts, at about the same times every day, can help you:  Control your blood glucose.  Lower your risk of heart disease.  Improve your blood pressure.  Reach or maintain a healthy weight. Every person with diabetes is different, and each person has different needs for  a meal plan. Your health care provider may recommend that you work with a diet and nutrition specialist (dietitian) to make a meal plan that is best for you. Your meal plan may vary depending on factors such as:  The calories you need.  The medicines you take.  Your weight.  Your blood glucose, blood pressure, and cholesterol levels.  Your activity level.  Other health conditions you have, such as heart or kidney disease. How do carbohydrates affect me? Carbohydrates, also called carbs, affect your blood glucose level more than any other type of food. Eating carbs naturally raises the amount of glucose in your blood. Carb counting is a method for keeping track of how many carbs you eat. Counting carbs is important to keep your blood glucose at a healthy level, especially if you use insulin or take certain oral diabetes medicines. It is important to know how many carbs you can safely have in each meal. This is different for every person. Your dietitian can help you calculate how many carbs you should have at each meal and for each snack. Foods that contain carbs include:  Bread, cereal, rice, pasta, and crackers.  Potatoes and corn.  Peas, beans, and lentils.  Milk and yogurt.  Fruit and juice.  Desserts, such as cakes, cookies, ice cream, and candy. How does alcohol affect me? Alcohol can cause a sudden decrease in blood glucose (hypoglycemia), especially if you use insulin or take certain oral diabetes medicines. Hypoglycemia can be a life-threatening condition. Symptoms of hypoglycemia (sleepiness, dizziness, and  confusion) are similar to symptoms of having too much alcohol. If your health care provider says that alcohol is safe for you, follow these guidelines:  Limit alcohol intake to no more than 1 drink per day for nonpregnant women and 2 drinks per day for men. One drink equals 12 oz of beer, 5 oz of wine, or 1 oz of hard liquor.  Do not drink on an empty stomach.  Keep yourself hydrated with water, diet soda, or unsweetened iced tea.  Keep in mind that regular soda, juice, and other mixers may contain a lot of sugar and must be counted as carbs. What are tips for following this plan?  Reading food labels  Start by checking the serving size on the "Nutrition Facts" label of packaged foods and drinks. The amount of calories, carbs, fats, and other nutrients listed on the label is based on one serving of the item. Many items contain more than one serving per package.  Check the total grams (g) of carbs in one serving. You can calculate the number of servings of carbs in one serving by dividing the total carbs by 15. For example, if a food has 30 g of total carbs, it would be equal to 2 servings of carbs.  Check the number of grams (g) of saturated and trans fats in one serving. Choose foods that have low or no amount of these fats.  Check the number of milligrams (mg) of salt (sodium) in one serving. Most people should limit total sodium intake to less than 2,300 mg per day.  Always check the nutrition information of foods labeled as "low-fat" or "nonfat". These foods may be higher in added sugar or refined carbs and should be avoided.  Talk to your dietitian to identify your daily goals for nutrients listed on the label. Shopping  Avoid buying canned, premade, or processed foods. These foods tend to be high in fat, sodium, and added sugar.  Shop  around the outside edge of the grocery store. This includes fresh fruits and vegetables, bulk grains, fresh meats, and fresh dairy. Cooking  Use  low-heat cooking methods, such as baking, instead of high-heat cooking methods like deep frying.  Cook using healthy oils, such as olive, canola, or sunflower oil.  Avoid cooking with butter, cream, or high-fat meats. Meal planning  Eat meals and snacks regularly, preferably at the same times every day. Avoid going long periods of time without eating.  Eat foods high in fiber, such as fresh fruits, vegetables, beans, and whole grains. Talk to your dietitian about how many servings of carbs you can eat at each meal.  Eat 4-6 ounces (oz) of lean protein each day, such as lean meat, chicken, fish, eggs, or tofu. One oz of lean protein is equal to: ? 1 oz of meat, chicken, or fish. ? 1 egg. ?  cup of tofu.  Eat some foods each day that contain healthy fats, such as avocado, nuts, seeds, and fish. Lifestyle  Check your blood glucose regularly.  Exercise regularly as told by your health care provider. This may include: ? 150 minutes of moderate-intensity or vigorous-intensity exercise each week. This could be brisk walking, biking, or water aerobics. ? Stretching and doing strength exercises, such as yoga or weightlifting, at least 2 times a week.  Take medicines as told by your health care provider.  Do not use any products that contain nicotine or tobacco, such as cigarettes and e-cigarettes. If you need help quitting, ask your health care provider.  Work with a Social worker or diabetes educator to identify strategies to manage stress and any emotional and social challenges. Questions to ask a health care provider  Do I need to meet with a diabetes educator?  Do I need to meet with a dietitian?  What number can I call if I have questions?  When are the best times to check my blood glucose? Where to find more information:  American Diabetes Association: diabetes.org  Academy of Nutrition and Dietetics: www.eatright.[employer] of Diabetes and Digestive and Kidney  Diseases (NIH): [employer] Summary  A healthy meal plan will help you control your blood glucose and maintain a healthy lifestyle.  Working with a diet and nutrition specialist (dietitian) can help you make a meal plan that is best for you.  Keep in mind that carbohydrates (carbs) and alcohol have immediate effects on your blood glucose levels. It is important to count carbs and to use alcohol carefully. This information is not intended to replace advice given to you by your health care provider. Make sure you discuss any questions you have with your health care provider. Document Released: 12/31/2004 Document Revised: 11/03/2016 Document Reviewed: 05/10/2016 Elsevier Interactive Patient Education  2019 [employer].

## 2018-10-03 DIAGNOSIS — E291 Testicular hypofunction: Secondary | ICD-10-CM | POA: Diagnosis not present

## 2018-10-06 DIAGNOSIS — K648 Other hemorrhoids: Secondary | ICD-10-CM | POA: Diagnosis not present

## 2018-10-10 DIAGNOSIS — E291 Testicular hypofunction: Secondary | ICD-10-CM | POA: Diagnosis not present

## 2018-10-10 DIAGNOSIS — R972 Elevated prostate specific antigen [PSA]: Secondary | ICD-10-CM | POA: Diagnosis not present

## 2018-10-27 DIAGNOSIS — K648 Other hemorrhoids: Secondary | ICD-10-CM | POA: Diagnosis not present

## 2018-11-13 DIAGNOSIS — E291 Testicular hypofunction: Secondary | ICD-10-CM | POA: Diagnosis not present

## 2018-12-08 ENCOUNTER — Ambulatory Visit: Payer: BC Managed Care – PPO | Admitting: Endocrinology

## 2018-12-15 DIAGNOSIS — E291 Testicular hypofunction: Secondary | ICD-10-CM | POA: Diagnosis not present

## 2018-12-22 ENCOUNTER — Telehealth: Payer: Self-pay | Admitting: Family Medicine

## 2018-12-22 NOTE — Telephone Encounter (Signed)
Patient wife was in the office and wanted to know if you received records from last office visit with eye doctor. Patient wife stated that the eye doctor did not find any signs of dm. Patient wife would like to know if he is not a diabetic, should he still be taking metformin. Please contact patient to discuss further.

## 2018-12-28 NOTE — Telephone Encounter (Signed)
Dr. Zigmund Daniel, please advise if pt should continue taking the metformin.

## 2018-12-29 NOTE — Telephone Encounter (Signed)
I called and spoke with pt. We went over information below and he will restart his metformin. Nothing further needed at this time.

## 2018-12-29 NOTE — Telephone Encounter (Signed)
I haven't seen records yet.  It is good that it sounds like diabetes is not affecting his eyes but this doesn't mean he does not have diabetes.  He should continue metformin.

## 2019-03-06 DIAGNOSIS — E291 Testicular hypofunction: Secondary | ICD-10-CM | POA: Diagnosis not present

## 2019-03-06 DIAGNOSIS — Z125 Encounter for screening for malignant neoplasm of prostate: Secondary | ICD-10-CM | POA: Diagnosis not present

## 2019-04-02 DIAGNOSIS — E291 Testicular hypofunction: Secondary | ICD-10-CM | POA: Diagnosis not present

## 2019-04-02 DIAGNOSIS — N5201 Erectile dysfunction due to arterial insufficiency: Secondary | ICD-10-CM | POA: Diagnosis not present

## 2019-04-08 IMAGING — CT CT CERVICAL SPINE W/O CM
5 series · 14 of 33 positions shown, 16 images · non-contrast
Comparison: Intraoperative image [id number]. Preoperative cervical spine
MRI 08/17/2017.

CLINICAL DATA: 56-year-old male status post ACDF on 09/16/2017
(postoperative day 6). Anterior neck swelling at surgical site,
dysphagia. Left arm and hand numbness.

EXAM:
CT CERVICAL SPINE WITHOUT CONTRAST
TECHNIQUE: Multidetector CT imaging of the cervical spine was performed without
intravenous contrast. Multiplanar CT image reconstructions were also
generated.

[Series 3: c-spine 2.00 br60 s3 axial bone · axial · 0.48mm/px · z∈[-765,-665]mm · 3 of 101 slices shown]
[im 26/101  bone]
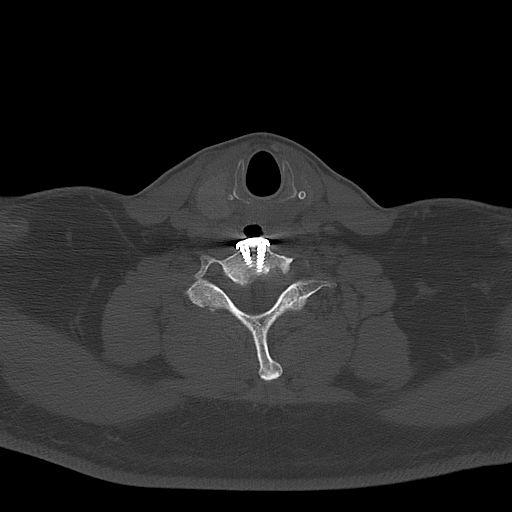
[im 51/101  bone]
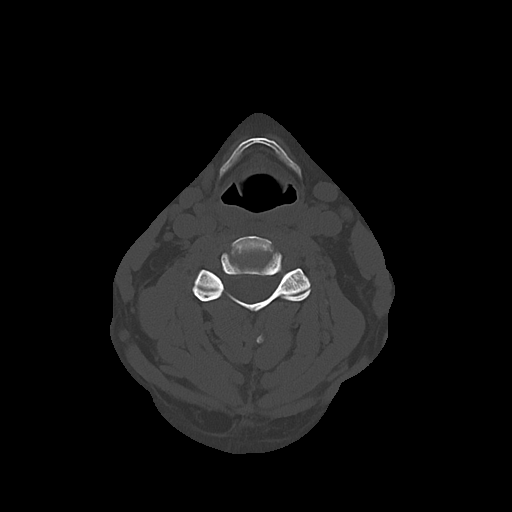
[im 76/101  bone]
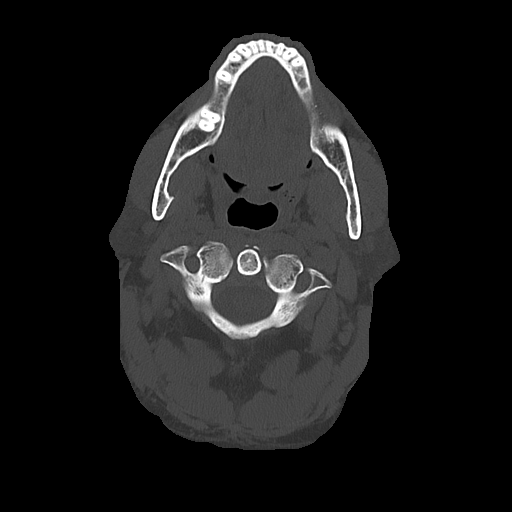

[Series 4: c-spine 2.00 br40 s3 axial (person_name) · axial · 0.48mm/px · 1 of 101 slices shown]
[im 26/101  bone]
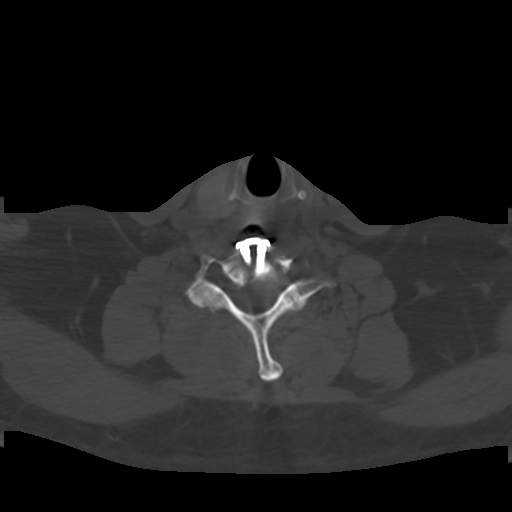

[Series 5: c-spine 2.00 br60 s3 sag sag bone · sagittal · 0.39mm/px · 5 of 121 slices shown, 6 images]
[im 41/121  bone]
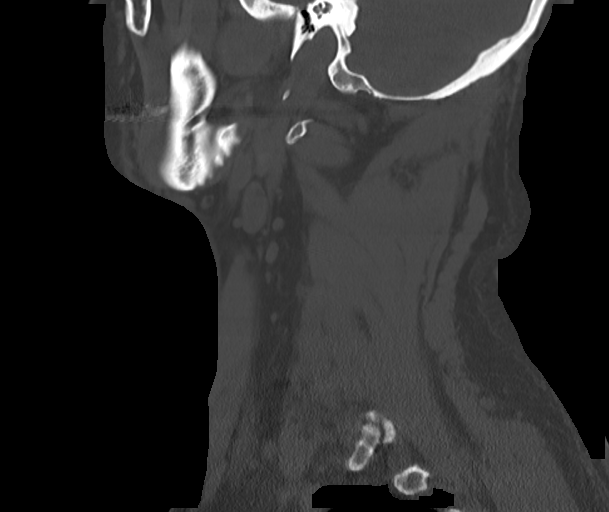
[im 51/121  bone]
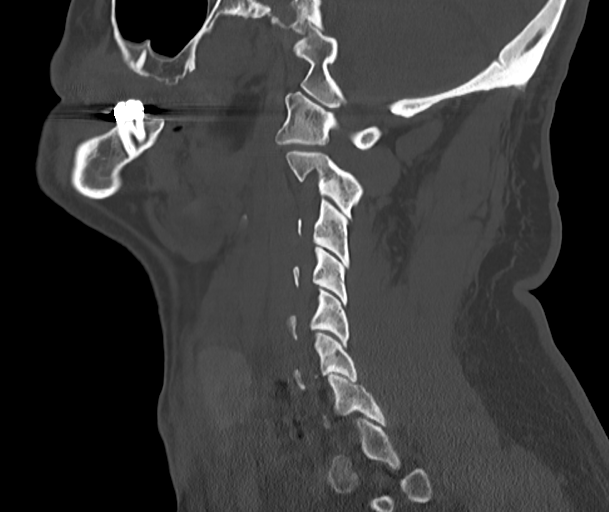
[im 61/121  soft-tissue]
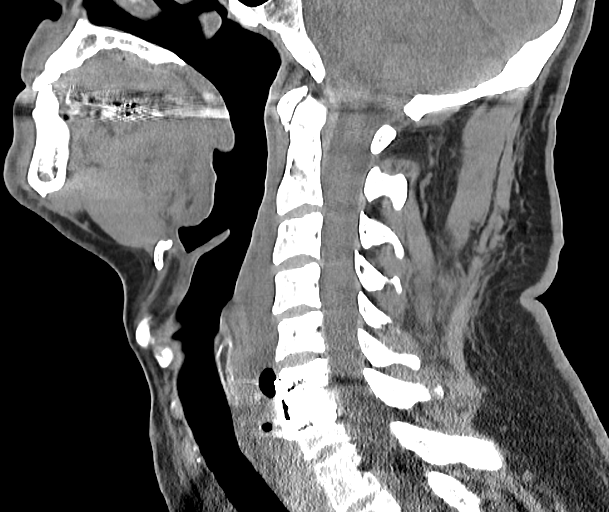
[im 61/121  bone]
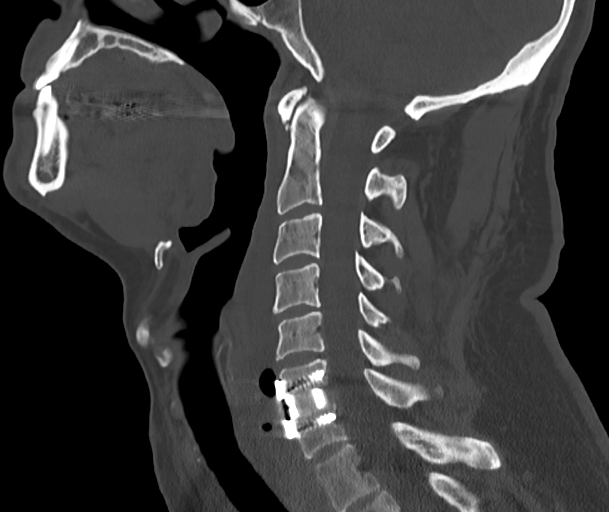
[im 71/121  bone]
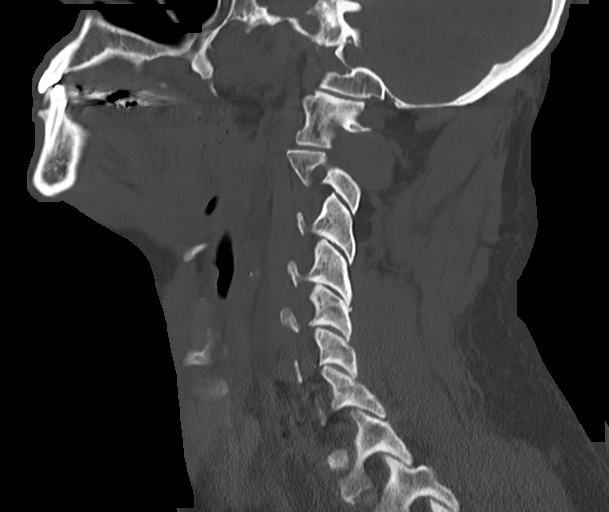
[im 81/121  bone]
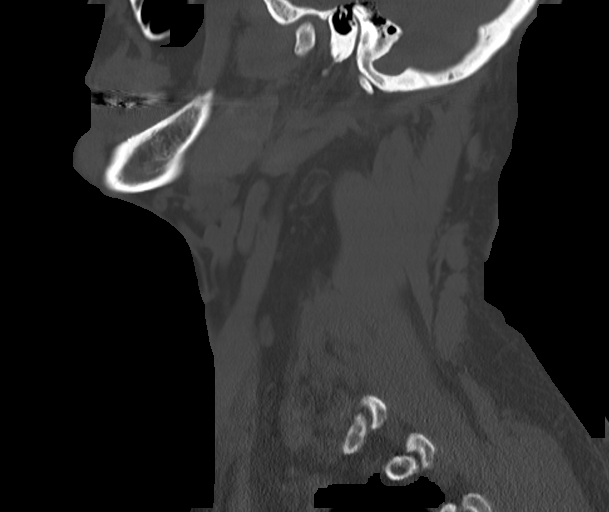

[Series 7: c-spine 2.00 hr60 s3 cor cor bone · coronal · 0.39mm/px · 3 of 119 slices shown]
[im 24/119  bone]
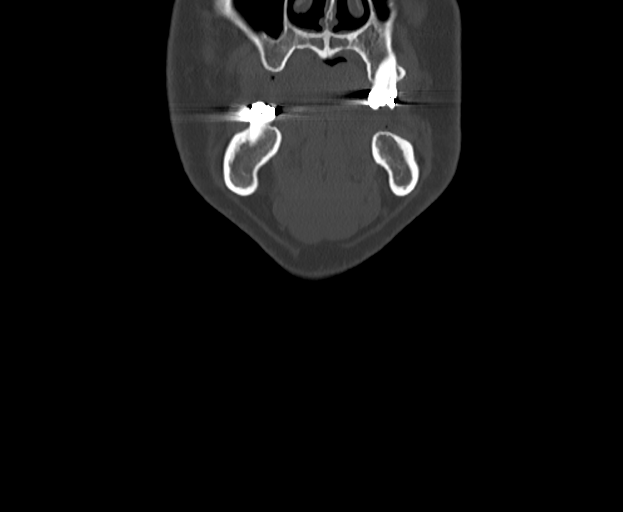
[im 48/119  bone]
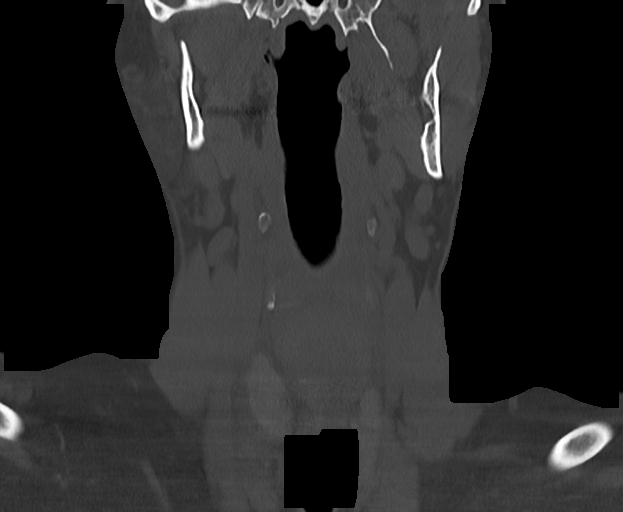
[im 71/119  bone]
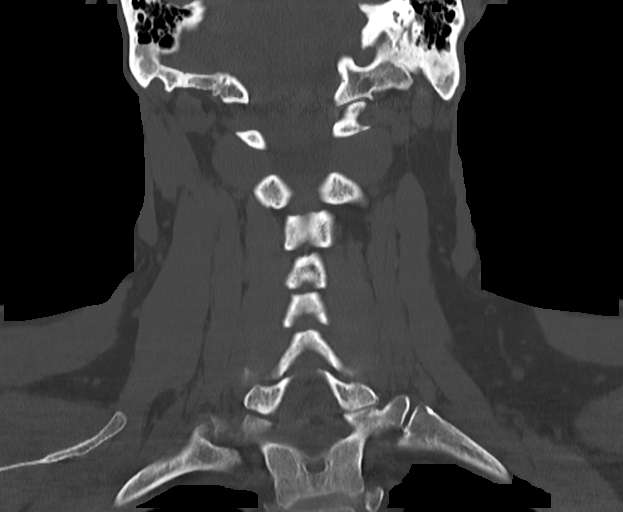

[Series 10: c-spine 2.00 hr60 s3 axial orthogonal axial · axial · 0.31mm/px · z∈[-775,-703]mm · 2 of 88 slices shown, 3 images]
[im 30/88  soft-tissue]
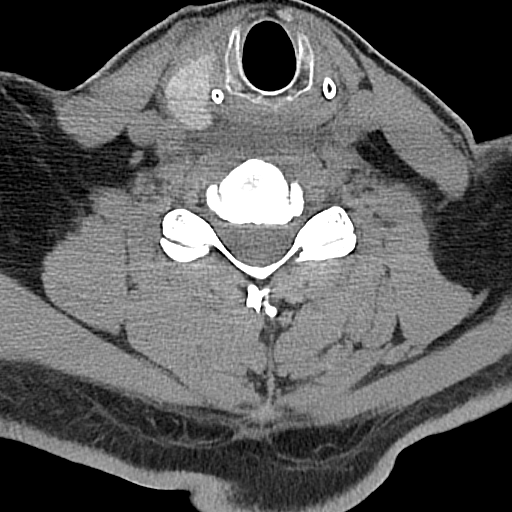
[im 30/88  bone]
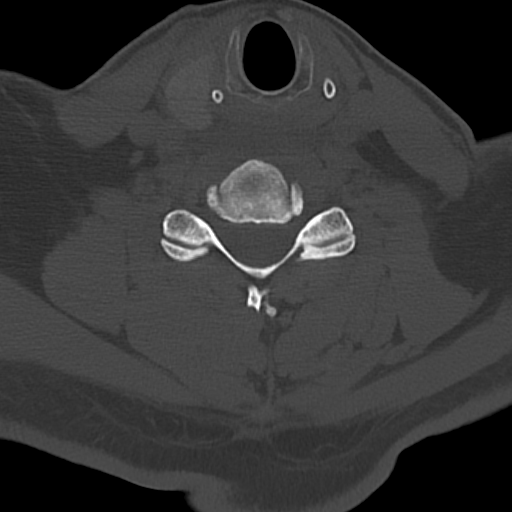
[im 59/88  bone]
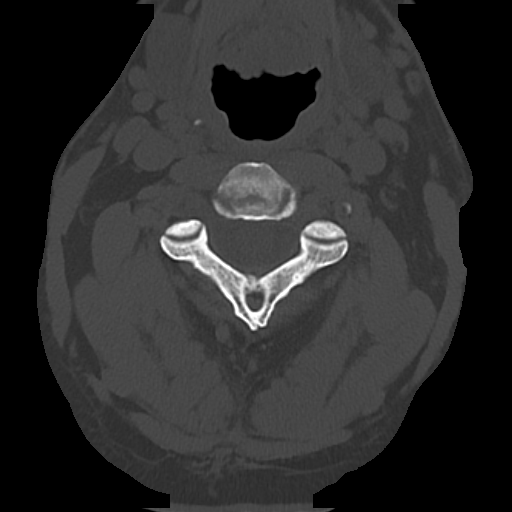

[14 of 33 positions shown; findings below may reference images not displayed]

FINDINGS: Alignment: Stable vertebral height and alignment. Cervicothoracic
junction alignment is within normal limits. Bilateral posterior
element alignment is within normal limits.

Skull base and vertebrae: Postoperative changes with hardware at
C6-C7, detailed below. No acute osseous abnormality identified.
Congenital or chronic flattening of the occipital condyles.

Soft tissues and spinal canal: Prevertebral or retropharyngeal
intermediate density collection, up to 9 millimeters in thickness,
with a small volume of gas superimposed anterior to the C6 and C7
vertebral bodies. Generally mild mass effect on the posterior wall
of the pharynx. Mild asymmetric soft tissue stranding and swelling
along the right aspect of the thyroid likely corresponding to the
surgical trajectory on series 4, image 88. Similar intermediate
density tracks along the right lateral aspect of the cervical
esophagus and tapers to the thoracic inlet.

No other soft tissue gas. Other neck soft tissue spaces appear
normal. Negative visible noncontrast brain parenchyma.

Disc levels:

The C2-C3 through C5-C6 levels appear stable to the preoperative
MRI.

C6-C7: ACDF hardware in place with no evidence of loosening or
adverse features. Residual mild endplate spurring eccentric to the
left.

C7-T1:  Stable and negative aside from mild facet hypertrophy.

Upper chest: Upper thoracic levels appear intact. Negative lung
apices.

Other: Visualized paranasal sinuses and mastoids are well
pneumatized.
IMPRESSION: 1. Status post C6-C7 ACDF with no adverse hardware features.
- Postoperative seroma suspected in the prevertebral soft tissue
space, up to 9 mm in thickness but with generally mild mass effect
on the pharynx.
- A small volume of superimposed gas anterior to the C6 and C7
vertebral bodies is felt to be acceptable on postoperative day 6.
- Soft tissue stranding and swelling along the right thyroid and
lateral to the right cervical esophagus compatible with mild
postoperative hematoma.
2. Otherwise stable cervical spine compared to the preoperative MRI.

## 2019-04-11 DIAGNOSIS — E291 Testicular hypofunction: Secondary | ICD-10-CM | POA: Diagnosis not present

## 2019-04-16 DIAGNOSIS — Z79899 Other long term (current) drug therapy: Secondary | ICD-10-CM | POA: Diagnosis not present

## 2019-04-16 DIAGNOSIS — K644 Residual hemorrhoidal skin tags: Secondary | ICD-10-CM | POA: Diagnosis not present

## 2019-06-13 ENCOUNTER — Ambulatory Visit: Payer: BC Managed Care – PPO | Admitting: Family Medicine

## 2019-06-13 ENCOUNTER — Other Ambulatory Visit: Payer: Self-pay | Admitting: Endocrinology

## 2019-06-13 ENCOUNTER — Telehealth (INDEPENDENT_AMBULATORY_CARE_PROVIDER_SITE_OTHER): Payer: BC Managed Care – PPO | Admitting: Family Medicine

## 2019-06-13 ENCOUNTER — Encounter: Payer: Self-pay | Admitting: Family Medicine

## 2019-06-13 DIAGNOSIS — Z20822 Contact with and (suspected) exposure to covid-19: Secondary | ICD-10-CM | POA: Diagnosis not present

## 2019-06-13 DIAGNOSIS — B029 Zoster without complications: Secondary | ICD-10-CM

## 2019-06-13 MED ORDER — VALACYCLOVIR HCL 1 G PO TABS: 1000.0000 mg | ORAL_TABLET | Freq: Three times a day (TID) | ORAL | 0 refills | Status: AC

## 2019-06-13 NOTE — Progress Notes (Signed)
[name] - 59 y.o. male MRN [id number]  Date of birth: [birth year]   This visit type was conducted due to national recommendations for restrictions regarding the COVID-19 Pandemic (e.g. social distancing).  This format is felt to be most appropriate for this patient at this time.  All issues noted in this document were discussed and addressed.  No physical exam was performed (except for noted visual exam findings with Video Visits).  I discussed the limitations of evaluation and management by telemedicine and the availability of in person appointments. The patient expressed understanding and agreed to proceed.  I connected with@ on 06/13/19 at  4:20 PM EST by a video enabled telemedicine application and verified that I am speaking with the correct person using two identifiers.  Present at visit: Luetta Nutting, DO Tawanna Solo   Patient Location: Home [address] [place] [address]   Provider location:   [facility]  Chief Complaint  Patient presents with  . Rash    HPI  [name] is a 59 y.o. male who presents via audio/video conferencing for a telehealth visit today.  He has complaint of rash.  Rash is located on left chest wall radiating from side of chest to front of chest wall.  There are fluid filled bumps along rash.  Rash is painful, describes as burning sensation.  Also feels like somebody "punched him in the ribs"  He has tried tea tree oil and lavender without relief.  He denies fever or chills.   He also reports exposure to COVID at work.  Denies symptoms.  Has test schedule for later this week.    ROS:  A comprehensive ROS was completed and negative except as noted per HPI  Past Medical History:  Diagnosis Date  . Allergy   . Cervical radiculopathy 08/09/2017  . Diabetes mellitus without complication ([place])   . Stroke (cerebrum) ([place]) 08/03/2017  . Type 2 diabetes mellitus with hyperlipidemia [facility])     Past Surgical History:  Procedure Laterality  Date  . ESOPHAGOGASTRODUODENOSCOPY (EGD) WITH PROPOFOL N/A 05/09/2015   Procedure: ESOPHAGOGASTRODUODENOSCOPY (EGD) WITH PROPOFOL;  Surgeon: Gatha Mayer, MD;  Location: [facility];  Service: Endoscopy;  Laterality: N/A;  with esophageal dilation  . LASER ABLATION Left 02/03/15  . VARICOSE VEIN SURGERY      Family History  Problem Relation Age of Onset  . Parkinson's disease Father   . Heart attack Maternal Grandmother   . Diabetes Neg Hx     Social History   Socioeconomic History  . Marital status: Married    Spouse name: Not on file  . Number of children: Not on file  . Years of education: Not on file  . Highest education level: Not on file  Occupational History  . Not on file  Tobacco Use  . Smoking status: Never Smoker  . Smokeless tobacco: Never Used  Substance and Sexual Activity  . Alcohol use: No    Alcohol/week: 0.0 standard drinks  . Drug use: No  . Sexual activity: Never  Other Topics Concern  . Not on file  Social History Narrative  . Not on file   Social Determinants of Health   Financial Resource Strain:   . Difficulty of Paying Living Expenses: Not on file  Food Insecurity:   . Worried About Charity fundraiser in the Last Year: Not on file  . Ran Out of Food in the Last Year: Not on file  Transportation Needs:   .  Lack of Transportation (Medical): Not on file  . Lack of Transportation (Non-Medical): Not on file  Physical Activity:   . Days of Exercise per Week: Not on file  . Minutes of Exercise per Session: Not on file  Stress:   . Feeling of Stress : Not on file  Social Connections:   . Frequency of Communication with Friends and Family: Not on file  . Frequency of Social Gatherings with Friends and Family: Not on file  . Attends Religious Services: Not on file  . Active Member of Clubs or Organizations: Not on file  . Attends Archivist Meetings: Not on file  . Marital Status: Not on file  Intimate Partner Violence:   . Fear  of Current or Ex-Partner: Not on file  . Emotionally Abused: Not on file  . Physically Abused: Not on file  . Sexually Abused: Not on file     Current Outpatient Medications:  .  Blood Glucose Monitoring Suppl (CONTOUR BLOOD GLUCOSE SYSTEM) w/Device KIT, 1 each by Does not apply route daily. Use device to monitor glucose levels daily; E11.69; Please provide with Contour device that is covered by pt insurance, Disp: 1 each, Rfl: each .  Chromium-Cinnamon (CINNAMON PLUS CHROMIUM) [phone] MCG-MG CAPS, Take 1,000 mg by mouth daily., Disp: , Rfl:  .  dapagliflozin propanediol (FARXIGA) 5 MG TABS tablet, Take 5 mg by mouth daily., Disp: 90 tablet, Rfl: 3 .  glucose blood (CONTOUR TEST) test strip, 1 each by Other route daily. And lancets 1/day, Disp: 100 each, Rfl: 12 .  ibuprofen (ADVIL,MOTRIN) 800 MG tablet, Take 800 mg by mouth every 8 (eight) hours as needed., Disp: , Rfl:  .  MICROLET LANCETS MISC, 1 each by Does not apply route daily. Use to monitor glucose levels daily; E11.69; Please provide lancets compatible for Contour device that is covered by pt insurance, Disp: 100 each, Rfl: 3 .  Multiple Vitamin (MULTIVITAMIN) tablet, Take 1 tablet by mouth daily., Disp: , Rfl:  .  Probiotic Product (ALIGN PO), Take 1 capsule by mouth daily., Disp: , Rfl:  .  testosterone cypionate (DEPOTESTOSTERONE CYPIONATE) 200 MG/ML injection, Inject 200 mg into the muscle once a week., Disp: , Rfl:  .  Elastic Bandages & Supports (MEDICAL COMPRESSION THIGH HIGH) MISC, 20-52mHG (Patient not taking: Reported on 06/13/2019), Disp: 1 each, Rfl: 0 .  glimepiride (AMARYL) 2 MG tablet, Take 1 tablet (2 mg total) by mouth daily before breakfast. (Patient not taking: Reported on 06/13/2019), Disp: 90 tablet, Rfl: 3 .  metFORMIN (GLUCOPHAGE-XR) 500 MG 24 hr tablet, Take 4 tablets (2,000 mg total) by mouth daily. (Patient not taking: Reported on 06/13/2019), Disp: 360 tablet, Rfl: 3 .  saxagliptin HCl (ONGLYZA) 5 MG TABS  tablet, Take 1 tablet (5 mg total) by mouth daily. (Patient not taking: Reported on 06/13/2019), Disp: 90 tablet, Rfl: 3 .  valACYclovir (VALTREX) 1000 MG tablet, Take 1 tablet (1,000 mg total) by mouth 3 (three) times daily for 7 days., Disp: 21 tablet, Rfl: 0  EXAM:  VITALS per patient if applicable: Ht '6\' 3"'$  (1.905 m)   Wt 235 lb (106.6 kg)   BMI 29.37 kg/m   GENERAL: alert, oriented, appears well and in no acute distress  HEENT: atraumatic, conjunttiva clear, no obvious abnormalities on inspection of external nose and ears  NECK: normal movements of the head and neck  LUNGS: on inspection no signs of respiratory distress, breathing rate appears normal, no obvious gross SOB, gasping or wheezing  CV: no obvious cyanosis  MS: moves all visible extremities without noticeable abnormality  SKIN:  Linear patches with vesicular lesions in dermatomal distribution along L lateral and anterior chest wall.   PSYCH/NEURO: pleasant and cooperative, no obvious depression or anxiety, speech and thought processing grossly intact  ASSESSMENT AND PLAN:  Discussed the following assessment and plan:  Shingles Rash is consistent with shingles. Managing pain fairly well at this time He does continue to have new lesions appear, will start valtrex 1g TID.  Discussed that pain may persist after rash has resolved.  We discussed that we can talk about management options if that occurs.  Instructed to keep area clean and dry, avoid scrubbing.  Recommend he get shingles vaccine once this has fully resolved.    Exposure to COVID-19 virus No symptoms at this time.  He has testing scheduled for 2/26    30 minutes spent including pre visit preparation, review of prior notes and labs, encounter with patient via video visit and same day documentation.  I discussed the assessment and treatment plan with the patient. The patient was provided an opportunity to ask questions and all were answered. The  patient agreed with the plan and demonstrated an understanding of the instructions.   The patient was advised to call back or seek an in-person evaluation if the symptoms worsen or if the condition fails to improve as anticipated.    Luetta Nutting, DO

## 2019-06-13 NOTE — Assessment & Plan Note (Signed)
Rash is consistent with shingles. Managing pain fairly well at this time He does continue to have new lesions appear, will start valtrex 1g TID.  Discussed that pain may persist after rash has resolved.  We discussed that we can talk about management options if that occurs.  Instructed to keep area clean and dry, avoid scrubbing.  Recommend he get shingles vaccine once this has fully resolved.

## 2019-06-13 NOTE — Progress Notes (Signed)
Called at 04:16 pm to get the pre-visit and it went to VM. Left message to call back.   Patient reports that he has a rash on his chest. No changes in soaps or deodorants.   Started last Thursday or Friday.  Bumps  Ribs on side are the worse right side Feels like chest is hurting.  Wife reports that burn blister.   Tried tea tree and lavender with no relief.  No other treatments.

## 2019-06-13 NOTE — Assessment & Plan Note (Signed)
No symptoms at this time.  He has testing scheduled for 2/26

## 2019-06-14 NOTE — Telephone Encounter (Signed)
1.  Please schedule f/u appt 2.  Then please refill x 1, pending that appt.  

## 2019-06-14 NOTE — Telephone Encounter (Signed)
Please advise 

## 2019-06-15 ENCOUNTER — Ambulatory Visit: Payer: BC Managed Care – PPO | Admitting: Family Medicine

## 2019-06-15 ENCOUNTER — Other Ambulatory Visit: Payer: Self-pay

## 2019-06-15 ENCOUNTER — Encounter: Payer: Self-pay | Admitting: Family Medicine

## 2019-06-15 DIAGNOSIS — B029 Zoster without complications: Secondary | ICD-10-CM

## 2019-06-15 MED ORDER — GABAPENTIN 100 MG PO CAPS: 300.0000 mg | ORAL_CAPSULE | Freq: Three times a day (TID) | ORAL | 0 refills | Status: DC

## 2019-06-15 MED ORDER — TRAMADOL HCL 50 MG PO TABS: 50.0000 mg | ORAL_TABLET | Freq: Three times a day (TID) | ORAL | 0 refills | Status: DC | PRN

## 2019-06-15 NOTE — Assessment & Plan Note (Signed)
Continue valacyclovir  Will add gabapentin 100mg  tid to help with acute pain.  Rx for tramadol as well if needed for severe pain.  Discussed that pain may linger after rash has resolved . He will call if symptoms change or worsen.

## 2019-06-15 NOTE — Patient Instructions (Signed)
Continue valacyclovir  Start gabapentin three times per day for pain  For severe pain you can take the tramadol   Shingles  Shingles is an infection. It gives you a painful skin rash and blisters that have fluid in them. Shingles is caused by the same germ (virus) that causes chickenpox. Shingles only happens in people who:  Have had chickenpox.  Have been given a shot of medicine (vaccine) to protect against chickenpox. Shingles is rare in this group. The first symptoms of shingles may be itching, tingling, or pain in an area on your skin. A rash will show on your skin a few days or weeks later. The rash is likely to be on one side of your body. The rash usually has a shape like a belt or a band. Over time, the rash turns into fluid-filled blisters. The blisters will break open, change into scabs, and dry up. Medicines may:  Help with pain and itching.  Help you get better sooner.  Help to prevent long-term problems. Follow these instructions at home: Medicines  Take over-the-counter and prescription medicines only as told by your doctor.  Put on an anti-itch cream or numbing cream where you have a rash, blisters, or scabs. Do this as told by your doctor. Helping with itching and discomfort   Put cold, wet cloths (cold compresses) on the area of the rash or blisters as told by your doctor.  Cool baths can help you feel better. Try adding baking soda or dry oatmeal to the water to lessen itching. Do not bathe in hot water. Blister and rash care  Keep your rash covered with a loose bandage (dressing).  Wear loose clothing that does not rub on your rash.  Keep your rash and blisters clean. To do this, wash the area with mild soap and cool water as told by your doctor.  Check your rash every day for signs of infection. Check for: ? More redness, swelling, or pain. ? Fluid or blood. ? Warmth. ? Pus or a bad smell.  Do not scratch your rash. Do not pick at your blisters. To  help you to not scratch: ? Keep your fingernails clean and cut short. ? Wear gloves or mittens when you sleep, if scratching is a problem. General instructions  Rest as told by your doctor.  Keep all follow-up visits as told by your doctor. This is important.  Wash your hands often with soap and water. If soap and water are not available, use hand sanitizer. Doing this lowers your chance of getting a skin infection caused by germs (bacteria).  Your infection can cause chickenpox in people who have never had chickenpox or never got a shot of chickenpox vaccine. If you have blisters that did not change into scabs yet, try not to touch other people or be around other people, especially: ? Babies. ? Pregnant women. ? Children who have areas of red, itchy, or rough skin (eczema). ? Very old people who have transplants. ? People who have a long-term (chronic) sickness, like cancer or AIDS. Contact a doctor if:  Your pain does not get better with medicine.  Your pain does not get better after the rash heals.  You have any signs of infection in the rash area. These signs include: ? More redness, swelling, or pain around the rash. ? Fluid or blood coming from the rash. ? The rash area feeling warm to the touch. ? Pus or a bad smell coming from the rash. Get help  right away if:  The rash is on your face or nose.  You have pain in your face or pain by your eye.  You lose feeling on one side of your face.  You have trouble seeing.  You have ear pain, or you have ringing in your ear.  You have a loss of taste.  Your condition gets worse. Summary  Shingles gives you a painful skin rash and blisters that have fluid in them.  Shingles is an infection. It is caused by the same germ (virus) that causes chickenpox.  Keep your rash covered with a loose bandage (dressing). Wear loose clothing that does not rub on your rash.  If you have blisters that did not change into scabs yet, try  not to touch other people or be around people. This information is not intended to replace advice given to you by your health care provider. Make sure you discuss any questions you have with your health care provider. Document Revised: 07/28/2018 Document Reviewed: 12/08/2016 Elsevier Patient Education  2020 [employer].

## 2019-06-15 NOTE — Progress Notes (Signed)
[name] - 59 y.o. male MRN [id number]  Date of birth: 07-31-1960  Subjective Chief Complaint  Patient presents with  . Herpes Zoster    HPI [name] is a 59 y.o. male here today for follow up of shingles.  He was seen via virtual visit on 2/24 for this.  Started on valtrex at that time.  He just picked this up and started yesterday evening.  He has had some new lesions start to appear on the abdomen.  He reports that pain has worsened some.  He is has not had good relief with OTC medications.  He denies fever or chills.   ROS:  A comprehensive ROS was completed and negative except as noted per HPI   Allergies  Allergen Reactions  . Latex Rash    Past Medical History:  Diagnosis Date  . Allergy   . Cervical radiculopathy 08/09/2017  . Diabetes mellitus without complication ([facility])   . Stroke (cerebrum) ([facility]) 08/03/2017  . Type 2 diabetes mellitus with hyperlipidemia [facility])     Past Surgical History:  Procedure Laterality Date  . ESOPHAGOGASTRODUODENOSCOPY (EGD) WITH PROPOFOL N/A 05/09/2015   Procedure: ESOPHAGOGASTRODUODENOSCOPY (EGD) WITH PROPOFOL;  Surgeon: Iva Boop, MD;  [facility];  Service: Endoscopy;  Laterality: N/A;  with esophageal dilation  . LASER ABLATION Left 02/03/15  . VARICOSE VEIN SURGERY      Social History   Socioeconomic History  . Marital status: Married    Spouse name: Not on file  . Number of children: Not on file  . Years of education: Not on file  . Highest education level: Not on file  Occupational History  . Not on file  Tobacco Use  . Smoking status: Never Smoker  . Smokeless tobacco: Never Used  Substance and Sexual Activity  . Alcohol use: No    Alcohol/week: 0.0 standard drinks  . Drug use: No  . Sexual activity: Never  Other Topics Concern  . Not on file  Social History Narrative  . Not on file   Social Determinants of Health   Financial Resource Strain:   . Difficulty of Paying Living Expenses:  Not on file  Food Insecurity:   . Worried About Programme researcher, broadcasting/film/video in the Last Year: Not on file  . Ran Out of Food in the Last Year: Not on file  Transportation Needs:   . Lack of Transportation (Medical): Not on file  . Lack of Transportation (Non-Medical): Not on file  Physical Activity:   . Days of Exercise per Week: Not on file  . Minutes of Exercise per Session: Not on file  Stress:   . Feeling of Stress : Not on file  Social Connections:   . Frequency of Communication with Friends and Family: Not on file  . Frequency of Social Gatherings with Friends and Family: Not on file  . Attends Religious Services: Not on file  . Active Member of Clubs or Organizations: Not on file  . Attends Banker Meetings: Not on file  . Marital Status: Not on file    Family History  Problem Relation Age of Onset  . Parkinson's disease Father   . Heart attack Maternal Grandmother   . Diabetes Neg Hx     Health Maintenance  Topic Date Due  . Hepatitis C Screening  1960/06/27  . COLONOSCOPY  01/13/2009  . OPHTHALMOLOGY EXAM  03/21/2019  . HEMOGLOBIN A1C  03/31/2019  . URINE MICROALBUMIN  04/07/2019  .  FOOT EXAM  05/05/2019  . TETANUS/TDAP  06/12/2020 (Originally 01/13/1978)  . PNEUMOCOCCAL POLYSACCHARIDE VACCINE AGE 64-64 HIGH RISK  Completed  . HIV Screening  Completed  . INFLUENZA VACCINE  Discontinued     ----------------------------------------------------------------------------------------------------------------------------------------------------------------------------------------------------------------- Physical Exam BP 140/88   Pulse 98   Ht 6\' 3"  (1.905 m)   Wt 226 lb (102.5 kg)   SpO2 97%   BMI 28.25 kg/m   Physical Exam Constitutional:      Appearance: Normal appearance.  HENT:     Head: Normocephalic and atraumatic.  Skin:    Comments: Linear rash with multiple vesicles in dermatomal distribution along L chest wall/flank.   Neurological:      General: No focal deficit present.     Mental Status: He is alert.  Psychiatric:        Mood and Affect: Mood normal.        Behavior: Behavior normal.      Media Information   Document Information    ------------------------------------------------------------------------------------------------------------------------------------------------------------------------------------------------------------------- Assessment and Plan  Shingles Continue valacyclovir  Will add gabapentin 100mg  tid to help with acute pain.  Rx for tramadol as well if needed for severe pain.  Discussed that pain may linger after rash has resolved . He will call if symptoms change or worsen.     No orders of the defined types were placed in this encounter.   No follow-ups on file.    This visit occurred during the SARS-CoV-2 public health emergency.  Safety protocols were in place, including screening questions prior to the visit, additional usage of staff PPE, and extensive cleaning of exam room while observing appropriate contact time as indicated for disinfecting solutions.

## 2019-06-19 ENCOUNTER — Encounter: Payer: Self-pay | Admitting: Family Medicine

## 2019-06-19 ENCOUNTER — Ambulatory Visit: Payer: BC Managed Care – PPO | Admitting: Family Medicine

## 2019-06-19 DIAGNOSIS — B029 Zoster without complications: Secondary | ICD-10-CM

## 2019-06-19 MED ORDER — HYDROCODONE-ACETAMINOPHEN 5-325 MG PO TABS: 1.0000 | ORAL_TABLET | Freq: Four times a day (QID) | ORAL | 0 refills | Status: DC | PRN

## 2019-06-19 MED ORDER — GABAPENTIN 300 MG PO CAPS: 300.0000 mg | ORAL_CAPSULE | Freq: Three times a day (TID) | ORAL | 1 refills | Status: DC

## 2019-06-19 NOTE — Progress Notes (Signed)
[name] - 59 y.o. male MRN [id number]  Date of birth: February 26, 1961  Subjective Chief Complaint  Patient presents with  . Herpes Zoster    HPI [name] is a 59 y.o. male here today for follow up of shingles.  He reports that new lesions have lessened and areas are starting to scab over however he continues to have quite a bit of pain.  He has only taken gabapentin intermittently.  He reports that tramadol does not improve his pain at all.  He was advised to try capsaicin containing cream by his local pharmacist.  He denies fever, chills.   ROS:  A comprehensive ROS was completed and negative except as noted per HPI  Allergies  Allergen Reactions  . Latex Rash    Past Medical History:  Diagnosis Date  . Allergy   . Cervical radiculopathy 08/09/2017  . Diabetes mellitus without complication ([place])   . Stroke (cerebrum) ([place]) 08/03/2017  . Type 2 diabetes mellitus with hyperlipidemia [facility])     Past Surgical History:  Procedure Laterality Date  . ESOPHAGOGASTRODUODENOSCOPY (EGD) WITH PROPOFOL N/A 05/09/2015   Procedure: ESOPHAGOGASTRODUODENOSCOPY (EGD) WITH PROPOFOL;  Surgeon: Gatha Mayer, MD;  [facility];  Service: Endoscopy;  Laterality: N/A;  with esophageal dilation  . LASER ABLATION Left 02/03/15  . VARICOSE VEIN SURGERY      Social History   Socioeconomic History  . Marital status: Married    Spouse name: Not on file  . Number of children: Not on file  . Years of education: Not on file  . Highest education level: Not on file  Occupational History  . Not on file  Tobacco Use  . Smoking status: Never Smoker  . Smokeless tobacco: Never Used  Substance and Sexual Activity  . Alcohol use: No    Alcohol/week: 0.0 standard drinks  . Drug use: No  . Sexual activity: Never  Other Topics Concern  . Not on file  Social History Narrative  . Not on file   Social Determinants of Health   Financial Resource Strain:   . Difficulty of Paying  Living Expenses: Not on file  Food Insecurity:   . Worried About Charity fundraiser in the Last Year: Not on file  . Ran Out of Food in the Last Year: Not on file  Transportation Needs:   . Lack of Transportation (Medical): Not on file  . Lack of Transportation (Non-Medical): Not on file  Physical Activity:   . Days of Exercise per Week: Not on file  . Minutes of Exercise per Session: Not on file  Stress:   . Feeling of Stress : Not on file  Social Connections:   . Frequency of Communication with Friends and Family: Not on file  . Frequency of Social Gatherings with Friends and Family: Not on file  . Attends Religious Services: Not on file  . Active Member of Clubs or Organizations: Not on file  . Attends Archivist Meetings: Not on file  . Marital Status: Not on file    Family History  Problem Relation Age of Onset  . Parkinson's disease Father   . Heart attack Maternal Grandmother   . Diabetes Neg Hx     Health Maintenance  Topic Date Due  . Hepatitis C Screening  1960-09-08  . COLONOSCOPY  01/13/2009  . OPHTHALMOLOGY EXAM  03/21/2019  . HEMOGLOBIN A1C  03/31/2019  . URINE MICROALBUMIN  04/07/2019  . FOOT EXAM  05/05/2019  .  TETANUS/TDAP  06/12/2020 (Originally 01/13/1978)  . PNEUMOCOCCAL POLYSACCHARIDE VACCINE AGE 52-64 HIGH RISK  Completed  . HIV Screening  Completed  . INFLUENZA VACCINE  Discontinued     ----------------------------------------------------------------------------------------------------------------------------------------------------------------------------------------------------------------- Physical Exam BP 117/71   Pulse 80   Temp 97.8 F (36.6 C) (Oral)   Ht 6\' 3"  (1.905 m)   Wt 228 lb 1.9 oz (103.5 kg)   SpO2 96%   BMI 28.51 kg/m   Physical Exam Constitutional:      Appearance: Normal appearance.  HENT:     Head: Normocephalic and atraumatic.  Skin:    Comments: Previous vesicular areas are starting to scab over.      Neurological:     General: No focal deficit present.     Mental Status: He is alert and oriented to person, place, and time.     Media Information   Document Information  Photos    06/19/2019 11:43   ------------------------------------------------------------------------------------------------------------------------------------------------------------------------------------------------------------------- Assessment and Plan  Shingles Complete valtrex Discussed taking gabapentin 300mg  TID Will change tramadol to hydrocodone.  He may use capsaicin containing cream on areas that have scabbed over.  If pain continues to remain uncontrolled we can add lidoderm patch.     Meds ordered this encounter  Medications  . gabapentin (NEURONTIN) 300 MG capsule    Sig: Take 1 capsule (300 mg total) by mouth 3 (three) times daily.    Dispense:  90 capsule    Refill:  1  . HYDROcodone-acetaminophen (NORCO) 5-325 MG tablet    Sig: Take 1 tablet by mouth every 6 (six) hours as needed for moderate pain or severe pain.    Dispense:  20 tablet    Refill:  0    No follow-ups on file.    This visit occurred during the SARS-CoV-2 public health emergency.  Safety protocols were in place, including screening questions prior to the visit, additional usage of staff PPE, and extensive cleaning of exam room while observing appropriate contact time as indicated for disinfecting solutions.

## 2019-06-19 NOTE — Assessment & Plan Note (Signed)
Complete valtrex Discussed taking gabapentin 300mg  TID Will change tramadol to hydrocodone.  He may use capsaicin containing cream on areas that have scabbed over.  If pain continues to remain uncontrolled we can add lidoderm patch.

## 2019-06-19 NOTE — Patient Instructions (Addendum)
Zostrix cream (capsaicin) can be helpful for shingles pain.  Take gabapentin 300mg  three times per day.  You may use hydrocodone as needed for pain control We can add lidocaine patch if pain continues to remain uncontrolled.   You may return back to work on Thursday if you would like.

## 2019-07-03 DIAGNOSIS — R948 Abnormal results of function studies of other organs and systems: Secondary | ICD-10-CM | POA: Diagnosis not present

## 2019-07-03 DIAGNOSIS — E291 Testicular hypofunction: Secondary | ICD-10-CM | POA: Diagnosis not present

## 2019-07-06 ENCOUNTER — Ambulatory Visit: Payer: BC Managed Care – PPO | Admitting: Family Medicine

## 2019-07-06 ENCOUNTER — Encounter: Payer: Self-pay | Admitting: Family Medicine

## 2019-07-06 DIAGNOSIS — B029 Zoster without complications: Secondary | ICD-10-CM

## 2019-07-06 MED ORDER — LIDOCAINE 5 % EX PTCH: 1.0000 | MEDICATED_PATCH | CUTANEOUS | 0 refills | Status: DC

## 2019-07-06 NOTE — Progress Notes (Signed)
[name] - 59 y.o. male MRN [id number]  Date of birth: 1960-09-20  Subjective No chief complaint on file.   HPI [name] is a 59 y.o. male with history of of T2DM here today for follow up of shingles.  He reports that blisters have scabbed over but he noticed some pain along the abdominal wall and and wanted to be sure he wasn't having new outbreak.  He has had improvement with gabapentin but still has some burning pain.   ROS:  A comprehensive ROS was completed and negative except as noted per HPI   Allergies  Allergen Reactions  . Latex Rash    Past Medical History:  Diagnosis Date  . Allergy   . Cervical radiculopathy 08/09/2017  . Diabetes mellitus without complication (HCC)   . Stroke (cerebrum) (HCC) 08/03/2017  . Type 2 diabetes mellitus with hyperlipidemia [facility])     Past Surgical History:  Procedure Laterality Date  . ESOPHAGOGASTRODUODENOSCOPY (EGD) WITH PROPOFOL N/A 05/09/2015   Procedure: ESOPHAGOGASTRODUODENOSCOPY (EGD) WITH PROPOFOL;  Surgeon: Iva Boop, MD;  Location: [facility];  Service: Endoscopy;  Laterality: N/A;  with esophageal dilation  . LASER ABLATION Left 02/03/15  . VARICOSE VEIN SURGERY      Social History   Socioeconomic History  . Marital status: Married    Spouse name: Not on file  . Number of children: Not on file  . Years of education: Not on file  . Highest education level: Not on file  Occupational History  . Not on file  Tobacco Use  . Smoking status: Never Smoker  . Smokeless tobacco: Never Used  Substance and Sexual Activity  . Alcohol use: No    Alcohol/week: 0.0 standard drinks  . Drug use: No  . Sexual activity: Never  Other Topics Concern  . Not on file  Social History Narrative  . Not on file   Social Determinants of Health   Financial Resource Strain:   . Difficulty of Paying Living Expenses:   Food Insecurity:   . Worried About Programme researcher, broadcasting/film/video in the Last Year:   . Barista in the  Last Year:   Transportation Needs:   . Freight forwarder (Medical):   Marland Kitchen Lack of Transportation (Non-Medical):   Physical Activity:   . Days of Exercise per Week:   . Minutes of Exercise per Session:   Stress:   . Feeling of Stress :   Social Connections:   . Frequency of Communication with Friends and Family:   . Frequency of Social Gatherings with Friends and Family:   . Attends Religious Services:   . Active Member of Clubs or Organizations:   . Attends Banker Meetings:   Marland Kitchen Marital Status:     Family History  Problem Relation Age of Onset  . Parkinson's disease Father   . Heart attack Maternal Grandmother   . Diabetes Neg Hx     Health Maintenance  Topic Date Due  . Hepatitis C Screening  Never done  . COLONOSCOPY  Never done  . OPHTHALMOLOGY EXAM  03/21/2019  . URINE MICROALBUMIN  04/07/2019  . HEMOGLOBIN A1C  10/19/2019 (Originally 03/31/2019)  . TETANUS/TDAP  06/12/2020 (Originally 01/13/1978)  . FOOT EXAM  06/18/2020 (Originally 05/05/2019)  . PNEUMOCOCCAL POLYSACCHARIDE VACCINE AGE 8-64 HIGH RISK  Completed  . HIV Screening  Completed  . INFLUENZA VACCINE  Discontinued     ----------------------------------------------------------------------------------------------------------------------------------------------------------------------------------------------------------------- Physical Exam BP 118/75   Pulse 96  Temp 98 F (36.7 C) (Oral)   Ht 6\' 3"  (1.905 m)   Wt 228 lb (103.4 kg)   BMI 28.50 kg/m   Physical Exam Constitutional:      Appearance: Normal appearance.  HENT:     Head: Normocephalic and atraumatic.  Eyes:     General: No scleral icterus. Musculoskeletal:     Cervical back: Neck supple.  Skin:    Comments: Lesions along flank have scabbed over.  No new lesions noted on abdomen.   Neurological:     General: No focal deficit present.     Mental Status: He is alert.  Psychiatric:        Mood and Affect: Mood normal.         Behavior: Behavior normal.     ------------------------------------------------------------------------------------------------------------------------------------------------------------------------------------------------------------------- Assessment and Plan  Shingles Healing well. No new lesions noted. Continue gabapentin for pain control.  Will also add lidoderm patch as needed for pain control.     Meds ordered this encounter  Medications  . lidocaine (LIDODERM) 5 %    Sig: Place 1 patch onto the skin daily. Remove & Discard patch within 12 hours or as directed by MD    Dispense:  30 patch    Refill:  0    No follow-ups on file.    This visit occurred during the SARS-CoV-2 public health emergency.  Safety protocols were in place, including screening questions prior to the visit, additional usage of staff PPE, and extensive cleaning of exam room while observing appropriate contact time as indicated for disinfecting solutions.

## 2019-07-06 NOTE — Assessment & Plan Note (Signed)
Healing well. No new lesions noted. Continue gabapentin for pain control.  Will also add lidoderm patch as needed for pain control.

## 2019-07-06 NOTE — Patient Instructions (Signed)
Continue gabapentin three times per day.  Try lidocaine patch to area as needed.  Let me know if you notice any new blisters.     Shingles  Shingles is an infection. It gives you a painful skin rash and blisters that have fluid in them. Shingles is caused by the same germ (virus) that causes chickenpox. Shingles only happens in people who:  Have had chickenpox.  Have been given a shot of medicine (vaccine) to protect against chickenpox. Shingles is rare in this group. The first symptoms of shingles may be itching, tingling, or pain in an area on your skin. A rash will show on your skin a few days or weeks later. The rash is likely to be on one side of your body. The rash usually has a shape like a belt or a band. Over time, the rash turns into fluid-filled blisters. The blisters will break open, change into scabs, and dry up. Medicines may:  Help with pain and itching.  Help you get better sooner.  Help to prevent long-term problems. Follow these instructions at home: Medicines  Take over-the-counter and prescription medicines only as told by your doctor.  Put on an anti-itch cream or numbing cream where you have a rash, blisters, or scabs. Do this as told by your doctor. Helping with itching and discomfort   Put cold, wet cloths (cold compresses) on the area of the rash or blisters as told by your doctor.  Cool baths can help you feel better. Try adding baking soda or dry oatmeal to the water to lessen itching. Do not bathe in hot water. Blister and rash care  Keep your rash covered with a loose bandage (dressing).  Wear loose clothing that does not rub on your rash.  Keep your rash and blisters clean. To do this, wash the area with mild soap and cool water as told by your doctor.  Check your rash every day for signs of infection. Check for: ? More redness, swelling, or pain. ? Fluid or blood. ? Warmth. ? Pus or a bad smell.  Do not scratch your rash. Do not pick at  your blisters. To help you to not scratch: ? Keep your fingernails clean and cut short. ? Wear gloves or mittens when you sleep, if scratching is a problem. General instructions  Rest as told by your doctor.  Keep all follow-up visits as told by your doctor. This is important.  Wash your hands often with soap and water. If soap and water are not available, use hand sanitizer. Doing this lowers your chance of getting a skin infection caused by germs (bacteria).  Your infection can cause chickenpox in people who have never had chickenpox or never got a shot of chickenpox vaccine. If you have blisters that did not change into scabs yet, try not to touch other people or be around other people, especially: ? Babies. ? Pregnant women. ? Children who have areas of red, itchy, or rough skin (eczema). ? Very old people who have transplants. ? People who have a long-term (chronic) sickness, like cancer or AIDS. Contact a doctor if:  Your pain does not get better with medicine.  Your pain does not get better after the rash heals.  You have any signs of infection in the rash area. These signs include: ? More redness, swelling, or pain around the rash. ? Fluid or blood coming from the rash. ? The rash area feeling warm to the touch. ? Pus or a bad smell  coming from the rash. Get help right away if:  The rash is on your face or nose.  You have pain in your face or pain by your eye.  You lose feeling on one side of your face.  You have trouble seeing.  You have ear pain, or you have ringing in your ear.  You have a loss of taste.  Your condition gets worse. Summary  Shingles gives you a painful skin rash and blisters that have fluid in them.  Shingles is an infection. It is caused by the same germ (virus) that causes chickenpox.  Keep your rash covered with a loose bandage (dressing). Wear loose clothing that does not rub on your rash.  If you have blisters that did not change  into scabs yet, try not to touch other people or be around people. This information is not intended to replace advice given to you by your health care provider. Make sure you discuss any questions you have with your health care provider. Document Revised: 07/28/2018 Document Reviewed: 12/08/2016 Elsevier Patient Education  2020 [employer].

## 2019-07-09 ENCOUNTER — Telehealth: Payer: Self-pay

## 2019-07-09 NOTE — Telephone Encounter (Signed)
Please advise 

## 2019-07-09 NOTE — Telephone Encounter (Signed)
Spoke with patient.  He had question regarding OTC medication with current medications.

## 2019-07-09 NOTE — Telephone Encounter (Signed)
Pt left a vm msg Friday afternoon stating he forgot to mention something to Dr. Ashley Royalty about a private matter. Requesting a call back. Thanks.

## 2019-07-12 ENCOUNTER — Telehealth: Payer: Self-pay | Admitting: Family Medicine

## 2019-07-12 NOTE — Telephone Encounter (Signed)
Received fax for PA on Lidocaine patch sent through cover my meds and received approval.  Waiting on fax from [facility] with authorization. - CF

## 2019-07-31 ENCOUNTER — Other Ambulatory Visit: Payer: Self-pay | Admitting: Endocrinology

## 2019-08-01 ENCOUNTER — Telehealth: Payer: Self-pay

## 2019-08-01 NOTE — Telephone Encounter (Signed)
Please review and advise if a warning letter is needed

## 2019-08-01 NOTE — Telephone Encounter (Signed)
Refills for DM medications will need to come from PCP per Dr. George Hugh instructions below.

## 2019-08-01 NOTE — Telephone Encounter (Signed)
Patient called and states that he got a message that he needs to make an appointment for refills.  Patient states that he got sick the last time he came to the Dr.  He states that he does not feel like he should have to come in and get sick again unless we want to pay his dr bills.  I told him that I would send note back, that if he did not want to schedule an appointment that was on him.  I could not force him to make an appointment.  He said ok and hung up the phone.

## 2019-08-01 NOTE — Telephone Encounter (Signed)
It is fine for pt to f/u DM with primary care provider.

## 2019-08-22 DIAGNOSIS — H35033 Hypertensive retinopathy, bilateral: Secondary | ICD-10-CM | POA: Diagnosis not present

## 2019-08-22 DIAGNOSIS — E119 Type 2 diabetes mellitus without complications: Secondary | ICD-10-CM | POA: Diagnosis not present

## 2019-08-22 DIAGNOSIS — H2513 Age-related nuclear cataract, bilateral: Secondary | ICD-10-CM | POA: Diagnosis not present

## 2019-08-22 LAB — HM DIABETES EYE EXAM

## 2019-09-13 NOTE — Telephone Encounter (Signed)
Never received [employer] fax medication is approved - CF

## 2019-10-03 DIAGNOSIS — E291 Testicular hypofunction: Secondary | ICD-10-CM | POA: Diagnosis not present

## 2019-10-24 ENCOUNTER — Other Ambulatory Visit: Payer: Self-pay | Admitting: Endocrinology

## 2019-10-31 ENCOUNTER — Other Ambulatory Visit: Payer: Self-pay | Admitting: Endocrinology

## 2020-01-14 ENCOUNTER — Ambulatory Visit: Payer: BC Managed Care – PPO | Admitting: Family Medicine

## 2020-01-14 ENCOUNTER — Other Ambulatory Visit: Payer: Self-pay

## 2020-01-14 ENCOUNTER — Encounter: Payer: Self-pay | Admitting: Family Medicine

## 2020-01-14 VITALS — BP 136/85 | HR 93 | Ht 75.2 in | Wt 230.1 lb

## 2020-01-14 DIAGNOSIS — E1169 Type 2 diabetes mellitus with other specified complication: Secondary | ICD-10-CM | POA: Diagnosis not present

## 2020-01-14 DIAGNOSIS — B0229 Other postherpetic nervous system involvement: Secondary | ICD-10-CM | POA: Diagnosis not present

## 2020-01-14 DIAGNOSIS — E785 Hyperlipidemia, unspecified: Secondary | ICD-10-CM | POA: Diagnosis not present

## 2020-01-14 MED ORDER — DAPAGLIFLOZIN PROPANEDIOL 5 MG PO TABS: 5.0000 mg | ORAL_TABLET | Freq: Every day | ORAL | 3 refills | Status: DC

## 2020-01-14 MED ORDER — FREESTYLE LIBRE 14 DAY READER DEVI: 0 refills | Status: DC

## 2020-01-14 MED ORDER — GLIMEPIRIDE 2 MG PO TABS: 2.0000 mg | ORAL_TABLET | Freq: Every day | ORAL | 3 refills | Status: DC

## 2020-01-14 MED ORDER — FREESTYLE LIBRE 14 DAY SENSOR MISC: 1 refills | Status: DC

## 2020-01-14 MED ORDER — METFORMIN HCL ER 500 MG PO TB24: 2000.0000 mg | ORAL_TABLET | Freq: Every day | ORAL | 3 refills | Status: DC

## 2020-01-14 MED ORDER — GABAPENTIN 300 MG PO CAPS: 300.0000 mg | ORAL_CAPSULE | Freq: Three times a day (TID) | ORAL | 1 refills | Status: DC

## 2020-01-14 MED ORDER — SAXAGLIPTIN HCL 5 MG PO TABS
5.0000 mg | ORAL_TABLET | Freq: Every day | ORAL | 3 refills | Status: DC
Start: 2020-01-14 — End: 2020-01-24

## 2020-01-14 NOTE — Patient Instructions (Addendum)
Continue current diabetes medications.  Have labs completed today.  Follow a low salt diet to keep blood pressure controlled.  See me again in 3 months.

## 2020-01-14 NOTE — Progress Notes (Signed)
[name] - 59 y.o. male MRN [id number]  Date of birth: 1960-06-02  Subjective Chief Complaint  Patient presents with  . Diabetes    HPI [name] is a 59 y.o. male here today for follow up visit.  He has history of HTN, T2DM, and low testosterone here today for follow up.   He reports that he continues to have mild pain from previous shingles.  He is out of gabapentin.  He did find this to be helpful previously.    He has been seeing endocrinology for management of his diabetes.  Per last phone note from endocrinology he was told that PCP can manage his medications which he would like to proceed with.  His blood sugars have not been well controlled.  Current treatment with onglyza, metformin, farxiga, and glimepiride.  He has not been compliant with medications and diet.  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     BP has been well controlled.  He is not taking medication fo management of HTN at this time.    ROS:  A comprehensive ROS was completed and negative except as noted per HPI   Allergies  Allergen Reactions  . Latex Rash    Past Medical History:  Diagnosis Date  . Allergy   . Cervical radiculopathy 08/09/2017  . Diabetes mellitus without complication (HCC)   . Stroke (cerebrum) (HCC) 08/03/2017  . Type 2 diabetes mellitus with hyperlipidemia [facility])     Past Surgical History:  Procedure Laterality Date  . ESOPHAGOGASTRODUODENOSCOPY (EGD) WITH PROPOFOL N/A 05/09/2015   Procedure: ESOPHAGOGASTRODUODENOSCOPY (EGD) WITH PROPOFOL;  Surgeon: Iva Boop, MD;  Location: [facility];  Service: Endoscopy;  Laterality: N/A;  with esophageal dilation  . LASER ABLATION Left 02/03/15  . VARICOSE VEIN SURGERY      Social History   Socioeconomic History  . Marital status: Married    Spouse name: Not on file  . Number of children: Not on file  . Years of education: Not on file  . Highest education level: Not on file  Occupational History  .  Not on file  Tobacco Use  . Smoking status: Never Smoker  . Smokeless tobacco: Never Used  Vaping Use  . Vaping Use: Never used  Substance and Sexual Activity  . Alcohol use: No    Alcohol/week: 0.0 standard drinks  . Drug use: No  . Sexual activity: Never  Other Topics Concern  . Not on file  Social History Narrative  . Not on file   Social Determinants of Health   Financial Resource Strain:   . Difficulty of Paying Living Expenses: Not on file  Food Insecurity:   . Worried About Programme researcher, broadcasting/film/video in the Last Year: Not on file  . Ran Out of Food in the Last Year: Not on file  Transportation Needs:   . Lack of Transportation (Medical): Not on file  . Lack of Transportation (Non-Medical): Not on file  Physical Activity:   . Days of Exercise per Week: Not on file  . Minutes of Exercise per Session: Not on file  Stress:   . Feeling of Stress : Not on file  Social Connections:   . Frequency of Communication with Friends and Family: Not on file  . Frequency of Social Gatherings with Friends and Family: Not on file  . Attends Religious Services: Not on file  . Active Member of Clubs or Organizations: Not on file  . Attends  Club or Organization Meetings: Not on file  . Marital Status: Not on file    Family History  Problem Relation Age of Onset  . Parkinson's disease Father   . Heart attack Maternal Grandmother   . Diabetes Neg Hx     Health Maintenance  Topic Date Due  . Hepatitis C Screening  Never done  . COLONOSCOPY  Never done  . HEMOGLOBIN A1C  03/31/2019  . URINE MICROALBUMIN  04/07/2019  . TETANUS/TDAP  06/12/2020 (Originally 01/13/1978)  . FOOT EXAM  06/18/2020 (Originally 05/05/2019)  . OPHTHALMOLOGY EXAM  08/21/2020  . PNEUMOCOCCAL POLYSACCHARIDE VACCINE AGE 76-64 HIGH RISK  Completed  . HIV Screening  Completed  . INFLUENZA VACCINE  Discontinued  . COVID-19 Vaccine  Discontinued      ----------------------------------------------------------------------------------------------------------------------------------------------------------------------------------------------------------------- Physical Exam BP 136/85 (BP Location: Left Arm, Patient Position: Sitting, Cuff Size: Large)   Pulse 93   Ht 6' 3.2" (1.91 m)   Wt 230 lb 1.6 oz (104.4 kg)   SpO2 95%   BMI 28.61 kg/m   Physical Exam Constitutional:      Appearance: Normal appearance.  HENT:     Head: Normocephalic and atraumatic.  Eyes:     General: No scleral icterus. Cardiovascular:     Rate and Rhythm: Normal rate and regular rhythm.  Pulmonary:     Effort: Pulmonary effort is normal.     Breath sounds: Normal breath sounds.  Musculoskeletal:     Cervical back: Neck supple.  Neurological:     General: No focal deficit present.     Mental Status: He is alert.  Psychiatric:        Mood and Affect: Mood normal.        Behavior: Behavior normal.     ------------------------------------------------------------------------------------------------------------------------------------------------------------------------------------------------------------------- Assessment and Plan  Type 2 diabetes mellitus with hyperlipidemia (HCC) Diabetes has not been well controlled.  He was seeing endocrinology but would prefer to have primary care manage his medications. He has not been very compliant with medications previously.  Update a1c and recommend that he take medications daily and follow a low carb diet.  Can consider addition of GLP1 if diabetes remains uncontrolled.   Post herpetic neuralgia Gabapentin renewed.    Meds ordered this encounter  Medications  . gabapentin (NEURONTIN) 300 MG capsule    Sig: Take 1 capsule (300 mg total) by mouth 3 (three) times daily. For continued shingles pain    Dispense:  90 capsule    Refill:  1  . glimepiride (AMARYL) 2 MG tablet    Sig: Take 1 tablet (2 mg  total) by mouth daily before breakfast.    Dispense:  90 tablet    Refill:  3  . metFORMIN (GLUCOPHAGE-XR) 500 MG 24 hr tablet    Sig: Take 4 tablets (2,000 mg total) by mouth daily.    Dispense:  360 tablet    Refill:  3  . dapagliflozin propanediol (FARXIGA) 5 MG TABS tablet    Sig: Take 1 tablet (5 mg total) by mouth daily.    Dispense:  90 tablet    Refill:  3  . saxagliptin HCl (ONGLYZA) 5 MG TABS tablet    Sig: Take 1 tablet (5 mg total) by mouth daily.    Dispense:  90 tablet    Refill:  3  . Continuous Blood Gluc Receiver (FREESTYLE LIBRE 14 DAY READER) DEVI    Sig: Use to check blood sugar as needed.    Dispense:  1 each  Refill:  0  . Continuous Blood Gluc Sensor (FREESTYLE LIBRE 14 DAY SENSOR) MISC    Sig: Use to check blood sugar as needed. Replace every 14 days.    Dispense:  6 each    Refill:  1    Return in about 3 months (around 04/14/2020) for HTN/DM.    This visit occurred during the SARS-CoV-2 public health emergency.  Safety protocols were in place, including screening questions prior to the visit, additional usage of staff PPE, and extensive cleaning of exam room while observing appropriate contact time as indicated for disinfecting solutions.

## 2020-01-15 LAB — MICROALBUMIN / CREATININE URINE RATIO
Creatinine, Urine: 82 mg/dL (ref 20–320)
Microalb Creat Ratio: 257 mcg/mg creat — ABNORMAL HIGH (ref <30)
Microalb, Ur: 21.1 mg/dL

## 2020-01-15 LAB — COMPLETE METABOLIC PANEL WITH GFR
AG Ratio: 1.6 (calc) (ref 1.0–2.5)
ALT: 18 U/L (ref 9–46)
AST: 18 U/L (ref 10–35)
Albumin: 4.4 g/dL (ref 3.6–5.1)
Alkaline phosphatase (APISO): 80 U/L (ref 35–144)
BUN: 23 mg/dL (ref 7–25)
CO2: 26 mmol/L (ref 20–32)
Calcium: 9.4 mg/dL (ref 8.6–10.3)
Chloride: 101 mmol/L (ref 98–110)
Creat: 1.12 mg/dL (ref 0.70–1.25)
GFR, Est African American: 82 mL/min/{1.73_m2} (ref >=60)
GFR, Est Non African American: 71 mL/min/{1.73_m2} (ref >=60)
Globulin: 2.7 g/dL (calc) (ref 1.9–3.7)
Glucose, Bld: 155 mg/dL — ABNORMAL HIGH (ref 65–139)
Potassium: 4.1 mmol/L (ref 3.5–5.3)
Sodium: 136 mmol/L (ref 135–146)
Total Bilirubin: 0.9 mg/dL (ref 0.2–1.2)
Total Protein: 7.1 g/dL (ref 6.1–8.1)

## 2020-01-15 LAB — HEMOGLOBIN A1C
Hgb A1c MFr Bld: 9.3 % of total Hgb — ABNORMAL HIGH (ref <5.7)
Mean Plasma Glucose: 220 (calc)
eAG (mmol/L): 12.2 (calc)

## 2020-01-16 DIAGNOSIS — B0229 Other postherpetic nervous system involvement: Secondary | ICD-10-CM | POA: Insufficient documentation

## 2020-01-16 NOTE — Assessment & Plan Note (Signed)
Diabetes has not been well controlled.  He was seeing endocrinology but would prefer to have primary care manage his medications. He has not been very compliant with medications previously.  Update a1c and recommend that he take medications daily and follow a low carb diet.  Can consider addition of GLP1 if diabetes remains uncontrolled.

## 2020-01-16 NOTE — Assessment & Plan Note (Signed)
Gabapentin renewed.

## 2020-01-18 ENCOUNTER — Ambulatory Visit: Payer: BC Managed Care – PPO | Admitting: Endocrinology

## 2020-01-18 LAB — HM DIABETES EYE EXAM

## 2020-01-24 ENCOUNTER — Ambulatory Visit: Payer: BC Managed Care – PPO | Admitting: Family Medicine

## 2020-01-24 ENCOUNTER — Encounter: Payer: Self-pay | Admitting: Family Medicine

## 2020-01-24 DIAGNOSIS — E1169 Type 2 diabetes mellitus with other specified complication: Secondary | ICD-10-CM | POA: Diagnosis not present

## 2020-01-24 DIAGNOSIS — E785 Hyperlipidemia, unspecified: Secondary | ICD-10-CM

## 2020-01-24 MED ORDER — SITAGLIPTIN PHOSPHATE 100 MG PO TABS: 100.0000 mg | ORAL_TABLET | Freq: Every day | ORAL | 2 refills | Status: DC

## 2020-01-24 MED ORDER — DEXCOM G6 TRANSMITTER MISC: 2 refills | Status: DC

## 2020-01-24 MED ORDER — DEXCOM G6 SENSOR MISC: 1 refills | Status: DC

## 2020-01-24 MED ORDER — DEXCOM G6 RECEIVER DEVI: 0 refills | Status: DC

## 2020-01-24 NOTE — Patient Instructions (Signed)

## 2020-01-24 NOTE — Assessment & Plan Note (Signed)
Diabetes has not been well controlled.   Reviewed medications he should be taking.  Onglyza is no longer covered, so will change to Januvia.  Rx for Dexcom to closely monitor glucose at home Dietary recommendations reviewed and provided with handout.

## 2020-01-24 NOTE — Progress Notes (Signed)
[name] - 59 y.o. male MRN [id number]  Date of birth: 25-May-1960  Subjective Chief Complaint  Patient presents with  . Diabetes    HPI [name] is a 59 y.o. male here today for follow up of recent labs.   A1c of 9.3 indicates that diabetes has not been well controlled.  This is similar to previous readings he has had  He does not seem to be clear on what medications he should be taking and has not been compliant with prescribed regimen.  He also remains fairly liberal with his diet. He denies symptoms related to diabetes including increased thirst or urination.   He does not want to transition to insulin as he is working on getting [employer].   ROS:  A comprehensive ROS was completed and negative except as noted per HPI    Allergies  Allergen Reactions  . Latex Rash    Past Medical History:  Diagnosis Date  . Allergy   . Cervical radiculopathy 08/09/2017  . Diabetes mellitus without complication (HCC)   . Stroke (cerebrum) (HCC) 08/03/2017  . Type 2 diabetes mellitus with hyperlipidemia [facility])     Past Surgical History:  Procedure Laterality Date  . ESOPHAGOGASTRODUODENOSCOPY (EGD) WITH PROPOFOL N/A 05/09/2015   Procedure: ESOPHAGOGASTRODUODENOSCOPY (EGD) WITH PROPOFOL;  Surgeon: Iva Boop, MD;  Location: [facility];  Service: Endoscopy;  Laterality: N/A;  with esophageal dilation  . LASER ABLATION Left 02/03/15  . VARICOSE VEIN SURGERY      Social History   Socioeconomic History  . Marital status: Married    Spouse name: Not on file  . Number of children: Not on file  . Years of education: Not on file  . Highest education level: Not on file  Occupational History  . Not on file  Tobacco Use  . Smoking status: Never Smoker  . Smokeless tobacco: Never Used  Vaping Use  . Vaping Use: Never used  Substance and Sexual Activity  . Alcohol use: No    Alcohol/week: 0.0 standard drinks  . Drug use: No  . Sexual activity: Never  Other Topics  Concern  . Not on file  Social History Narrative  . Not on file   Social Determinants of Health   Financial Resource Strain:   . Difficulty of Paying Living Expenses: Not on file  Food Insecurity:   . Worried About Programme researcher, broadcasting/film/video in the Last Year: Not on file  . Ran Out of Food in the Last Year: Not on file  Transportation Needs:   . Lack of Transportation (Medical): Not on file  . Lack of Transportation (Non-Medical): Not on file  Physical Activity:   . Days of Exercise per Week: Not on file  . Minutes of Exercise per Session: Not on file  Stress:   . Feeling of Stress : Not on file  Social Connections:   . Frequency of Communication with Friends and Family: Not on file  . Frequency of Social Gatherings with Friends and Family: Not on file  . Attends Religious Services: Not on file  . Active Member of Clubs or Organizations: Not on file  . Attends Banker Meetings: Not on file  . Marital Status: Not on file    Family History  Problem Relation Age of Onset  . Parkinson's disease Father   . Heart attack Maternal Grandmother   . Diabetes Neg Hx     Health Maintenance  Topic Date Due  . Hepatitis C  Screening  Never done  . COLONOSCOPY  Never done  . TETANUS/TDAP  06/12/2020 (Originally 01/13/1978)  . FOOT EXAM  06/18/2020 (Originally 05/05/2019)  . HEMOGLOBIN A1C  07/13/2020  . OPHTHALMOLOGY EXAM  08/21/2020  . URINE MICROALBUMIN  01/13/2021  . PNEUMOCOCCAL POLYSACCHARIDE VACCINE AGE 9-64 HIGH RISK  Completed  . HIV Screening  Completed  . INFLUENZA VACCINE  Discontinued  . COVID-19 Vaccine  Discontinued     ----------------------------------------------------------------------------------------------------------------------------------------------------------------------------------------------------------------- Physical Exam BP 127/69 (BP Location: Left Arm, Patient Position: Sitting, Cuff Size: Large)   Pulse 92   Wt 233 lb 6.4 oz (105.9 kg)    SpO2 92%   BMI 29.02 kg/m   Physical Exam Constitutional:      Appearance: Normal appearance.  HENT:     Head: Normocephalic and atraumatic.  Eyes:     General: No scleral icterus. Cardiovascular:     Rate and Rhythm: Normal rate and regular rhythm.  Pulmonary:     Effort: Pulmonary effort is normal.     Breath sounds: Normal breath sounds.  Musculoskeletal:     Cervical back: Neck supple.  Neurological:     General: No focal deficit present.     Mental Status: He is alert.  Psychiatric:        Mood and Affect: Mood normal.     ------------------------------------------------------------------------------------------------------------------------------------------------------------------------------------------------------------------- Assessment and Plan  Type 2 diabetes mellitus with hyperlipidemia (HCC) Diabetes has not been well controlled.   Reviewed medications he should be taking.  Onglyza is no longer covered, so will change to Januvia.  Rx for Dexcom to closely monitor glucose at home Dietary recommendations reviewed and provided with handout.    Meds ordered this encounter  Medications  . sitaGLIPtin (JANUVIA) 100 MG tablet    Sig: Take 1 tablet (100 mg total) by mouth daily.    Dispense:  90 tablet    Refill:  2  . Continuous Blood Gluc Receiver (DEXCOM G6 RECEIVER) DEVI    Sig: Use to monitor glucose daily    Dispense:  1 each    Refill:  0  . Continuous Blood Gluc Sensor (DEXCOM G6 SENSOR) MISC    Sig: Use to monitor glucose daily    Dispense:  6 each    Refill:  1  . Continuous Blood Gluc Transmit (DEXCOM G6 TRANSMITTER) MISC    Sig: Use to monitor glucose daily    Dispense:  1 each    Refill:  2    No follow-ups on file.    This visit occurred during the SARS-CoV-2 public health emergency.  Safety protocols were in place, including screening questions prior to the visit, additional usage of staff PPE, and extensive cleaning of exam room while  observing appropriate contact time as indicated for disinfecting solutions.

## 2020-01-25 ENCOUNTER — Encounter: Payer: Self-pay | Admitting: Family Medicine

## 2020-01-29 ENCOUNTER — Telehealth: Payer: Self-pay

## 2020-01-29 NOTE — Telephone Encounter (Signed)
Dexcom G4 Receiver, Manufacturing systems engineer PA submitted through [employer].

## 2020-01-31 ENCOUNTER — Encounter: Payer: Self-pay | Admitting: Family Medicine

## 2020-02-13 NOTE — Telephone Encounter (Signed)
PA for Dexcom denied. Will place in basket.

## 2020-04-03 DIAGNOSIS — E291 Testicular hypofunction: Secondary | ICD-10-CM | POA: Diagnosis not present

## 2020-04-03 DIAGNOSIS — N5201 Erectile dysfunction due to arterial insufficiency: Secondary | ICD-10-CM | POA: Diagnosis not present

## 2020-04-03 DIAGNOSIS — Z125 Encounter for screening for malignant neoplasm of prostate: Secondary | ICD-10-CM | POA: Diagnosis not present

## 2020-04-14 ENCOUNTER — Ambulatory Visit: Payer: BC Managed Care – PPO | Admitting: Family Medicine

## 2020-04-23 ENCOUNTER — Ambulatory Visit: Payer: BC Managed Care – PPO | Admitting: Family Medicine

## 2020-04-24 ENCOUNTER — Encounter: Payer: Self-pay | Admitting: Family Medicine

## 2020-04-24 ENCOUNTER — Telehealth (INDEPENDENT_AMBULATORY_CARE_PROVIDER_SITE_OTHER): Payer: BC Managed Care – PPO | Admitting: Family Medicine

## 2020-04-24 DIAGNOSIS — U071 COVID-19: Secondary | ICD-10-CM | POA: Diagnosis not present

## 2020-04-24 MED ORDER — BENZONATATE 200 MG PO CAPS: 200.0000 mg | ORAL_CAPSULE | Freq: Three times a day (TID) | ORAL | 0 refills | Status: DC | PRN

## 2020-04-24 NOTE — Progress Notes (Addendum)
[name] - 60 y.o. male MRN [id number]  Date of birth: 1960/05/14   This visit type was conducted due to national recommendations for restrictions regarding the COVID-19 Pandemic (e.g. social distancing).  This format is felt to be most appropriate for this patient at this time.  All issues noted in this document were discussed and addressed.  No physical exam was performed (except for noted visual exam findings with Video Visits).  I discussed the limitations of evaluation and management by telemedicine and the availability of in person appointments. The patient expressed understanding and agreed to proceed.  I connected with@ on 04/24/20 at 11:10 AM EST by a video enabled telemedicine application and verified that I am speaking with the correct person using two identifiers.  Interactive audio and video telecommunications were attempted between this provider and patient, however failed, due to patient having technical difficulties OR patient did not have access to video capability.  We continued and completed visit with audio only.   Present at visit: Luetta Nutting, DO Tawanna Solo   Patient Location: Home [address] [place] [address]   Provider location:   [place] Complaint  Patient presents with  . Cough    HPI  [name] is a 60 y.o. male who presents via audio/video conferencing for a telehealth visit today.  He has complaint today of cough.  Reports that he was diagnosed with COVID on December 27.  He had fever as well as congestion, body aches, shortness of breath, fatigue and cough.  Symptoms for the most part have improved.  He still has some mild exertional dyspnea he has not had any further fever.  He denies chest pain or palpitations.  He continues to have cough which he finds pretty bothersome.   ROS:  A comprehensive ROS was completed and negative except as noted per HPI  Past Medical History:  Diagnosis Date  . Allergy   . Cervical  radiculopathy 08/09/2017  . Diabetes mellitus without complication ([place])   . Stroke (cerebrum) ([place]) 08/03/2017  . Type 2 diabetes mellitus with hyperlipidemia [facility])     Past Surgical History:  Procedure Laterality Date  . ESOPHAGOGASTRODUODENOSCOPY (EGD) WITH PROPOFOL N/A 05/09/2015   Procedure: ESOPHAGOGASTRODUODENOSCOPY (EGD) WITH PROPOFOL;  Surgeon: Gatha Mayer, MD;  [facility];  Service: Endoscopy;  Laterality: N/A;  with esophageal dilation  . LASER ABLATION Left 02/03/15  . VARICOSE VEIN SURGERY      Family History  Problem Relation Age of Onset  . Parkinson's disease Father   . Heart attack Maternal Grandmother   . Diabetes Neg Hx     Social History   Socioeconomic History  . Marital status: Married    Spouse name: Not on file  . Number of children: Not on file  . Years of education: Not on file  . Highest education level: Not on file  Occupational History  . Not on file  Tobacco Use  . Smoking status: Never Smoker  . Smokeless tobacco: Never Used  Vaping Use  . Vaping Use: Never used  Substance and Sexual Activity  . Alcohol use: No    Alcohol/week: 0.0 standard drinks  . Drug use: No  . Sexual activity: Never  Other Topics Concern  . Not on file  Social History Narrative  . Not on file   Social Determinants of Health   Financial Resource Strain: Not on file  Food Insecurity: Not on file  Transportation Needs: Not on  file  Physical Activity: Not on file  Stress: Not on file  Social Connections: Not on file  Intimate Partner Violence: Not on file     Current Outpatient Medications:  .  Blood Glucose Monitoring Suppl (CONTOUR BLOOD GLUCOSE SYSTEM) w/Device KIT, 1 each by Does not apply route daily. Use device to monitor glucose levels daily; E11.69; Please provide with Contour device that is covered by pt insurance, Disp: 1 each, Rfl: each .  Chromium-Cinnamon (CINNAMON PLUS CHROMIUM) [phone] MCG-MG CAPS, Take 1,000 mg by mouth daily. ,  Disp: , Rfl:  .  Continuous Blood Gluc Receiver ([place]) DEVI, Use to monitor glucose daily, Disp: 1 each, Rfl: 0 .  Continuous Blood Gluc Sensor (DEXCOM G6 SENSOR) MISC, Use to monitor glucose daily, Disp: 6 each, Rfl: 1 .  Continuous Blood Gluc Transmit (DEXCOM G6 TRANSMITTER) MISC, Use to monitor glucose daily, Disp: 1 each, Rfl: 2 .  dapagliflozin propanediol (FARXIGA) 5 MG TABS tablet, Take 1 tablet (5 mg total) by mouth daily., Disp: 90 tablet, Rfl: 3 .  Elastic Bandages & Supports (MEDICAL COMPRESSION THIGH HIGH) MISC, 20-39mmHG, Disp: 1 each, Rfl: 0 .  gabapentin (NEURONTIN) 300 MG capsule, Take 1 capsule (300 mg total) by mouth 3 (three) times daily. For continued shingles pain, Disp: 90 capsule, Rfl: 1 .  glimepiride (AMARYL) 2 MG tablet, Take 1 tablet (2 mg total) by mouth daily before breakfast., Disp: 90 tablet, Rfl: 3 .  glucose blood (CONTOUR TEST) test strip, 1 each by Other route daily. And lancets 1/day, Disp: 100 each, Rfl: 12 .  Magnesium 250 MG TABS, Take 250 mg by mouth daily., Disp: , Rfl:  .  metFORMIN (GLUCOPHAGE-XR) 500 MG 24 hr tablet, Take 4 tablets (2,000 mg total) by mouth daily., Disp: 360 tablet, Rfl: 3 .  MICROLET LANCETS MISC, 1 each by Does not apply route daily. Use to monitor glucose levels daily; E11.69; Please provide lancets compatible for Contour device that is covered by pt insurance, Disp: 100 each, Rfl: 3 .  Multiple Vitamin (MULTIVITAMIN) tablet, Take 1 tablet by mouth daily., Disp: , Rfl:  .  sitaGLIPtin (JANUVIA) 100 MG tablet, Take 1 tablet (100 mg total) by mouth daily., Disp: 90 tablet, Rfl: 2 .  testosterone cypionate (DEPOTESTOSTERONE CYPIONATE) 200 MG/ML injection, Inject 200 mg into the muscle once a week. , Disp: , Rfl:  .  B-D 3CC LUER-LOK SYR 22GX1-1/2 22G X 1-1/2" 3 ML MISC, , Disp: , Rfl:  .  benzonatate (TESSALON) 200 MG capsule, Take 1 capsule (200 mg total) by mouth 3 (three) times daily as needed for cough., Disp: 40 capsule,  Rfl: 0 .  tadalafil (CIALIS) 5 MG tablet, Take 5 mg by mouth daily., Disp: , Rfl:   EXAM:  VITALS per patient if applicable: Temp 87.6 F (36.7 C)   Wt 225 lb (102.1 kg)   BMI 27.98 kg/m   GENERAL: alert, oriented, appears well and in no acute distress  HEENT: atraumatic, conjunttiva clear, no obvious abnormalities on inspection of external nose and ears  NECK: normal movements of the head and neck  LUNGS: on inspection no signs of respiratory distress, breathing rate appears normal, no obvious gross SOB, gasping or wheezing  CV: no obvious cyanosis  MS: moves all visible extremities without noticeable abnormality  PSYCH/NEURO: pleasant and cooperative, no obvious depression or anxiety, speech and thought processing grossly intact  ASSESSMENT AND PLAN:  Discussed the following assessment and plan:  COVID-19 The majority of his  symptoms have improved at this point however he continues to have lingering cough.  I discussed with him that he may have cough that persists for several days to weeks.  Still has some mild exertional dyspnea.  Discussed that if symptoms are worsening significantly to contact the clinic.  Recommend continued supportive care with increased fluids.  He has been drinking quite a bit of fruit juices and sweet tea.  He has not been taking medications for diabetes or checking his blood sugars.  I recommended that he continue medications and monitor blood sugars closely.  He should use water or unsweetened beverages for hydration.  I have sent Tessalon Perles and for his cough.  He may also utilize over-the-counter cough syrup.     I discussed the assessment and treatment plan with the patient. The patient was provided an opportunity to ask questions and all were answered. The patient agreed with the plan and demonstrated an understanding of the instructions.   The patient was advised to call back or seek an in-person evaluation if the symptoms worsen or if the  condition fails to improve as anticipated.  30 minutes spent including pre visit preparation, review of prior notes and labs, encounter with patient via telephone visit and same day documentation.   Luetta Nutting, DO

## 2020-04-24 NOTE — Progress Notes (Signed)
Symptoms started prior to Christmas.   Meds: cough medicine (helped some)  Having sinus problems now along with non-productive cough.  No vaccines.

## 2020-04-24 NOTE — Assessment & Plan Note (Signed)
The majority of his symptoms have improved at this point however he continues to have lingering cough.  I discussed with him that he may have cough that persists for several days to weeks.  Still has some mild exertional dyspnea.  Discussed that if symptoms are worsening significantly to contact the clinic.  Recommend continued supportive care with increased fluids.  He has been drinking quite a bit of fruit juices and sweet tea.  He has not been taking medications for diabetes or checking his blood sugars.  I recommended that he continue medications and monitor blood sugars closely.  He should use water or unsweetened beverages for hydration.  I have sent Tessalon Perles and for his cough.  He may also utilize over-the-counter cough syrup.

## 2020-06-19 ENCOUNTER — Telehealth: Payer: Self-pay | Admitting: Oncology

## 2020-06-19 NOTE — Telephone Encounter (Signed)
Received a new hem referral from Dr. Mena Goes for testosterone deficiency on T replacement with persistent polycythema. Pt has been cld and scheduled to see Dr. Clelia Croft on 3/17 at 2pm. Pt aware to arrive 20 minutes early.

## 2020-07-01 ENCOUNTER — Telehealth: Payer: Self-pay | Admitting: Oncology

## 2020-07-01 NOTE — Telephone Encounter (Signed)
Received a call from [name] to r/s his new hem referral w/Dr. Clelia Croft to 3/31 at 11am.

## 2020-07-03 ENCOUNTER — Encounter: Payer: BC Managed Care – PPO | Admitting: Oncology

## 2020-07-15 ENCOUNTER — Telehealth: Payer: Self-pay | Admitting: Oncology

## 2020-07-15 NOTE — Telephone Encounter (Signed)
[name] cld to reschedule his new hem appt w/dr. Clelia Croft to 4/7 at 11am.

## 2020-07-17 ENCOUNTER — Encounter: Payer: BC Managed Care – PPO | Admitting: Oncology

## 2020-07-24 ENCOUNTER — Inpatient Hospital Stay: Payer: BC Managed Care – PPO | Attending: Oncology | Admitting: Oncology

## 2020-08-12 DIAGNOSIS — R079 Chest pain, unspecified: Secondary | ICD-10-CM | POA: Diagnosis not present

## 2020-08-12 DIAGNOSIS — M65841 Other synovitis and tenosynovitis, right hand: Secondary | ICD-10-CM | POA: Diagnosis not present

## 2020-08-12 DIAGNOSIS — R0600 Dyspnea, unspecified: Secondary | ICD-10-CM | POA: Diagnosis not present

## 2020-08-12 DIAGNOSIS — M12541 Traumatic arthropathy, right hand: Secondary | ICD-10-CM | POA: Diagnosis not present

## 2020-08-15 ENCOUNTER — Inpatient Hospital Stay: Payer: BC Managed Care – PPO | Admitting: Oncology

## 2020-08-20 DIAGNOSIS — R0600 Dyspnea, unspecified: Secondary | ICD-10-CM | POA: Diagnosis not present

## 2020-08-20 DIAGNOSIS — R079 Chest pain, unspecified: Secondary | ICD-10-CM | POA: Diagnosis not present

## 2020-09-11 DIAGNOSIS — R079 Chest pain, unspecified: Secondary | ICD-10-CM | POA: Diagnosis not present

## 2020-09-11 DIAGNOSIS — E785 Hyperlipidemia, unspecified: Secondary | ICD-10-CM | POA: Diagnosis not present

## 2020-09-11 DIAGNOSIS — R0600 Dyspnea, unspecified: Secondary | ICD-10-CM | POA: Diagnosis not present

## 2020-09-17 DIAGNOSIS — R0600 Dyspnea, unspecified: Secondary | ICD-10-CM | POA: Diagnosis not present

## 2020-09-17 DIAGNOSIS — R079 Chest pain, unspecified: Secondary | ICD-10-CM | POA: Diagnosis not present

## 2020-09-30 DIAGNOSIS — R079 Chest pain, unspecified: Secondary | ICD-10-CM | POA: Diagnosis not present

## 2020-09-30 DIAGNOSIS — E785 Hyperlipidemia, unspecified: Secondary | ICD-10-CM | POA: Insufficient documentation

## 2020-09-30 DIAGNOSIS — R0600 Dyspnea, unspecified: Secondary | ICD-10-CM | POA: Diagnosis not present

## 2020-10-02 DIAGNOSIS — H524 Presbyopia: Secondary | ICD-10-CM | POA: Diagnosis not present

## 2020-10-02 DIAGNOSIS — E119 Type 2 diabetes mellitus without complications: Secondary | ICD-10-CM | POA: Diagnosis not present

## 2020-10-02 LAB — HM DIABETES EYE EXAM

## 2020-10-03 ENCOUNTER — Encounter: Payer: Self-pay | Admitting: Family Medicine

## 2020-11-14 ENCOUNTER — Telehealth (INDEPENDENT_AMBULATORY_CARE_PROVIDER_SITE_OTHER): Payer: BC Managed Care – PPO | Admitting: Family Medicine

## 2020-11-14 ENCOUNTER — Encounter: Payer: Self-pay | Admitting: Family Medicine

## 2020-11-14 DIAGNOSIS — U071 COVID-19: Secondary | ICD-10-CM | POA: Diagnosis not present

## 2020-11-14 MED ORDER — BENZONATATE 200 MG PO CAPS: 200.0000 mg | ORAL_CAPSULE | Freq: Three times a day (TID) | ORAL | 0 refills | Status: DC | PRN

## 2020-11-14 MED ORDER — NIRMATRELVIR/RITONAVIR (PAXLOVID)TABLET: 3.0000 | ORAL_TABLET | Freq: Two times a day (BID) | ORAL | 0 refills | Status: AC

## 2020-11-14 NOTE — Progress Notes (Signed)
Symptoms: headache, ear pain, sore throat, congestion, difficulty breathing COVID: 11/11/2020 tested positive.

## 2020-11-16 NOTE — Progress Notes (Signed)
[name] - 60 y.o. male MRN [id number]  Date of birth: 17-Feb-1961   This visit type was conducted due to national recommendations for restrictions regarding the COVID-19 Pandemic (e.g. social distancing).  This format is felt to be most appropriate for this patient at this time.  All issues noted in this document were discussed and addressed.  No physical exam was performed (except for noted visual exam findings with Video Visits).  I discussed the limitations of evaluation and management by telemedicine and the availability of in person appointments. The patient expressed understanding and agreed to proceed.  I connected withNAME@ on 11/16/20 at  9:10 AM EDT by a video enabled telemedicine application and verified that I am speaking with the correct person using two identifiers.  Present at visit: Ricky Nutting, DO [name]   Patient Location: Home [address] [place] [address]   Provider location:   [facility]  Chief Complaint  Patient presents with   Covid Positive    HPI  [name] is a 60 y.o. male who presents via audio/video conferencing for a telehealth visit today.  Reports testing positive for COVID at home.  He has had congestion, fever, cough, mild dyspnea, headache, ear pain.  He has not had any difficulty breathing.  He denies chest pain.  He is using over-the-counter cold medication.  He would be interested and additional therapeutics to help with his symptoms.   ROS:  A comprehensive ROS was completed and negative except as noted per HPI  Past Medical History:  Diagnosis Date   Allergy    Cervical radiculopathy 08/09/2017   Diabetes mellitus without complication ([place])    Stroke (cerebrum) ([place]) 08/03/2017   Type 2 diabetes mellitus with hyperlipidemia ([place])     Past Surgical History:  Procedure Laterality Date   ESOPHAGOGASTRODUODENOSCOPY (EGD) WITH PROPOFOL N/A 05/09/2015   Procedure: ESOPHAGOGASTRODUODENOSCOPY (EGD) WITH PROPOFOL;   Surgeon: Gatha Mayer, MD;  [facility];  Service: Endoscopy;  Laterality: N/A;  with esophageal dilation   LASER ABLATION Left 02/03/15   VARICOSE VEIN SURGERY      Family History  Problem Relation Age of Onset   Parkinson's disease Father    Heart attack Maternal Grandmother    Diabetes Neg Hx     Social History   Socioeconomic History   Marital status: Married    Spouse name: Not on file   Number of children: Not on file   Years of education: Not on file   Highest education level: Not on file  Occupational History   Not on file  Tobacco Use   Smoking status: Never   Smokeless tobacco: Never  Vaping Use   Vaping Use: Never used  Substance and Sexual Activity   Alcohol use: No    Alcohol/week: 0.0 standard drinks   Drug use: No   Sexual activity: Never  Other Topics Concern   Not on file  Social History Narrative   Not on file   Social Determinants of Health   Financial Resource Strain: Not on file  Food Insecurity: Not on file  Transportation Needs: Not on file  Physical Activity: Not on file  Stress: Not on file  Social Connections: Not on file  Intimate Partner Violence: Not on file     Current Outpatient Medications:    B-D 3CC LUER-LOK SYR 22GX1-1/2 22G X 1-1/2" 3 ML MISC, , Disp: , Rfl:    benzonatate (TESSALON) 200 MG capsule, Take 1 capsule (200  mg total) by mouth 3 (three) times daily as needed for cough., Disp: 40 capsule, Rfl: 0   Blood Glucose Monitoring Suppl (CONTOUR BLOOD GLUCOSE SYSTEM) w/Device KIT, 1 each by Does not apply route daily. Use device to monitor glucose levels daily; E11.69; Please provide with Contour device that is covered by pt insurance, Disp: 1 each, Rfl: each   Chromium-Cinnamon (CINNAMON PLUS CHROMIUM) [id number] MCG-MG CAPS, Take 1,000 mg by mouth daily. , Disp: , Rfl:    Continuous Blood Gluc Receiver ([place]) DEVI, Use to monitor glucose daily, Disp: 1 each, Rfl: 0   Continuous Blood Gluc Sensor  (DEXCOM G6 SENSOR) MISC, Use to monitor glucose daily, Disp: 6 each, Rfl: 1   Continuous Blood Gluc Transmit (DEXCOM G6 TRANSMITTER) MISC, Use to monitor glucose daily, Disp: 1 each, Rfl: 2   dapagliflozin propanediol (FARXIGA) 5 MG TABS tablet, Take 1 tablet (5 mg total) by mouth daily., Disp: 90 tablet, Rfl: 3   Elastic Bandages & Supports (MEDICAL COMPRESSION THIGH HIGH) MISC, 20-40mHG, Disp: 1 each, Rfl: 0   gabapentin (NEURONTIN) 300 MG capsule, Take 1 capsule (300 mg total) by mouth 3 (three) times daily. For continued shingles pain, Disp: 90 capsule, Rfl: 1   glimepiride (AMARYL) 2 MG tablet, Take 1 tablet (2 mg total) by mouth daily before breakfast., Disp: 90 tablet, Rfl: 3   glucose blood (CONTOUR TEST) test strip, 1 each by Other route daily. And lancets 1/day, Disp: 100 each, Rfl: 12   Magnesium 250 MG TABS, Take 250 mg by mouth daily., Disp: , Rfl:    metFORMIN (GLUCOPHAGE-XR) 500 MG 24 hr tablet, Take 4 tablets (2,000 mg total) by mouth daily., Disp: 360 tablet, Rfl: 3   MICROLET LANCETS MISC, 1 each by Does not apply route daily. Use to monitor glucose levels daily; E11.69; Please provide lancets compatible for Contour device that is covered by pt insurance, Disp: 100 each, Rfl: 3   Multiple Vitamin (MULTIVITAMIN) tablet, Take 1 tablet by mouth daily., Disp: , Rfl:    nirmatrelvir/ritonavir EUA (PAXLOVID) TABS, Take 3 tablets by mouth 2 (two) times daily for 5 days. (Take nirmatrelvir 150 mg two tablets twice daily for 5 days and ritonavir 100 mg one tablet twice daily for 5 days) Patient GFR is 82, Disp: 30 tablet, Rfl: 0   sitaGLIPtin (JANUVIA) 100 MG tablet, Take 1 tablet (100 mg total) by mouth daily., Disp: 90 tablet, Rfl: 2   tadalafil (CIALIS) 5 MG tablet, Take 5 mg by mouth daily., Disp: , Rfl:    testosterone cypionate (DEPOTESTOSTERONE CYPIONATE) 200 MG/ML injection, Inject 200 mg into the muscle once a week. , Disp: , Rfl:   EXAM:  VITALS per patient if applicable: Ht 6'  3" (12.595m)   Wt 225 lb (102.1 kg)   BMI 28.12 kg/m   GENERAL: alert, oriented, appears well and in no acute distress  HEENT: atraumatic, conjunttiva clear, no obvious abnormalities on inspection of external nose and ears  NECK: normal movements of the head and neck  LUNGS: on inspection no signs of respiratory distress, breathing rate appears normal, no obvious gross SOB, gasping or wheezing  CV: no obvious cyanosis  MS: moves all visible extremities without noticeable abnormality  PSYCH/NEURO: pleasant and cooperative, no obvious depression or anxiety, speech and thought processing grossly intact  ASSESSMENT AND PLAN:  Discussed the following assessment and plan:  COVID-19 Positive home test and symptoms that are consistent with COVID-19.  Recommend continued supportive care at home  with increased fluids and rest.  He may use Tylenol as needed for fever.  I would but I sent in a course of Paxlovid over it as well x5 days for him to complete.  I have sent Tessalon Perles as needed for cough.  I am also sending Tessalon Perles as needed for cough.  Instructed to seek emergency care for worsening difficulty breathing and or difficulty remaining hydrated.     I discussed the assessment and treatment plan with the patient. The patient was provided an opportunity to ask questions and all were answered. The patient agreed with the plan and demonstrated an understanding of the instructions.   The patient was advised to call back or seek an in-person evaluation if the symptoms worsen or if the condition fails to improve as anticipated.    Ricky Nutting, DO

## 2020-11-16 NOTE — Assessment & Plan Note (Signed)
Positive home test and symptoms that are consistent with COVID-19.  Recommend continued supportive care at home with increased fluids and rest.  He may use Tylenol as needed for fever.  I would but I sent in a course of Paxlovid over it as well x5 days for him to complete.  I have sent Tessalon Perles as needed for cough.  I am also sending Tessalon Perles as needed for cough.  Instructed to seek emergency care for worsening difficulty breathing and or difficulty remaining hydrated.

## 2020-11-22 DIAGNOSIS — Z20822 Contact with and (suspected) exposure to covid-19: Secondary | ICD-10-CM | POA: Diagnosis not present

## 2020-11-22 DIAGNOSIS — J029 Acute pharyngitis, unspecified: Secondary | ICD-10-CM | POA: Diagnosis not present

## 2021-01-16 DIAGNOSIS — R0600 Dyspnea, unspecified: Secondary | ICD-10-CM | POA: Diagnosis not present

## 2021-01-16 DIAGNOSIS — E785 Hyperlipidemia, unspecified: Secondary | ICD-10-CM | POA: Diagnosis not present

## 2021-01-16 DIAGNOSIS — R079 Chest pain, unspecified: Secondary | ICD-10-CM | POA: Diagnosis not present

## 2021-01-23 DIAGNOSIS — E291 Testicular hypofunction: Secondary | ICD-10-CM | POA: Diagnosis not present

## 2021-01-23 DIAGNOSIS — N5201 Erectile dysfunction due to arterial insufficiency: Secondary | ICD-10-CM | POA: Diagnosis not present

## 2021-01-29 ENCOUNTER — Ambulatory Visit: Payer: BC Managed Care – PPO | Admitting: Family Medicine

## 2021-01-29 ENCOUNTER — Other Ambulatory Visit: Payer: Self-pay

## 2021-01-29 ENCOUNTER — Encounter: Payer: Self-pay | Admitting: Family Medicine

## 2021-01-29 VITALS — BP 134/76 | HR 92 | Temp 97.6°F | Ht 75.0 in | Wt 225.4 lb

## 2021-01-29 DIAGNOSIS — Z23 Encounter for immunization: Secondary | ICD-10-CM | POA: Diagnosis not present

## 2021-01-29 DIAGNOSIS — E785 Hyperlipidemia, unspecified: Secondary | ICD-10-CM

## 2021-01-29 DIAGNOSIS — S60562A Insect bite (nonvenomous) of left hand, initial encounter: Secondary | ICD-10-CM

## 2021-01-29 DIAGNOSIS — E119 Type 2 diabetes mellitus without complications: Secondary | ICD-10-CM | POA: Diagnosis not present

## 2021-01-29 DIAGNOSIS — B0229 Other postherpetic nervous system involvement: Secondary | ICD-10-CM | POA: Diagnosis not present

## 2021-01-29 DIAGNOSIS — W57XXXA Bitten or stung by nonvenomous insect and other nonvenomous arthropods, initial encounter: Secondary | ICD-10-CM

## 2021-01-29 DIAGNOSIS — E1169 Type 2 diabetes mellitus with other specified complication: Secondary | ICD-10-CM

## 2021-01-29 LAB — POCT GLYCOSYLATED HEMOGLOBIN (HGB A1C): HbA1c, POC (controlled diabetic range): 11.4 % — AB (ref 0.0–7.0)

## 2021-01-29 MED ORDER — RYBELSUS 3 MG PO TABS: 3.0000 mg | ORAL_TABLET | Freq: Every day | ORAL | 0 refills | Status: DC

## 2021-01-29 MED ORDER — RYBELSUS 7 MG PO TABS: 7.0000 mg | ORAL_TABLET | Freq: Every day | ORAL | 3 refills | Status: DC

## 2021-01-29 MED ORDER — TRIAMCINOLONE ACETONIDE 0.1 % EX CREA: 1.0000 "application " | TOPICAL_CREAM | Freq: Two times a day (BID) | CUTANEOUS | 0 refills | Status: DC

## 2021-01-29 MED ORDER — DAPAGLIFLOZIN PROPANEDIOL 5 MG PO TABS: 5.0000 mg | ORAL_TABLET | Freq: Every day | ORAL | 3 refills | Status: DC

## 2021-01-29 MED ORDER — METFORMIN HCL ER 500 MG PO TB24: 2000.0000 mg | ORAL_TABLET | Freq: Every day | ORAL | 3 refills | Status: DC

## 2021-01-29 NOTE — Assessment & Plan Note (Signed)
Continues to have some intermittent pain related to this.  He can use topical capsaicin.  If this is not effective we can add back on gabapentin.

## 2021-01-29 NOTE — Patient Instructions (Signed)
A1c is up today.  Stop Januvia.  Start Rybelsus.  Take 3mg  daily for the first month then increase to 7mg .  Continue metformin and farxiga.   Try triamcinolone cream on ant bites.   Try metamucil or benefiber daily for constipation.     Diabetes Mellitus and Nutrition, Adult When you have diabetes, or diabetes mellitus, it is very important to have healthy eating habits because your blood sugar (glucose) levels are greatly affected by what you eat and drink. Eating healthy foods in the right amounts, at about the same times every day, can help you: Control your blood glucose. Lower your risk of heart disease. Improve your blood pressure. Reach or maintain a healthy weight. What can affect my meal plan? Every person with diabetes is different, and each person has different needs for a meal plan. Your health care provider may recommend that you work with a dietitian to make a meal plan that is best for you. Your meal plan may vary depending on factors such as: The calories you need. The medicines you take. Your weight. Your blood glucose, blood pressure, and cholesterol levels. Your activity level. Other health conditions you have, such as heart or kidney disease. How do carbohydrates affect me? Carbohydrates, also called carbs, affect your blood glucose level more than any other type of food. Eating carbs naturally raises the amount of glucose in your blood. Carb counting is a method for keeping track of how many carbs you eat. Counting carbs is important to keep your blood glucose at a healthy level, especially if you use insulin or take certain oral diabetes medicines. It is important to know how many carbs you can safely have in each meal. This is different for every person. Your dietitian can help you calculate how many carbs you should have at each meal and for each snack. How does alcohol affect me? Alcohol can cause a sudden decrease in blood glucose (hypoglycemia), especially if  you use insulin or take certain oral diabetes medicines. Hypoglycemia can be a life-threatening condition. Symptoms of hypoglycemia, such as sleepiness, dizziness, and confusion, are similar to symptoms of having too much alcohol. Do not drink alcohol if: Your health care provider tells you not to drink. You are pregnant, may be pregnant, or are planning to become pregnant. If you drink alcohol: Do not drink on an empty stomach. Limit how much you use to: 0-1 drink a day for women. 0-2 drinks a day for men. Be aware of how much alcohol is in your drink. In the [place] one drink equals one 12 oz bottle of beer (355 mL), one 5 oz glass of wine (148 mL), or one 1 oz glass of hard liquor (44 mL). Keep yourself hydrated with water, diet soda, or unsweetened iced tea. Keep in mind that regular soda, juice, and other mixers may contain a lot of sugar and must be counted as carbs. What are tips for following this plan? Reading food labels Start by checking the serving size on the "Nutrition Facts" label of packaged foods and drinks. The amount of calories, carbs, fats, and other nutrients listed on the label is based on one serving of the item. Many items contain more than one serving per package. Check the total grams (g) of carbs in one serving. You can calculate the number of servings of carbs in one serving by dividing the total carbs by 15. For example, if a food has 30 g of total carbs per serving, it would be equal  to 2 servings of carbs. Check the number of grams (g) of saturated fats and trans fats in one serving. Choose foods that have a low amount or none of these fats. Check the number of milligrams (mg) of salt (sodium) in one serving. Most people should limit total sodium intake to less than 2,300 mg per day. Always check the nutrition information of foods labeled as "low-fat" or "nonfat." These foods may be higher in added sugar or refined carbs and should be avoided. Talk to your dietitian  to identify your daily goals for nutrients listed on the label. Shopping Avoid buying canned, pre-made, or processed foods. These foods tend to be high in fat, sodium, and added sugar. Shop around the outside edge of the grocery store. This is where you will most often find fresh fruits and vegetables, bulk grains, fresh meats, and fresh dairy. Cooking Use low-heat cooking methods, such as baking, instead of high-heat cooking methods like deep frying. Cook using healthy oils, such as olive, canola, or sunflower oil. Avoid cooking with butter, cream, or high-fat meats. Meal planning Eat meals and snacks regularly, preferably at the same times every day. Avoid going long periods of time without eating. Eat foods that are high in fiber, such as fresh fruits, vegetables, beans, and whole grains. Talk with your dietitian about how many servings of carbs you can eat at each meal. Eat 4-6 oz (112-168 g) of lean protein each day, such as lean meat, chicken, fish, eggs, or tofu. One ounce (oz) of lean protein is equal to: 1 oz (28 g) of meat, chicken, or fish. 1 egg.  cup (62 g) of tofu. Eat some foods each day that contain healthy fats, such as avocado, nuts, seeds, and fish. What foods should I eat? Fruits Berries. Apples. Oranges. Peaches. Apricots. Plums. Grapes. Mango. Papaya. Pomegranate. Kiwi. Cherries. Vegetables Lettuce. Spinach. Leafy greens, including kale, chard, collard greens, and mustard greens. Beets. Cauliflower. Cabbage. Broccoli. Carrots. Green beans. Tomatoes. Peppers. Onions. Cucumbers. Brussels sprouts. Grains Whole grains, such as whole-wheat or whole-grain bread, crackers, tortillas, cereal, and pasta. Unsweetened oatmeal. Quinoa. Brown or wild rice. Meats and other proteins Seafood. Poultry without skin. Lean cuts of poultry and beef. Tofu. Nuts. Seeds. Dairy Low-fat or fat-free dairy products such as milk, yogurt, and cheese. The items listed above may not be a complete  list of foods and beverages you can eat. Contact a dietitian for more information. What foods should I avoid? Fruits Fruits canned with syrup. Vegetables Canned vegetables. Frozen vegetables with butter or cream sauce. Grains Refined white flour and flour products such as bread, pasta, snack foods, and cereals. Avoid all processed foods. Meats and other proteins Fatty cuts of meat. Poultry with skin. Breaded or fried meats. Processed meat. Avoid saturated fats. Dairy Full-fat yogurt, cheese, or milk. Beverages Sweetened drinks, such as soda or iced tea. The items listed above may not be a complete list of foods and beverages you should avoid. Contact a dietitian for more information. Questions to ask a health care provider Do I need to meet with a diabetes educator? Do I need to meet with a dietitian? What number can I call if I have questions? When are the best times to check my blood glucose? Where to find more information: American Diabetes Association: diabetes.org Academy of Nutrition and Dietetics: www.eatright.[employer] of Diabetes and Digestive and Kidney Diseases: CarFlippers.tn Association of Diabetes Care and Education Specialists: www.diabeteseducator.org Summary It is important to have healthy eating habits because  your blood sugar (glucose) levels are greatly affected by what you eat and drink. A healthy meal plan will help you control your blood glucose and maintain a healthy lifestyle. Your health care provider may recommend that you work with a dietitian to make a meal plan that is best for you. Keep in mind that carbohydrates (carbs) and alcohol have immediate effects on your blood glucose levels. It is important to count carbs and to use alcohol carefully. This information is not intended to replace advice given to you by your health care provider. Make sure you discuss any questions you have with your health care provider. Document Revised:  03/13/2019 Document Reviewed: 03/13/2019 Elsevier Patient Education  2021 [employer].

## 2021-01-29 NOTE — Assessment & Plan Note (Addendum)
Diabetes remains poorly controlled.  We discussed importance of following a low carbohydrate diet and staying active.  Discontinuing Januvia and replacing with Rybelsus with titration over the next month.  He will continue metformin and [place].  Cardiologist recently sent in new statin which he will add on.

## 2021-01-29 NOTE — Assessment & Plan Note (Signed)
Adding topical triamcinolone.  I recommend that he add Benadryl as needed.  Monitor for signs of infection.

## 2021-01-29 NOTE — Progress Notes (Signed)
[name] - 60 y.o. male MRN [id number]  Date of birth: Aug 01, 1960  Subjective Chief Complaint  Patient presents with   Diabetes    HPI [name] Stable is a 60 year old male here today for follow-up visit.  He has a history of type 2 diabetes.  He has had history of poor compliance with medication and diet for management of his diabetes.  He reports that he has been taking Januvia, metformin and Farxiga as directed.  He is not taking glimepiride.  He did recently have follow-up with his cardiologist.  He was initially started on atorvastatin but did not tolerate.  It sounds like a prescription for rosuvastatin has been sent in but he has not started this yet.   He also reports getting several fire ant bites yesterday.  He does have some mild swelling of his hand and arm.  He has not tried anything for this so far.  ROS:  A comprehensive ROS was completed and negative except as noted per HPI  Allergies  Allergen Reactions   Latex Rash    Past Medical History:  Diagnosis Date   Allergy    Cervical radiculopathy 08/09/2017   Diabetes mellitus without complication (HCC)    Stroke (cerebrum) (HCC) 08/03/2017   Type 2 diabetes mellitus with hyperlipidemia (HCC)     Past Surgical History:  Procedure Laterality Date   ESOPHAGOGASTRODUODENOSCOPY (EGD) WITH PROPOFOL N/A 05/09/2015   Procedure: ESOPHAGOGASTRODUODENOSCOPY (EGD) WITH PROPOFOL;  Surgeon: Iva Boop, MD;  Location: [facility];  Service: Endoscopy;  Laterality: N/A;  with esophageal dilation   LASER ABLATION Left 02/03/15   VARICOSE VEIN SURGERY      Social History   Socioeconomic History   Marital status: Married    Spouse name: Not on file   Number of children: Not on file   Years of education: Not on file   Highest education level: Not on file  Occupational History   Not on file  Tobacco Use   Smoking status: Never   Smokeless tobacco: Never  Vaping Use   Vaping Use: Never used  Substance and Sexual Activity    Alcohol use: No    Alcohol/week: 0.0 standard drinks   Drug use: No   Sexual activity: Never  Other Topics Concern   Not on file  Social History Narrative   Not on file   Social Determinants of Health   Financial Resource Strain: Not on file  Food Insecurity: Not on file  Transportation Needs: Not on file  Physical Activity: Not on file  Stress: Not on file  Social Connections: Not on file    Family History  Problem Relation Age of Onset   Parkinson's disease Father    Heart attack Maternal Grandmother    Diabetes Neg Hx     Health Maintenance  Topic Date Due   Hepatitis C Screening  Never done   COLONOSCOPY (Pts 45-79yrs Insurance coverage will need to be confirmed)  Never done   URINE MICROALBUMIN  01/13/2021   Zoster Vaccines- Shingrix (1 of 2) 05/01/2021 (Originally 01/13/2009)   HEMOGLOBIN A1C  07/30/2021   OPHTHALMOLOGY EXAM  10/02/2021   FOOT EXAM  01/29/2022   TETANUS/TDAP  01/30/2031   HIV Screening  Completed   HPV VACCINES  Aged Out   INFLUENZA VACCINE  Discontinued   COVID-19 Vaccine  Discontinued     ----------------------------------------------------------------------------------------------------------------------------------------------------------------------------------------------------------------- Physical Exam BP 134/76   Pulse 92   Temp 97.6 F (36.4 C)   Ht 6\' 3"  (1.905 m)  Wt 225 lb 6.4 oz (102.2 kg)   SpO2 94%   BMI 28.17 kg/m   Physical Exam Constitutional:      Appearance: Normal appearance.  Eyes:     General: No scleral icterus. Cardiovascular:     Rate and Rhythm: Normal rate and regular rhythm.  Pulmonary:     Effort: Pulmonary effort is normal.     Breath sounds: Normal breath sounds.  Musculoskeletal:     Cervical back: Neck supple.  Skin:    Comments: Several small erythematous pustules along the hand and arm consistent with insect bites.  No signs of associated cellulitis.  Neurological:     General: No focal  deficit present.     Mental Status: He is alert.  Psychiatric:        Mood and Affect: Mood normal.        Behavior: Behavior normal.    ------------------------------------------------------------------------------------------------------------------------------------------------------------------------------------------------------------------- Assessment and Plan  Type 2 diabetes mellitus with hyperlipidemia (HCC) Diabetes remains poorly controlled.  We discussed importance of following a low carbohydrate diet and staying active.  Discontinuing Januvia and replacing with Rybelsus with titration over the next month.  He will continue metformin and [place].  Cardiologist recently sent in new statin which he will add on.  Post herpetic neuralgia Continues to have some intermittent pain related to this.  He can use topical capsaicin.  If this is not effective we can add back on gabapentin.  Insect bites Adding topical triamcinolone.  I recommend that he add Benadryl as needed.  Monitor for signs of infection.   Meds ordered this encounter  Medications   triamcinolone cream (KENALOG) 0.1 %    Sig: Apply 1 application topically 2 (two) times daily.    Dispense:  30 g    Refill:  0   Semaglutide (RYBELSUS) 3 MG TABS    Sig: Take 3 mg by mouth daily. Take for 30 days and then start 7mg  tabs    Dispense:  30 tablet    Refill:  0   Semaglutide (RYBELSUS) 7 MG TABS    Sig: Take 7 mg by mouth daily.    Dispense:  30 tablet    Refill:  3   dapagliflozin propanediol (FARXIGA) 5 MG TABS tablet    Sig: Take 1 tablet (5 mg total) by mouth daily.    Dispense:  90 tablet    Refill:  3   metFORMIN (GLUCOPHAGE-XR) 500 MG 24 hr tablet    Sig: Take 4 tablets (2,000 mg total) by mouth daily.    Dispense:  360 tablet    Refill:  3    Return in about 3 months (around 05/01/2021) for DM.    This visit occurred during the SARS-CoV-2 public health emergency.  Safety protocols were in place,  including screening questions prior to the visit, additional usage of staff PPE, and extensive cleaning of exam room while observing appropriate contact time as indicated for disinfecting solutions.

## 2021-01-30 LAB — CBC WITH DIFFERENTIAL/PLATELET
Absolute Monocytes: 341 cells/uL (ref 200–950)
Basophils Absolute: 28 cells/uL (ref 0–200)
Basophils Relative: 0.5 %
Eosinophils Absolute: 61 cells/uL (ref 15–500)
Eosinophils Relative: 1.1 %
HCT: 49.6 % (ref 38.5–50.0)
Hemoglobin: 17 g/dL (ref 13.2–17.1)
Lymphs Abs: 1936 cells/uL (ref 850–3900)
MCH: 31.3 pg (ref 27.0–33.0)
MCHC: 34.3 g/dL (ref 32.0–36.0)
MCV: 91.2 fL (ref 80.0–100.0)
MPV: 11.3 fL (ref 7.5–12.5)
Monocytes Relative: 6.2 %
Neutro Abs: 3135 cells/uL (ref 1500–7800)
Neutrophils Relative %: 57 %
Platelets: 182 10*3/uL (ref 140–400)
RBC: 5.44 10*6/uL (ref 4.20–5.80)
RDW: 12.9 % (ref 11.0–15.0)
Total Lymphocyte: 35.2 %
WBC: 5.5 10*3/uL (ref 3.8–10.8)

## 2021-01-30 LAB — LIPID PANEL W/REFLEX DIRECT LDL
Cholesterol: 239 mg/dL — ABNORMAL HIGH (ref <200)
HDL: 48 mg/dL (ref >=40)
LDL Cholesterol (Calc): 155 mg/dL (calc) — ABNORMAL HIGH
Non-HDL Cholesterol (Calc): 191 mg/dL (calc) — ABNORMAL HIGH (ref <130)
Total CHOL/HDL Ratio: 5 (calc) — ABNORMAL HIGH (ref <5.0)
Triglycerides: 203 mg/dL — ABNORMAL HIGH (ref <150)

## 2021-01-30 LAB — MICROALBUMIN / CREATININE URINE RATIO
Creatinine, Urine: 70 mg/dL (ref 20–320)
Microalb Creat Ratio: 356 mcg/mg creat — ABNORMAL HIGH (ref <30)
Microalb, Ur: 24.9 mg/dL

## 2021-01-30 LAB — COMPLETE METABOLIC PANEL WITH GFR
AG Ratio: 1.6 (calc) (ref 1.0–2.5)
ALT: 18 U/L (ref 9–46)
AST: 14 U/L (ref 10–35)
Albumin: 4.3 g/dL (ref 3.6–5.1)
Alkaline phosphatase (APISO): 90 U/L (ref 35–144)
BUN: 20 mg/dL (ref 7–25)
CO2: 28 mmol/L (ref 20–32)
Calcium: 9.6 mg/dL (ref 8.6–10.3)
Chloride: 101 mmol/L (ref 98–110)
Creat: 0.85 mg/dL (ref 0.70–1.35)
Globulin: 2.7 g/dL (calc) (ref 1.9–3.7)
Glucose, Bld: 215 mg/dL — ABNORMAL HIGH (ref 65–99)
Potassium: 4.4 mmol/L (ref 3.5–5.3)
Sodium: 138 mmol/L (ref 135–146)
Total Bilirubin: 0.7 mg/dL (ref 0.2–1.2)
Total Protein: 7 g/dL (ref 6.1–8.1)
eGFR: 98 mL/min/{1.73_m2} (ref >=60)

## 2021-02-03 ENCOUNTER — Telehealth: Payer: Self-pay

## 2021-02-03 MED ORDER — TADALAFIL 5 MG PO TABS: 5.0000 mg | ORAL_TABLET | Freq: Every day | ORAL | 2 refills | Status: DC

## 2021-02-03 NOTE — Telephone Encounter (Signed)
[name] called and left a message stating he went to the pharmacy everyday since his appointment to pick up the Cialis. He states it has not been sent to the pharmacy. Pended prescription.

## 2021-02-05 ENCOUNTER — Telehealth: Payer: Self-pay

## 2021-02-05 DIAGNOSIS — Z9189 Other specified personal risk factors, not elsewhere classified: Secondary | ICD-10-CM

## 2021-02-05 NOTE — Progress Notes (Signed)
[facility])  [place] Team    02/05/2021  [name] October 06, 1960 [id number]  Reason for referral:  BCBS Utilization review: Members w/ A1c >8.5%  Referral source:  Payer Current insurance: [facility]  PMHx includes but not limited to:  Patient has a PMH significant for diabetes.  Outreach:  Successful telephone call with [name].  HIPAA identifiers verified.   Subjective:  Patient's diabetes has been uncontrolled yet worsened to 11.4%. At the last office visit, provider discontinued Januvia, noted patient was not taking glimepiride, and encouraged dietary changes. He was also started on Rybelsus 3 mg daily with the goal to increase to 7 mg one month thereafter. Current regimen for diabetes: Farxiga 5 mg daily (on file), Rybelsus 3 mg (for now), metformin XR 2000 mg daily.   Per [name], patient fill history rate is low for so some meds (highlighted below): - Atorvastatin 47% but patient switched to rosuvastatin on 9/30 - Metformin 71% (filled Janurary, June, and October for 90 d/s) - Farxiga 44% but there are two refill dates that are not recorded and could negatively effect score  Given above medication adherence rate, this pharmacist called patient to clarify compliance concern. Patient seemed occupied but stated he was able to answer questions. He reports being compliant with medications with no issue affording prescribed medicines.   Objective: The 10-year ASCVD risk score ([name], et al., 2019) is: 23.5%   Values used to calculate the score:     Age: 60 years     Sex: Male     Is Non-Hispanic African American: No     Diabetic: Yes     Tobacco smoker: No     Systolic Blood Pressure: 244 mmHg     Is BP treated: No     HDL Cholesterol: 48 mg/dL     Total Cholesterol: 239 mg/dL  Lab Results  Component Value Date   CREATININE 0.85 01/29/2021   CREATININE 1.12 01/14/2020   CREATININE 0.87 04/06/2018    Lab Results   Component Value Date   HGBA1C 11.4 (A) 01/29/2021    Lipid Panel     Component Value Date/Time   CHOL 239 (H) 01/29/2021 0000   TRIG 203 (H) 01/29/2021 0000   HDL 48 01/29/2021 0000   CHOLHDL 5.0 (H) 01/29/2021 0000   VLDL 64 (H) 08/04/2017 0317   LDLCALC 155 (H) 01/29/2021 0000    BP Readings from Last 3 Encounters:  01/29/21 134/76  01/24/20 127/69  01/14/20 136/85    Allergies  Allergen Reactions   Latex Rash    Medications Reviewed Today     Reviewed by [name], CMA (Certified Medical Assistant) on 01/29/21 at 0910  Med List Status: <None>   Medication Order Taking? Sig Documenting Provider Last Dose Status Informant  B-D 3CC LUER-LOK SYR 22GX1-1/2 22G X 1-1/2" 3 ML MISC [id number] Yes  [provider] Taking Active   Blood Glucose Monitoring Suppl (CONTOUR BLOOD GLUCOSE SYSTEM) w/Device KIT [id number] Yes 1 each by Does not apply route daily. Use device to monitor glucose levels daily; E11.69; Please provide with Contour device that is covered by pt insurance Ricky Shin, MD Taking Active   Continuous Blood Gluc Receiver ([name]) [place] [id number] Yes Use to monitor glucose daily Ricky Nutting, DO Taking Active   Continuous Blood Gluc Sensor ([place]) [place] [id number] Yes Use to monitor glucose daily Ricky Nutting, DO Taking Active   Continuous Blood Gluc Transmit (DEXCOM  G6 TRANSMITTER) MISC [id number] Yes Use to monitor glucose daily Ricky Nutting, DO Taking Active   dapagliflozin propanediol (FARXIGA) 5 MG TABS tablet [id number]  Take 1 tablet (5 mg total) by mouth daily. Ricky Nutting, DO  Active   Elastic Bandages & Supports (MEDICAL COMPRESSION THIGH HIGH) MISC [id number] Yes 20-60mmHG Ricky Nutting, DO Taking Active   gabapentin (NEURONTIN) 300 MG capsule [id number] No Take 1 capsule (300 mg total) by mouth 3 (three) times daily. For continued shingles pain  Patient not taking: Reported on 01/29/2021   Ricky Nutting, DO Not Taking  Active   glimepiride (AMARYL) 2 MG tablet [id number] No Take 1 tablet (2 mg total) by mouth daily before breakfast.  Patient not taking: Reported on 01/29/2021   Ricky Nutting, DO Not Taking Active   glucose blood (CONTOUR TEST) test strip [id number] Yes 1 each by Other route daily. And lancets 1/day Ricky Shin, MD Taking Active   Magnesium 250 MG TABS [id number] Yes Take 250 mg by mouth daily. [provider] Taking Active   metFORMIN (GLUCOPHAGE-XR) 500 MG 24 hr tablet [id number]  Take 4 tablets (2,000 mg total) by mouth daily. Ricky Nutting, DO  Active   MICROLET LANCETS [place] [id number] Yes 1 each by Does not apply route daily. Use to monitor glucose levels daily; E11.69; Please provide lancets compatible for Contour device that is covered by pt insurance Ricky Shin, MD Taking Active   Multiple Vitamin (MULTIVITAMIN) tablet [id number] Yes Take 1 tablet by mouth daily. [provider] Taking Active   Semaglutide (RYBELSUS) 3 MG TABS [id number] Yes Take 3 mg by mouth daily. Take for 30 days and then start $RemoveBe'7mg'OmqHodXiq$  tabs Ricky Nutting, DO  Active   Semaglutide (RYBELSUS) 7 MG TABS [id number] Yes Take 7 mg by mouth daily. Ricky Nutting, DO  Active   tadalafil (CIALIS) 5 MG tablet [id number] No Take 5 mg by mouth daily.  Patient not taking: Reported on 01/29/2021   [provider] Not Taking Active   testosterone cypionate (DEPOTESTOSTERONE CYPIONATE) 200 MG/ML injection [id number] No Inject 200 mg into the muscle once a week.   Patient not taking: Reported on 01/29/2021   [provider] Not Taking Active   testosterone cypionate (DEPOTESTOTERONE CYPIONATE) 100 MG/ML injection [id number] Yes SMARTSIG:0.5 Milliliter(s) IM Once a Week [provider] Taking Active   triamcinolone cream (KENALOG) 0.1 % [id number] Yes Apply 1 application topically 2 (two) times daily. Ricky Nutting, DO  Active             Assessment: Patient is not taking the max dose of  [place] (his current eGFR 98), but unsure if current dose is the max tolerated for this patient. Although patient denied compliance issue, may benefit from medication adherence follow-up at the next office visit. Given consistently uncontrolled diabetes, patient may benefit from close follow with a pharmacist. 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 However, this pharmacist searched the new CDL rule criteria and patients treated for diabetes with insulin is no longer an automatic exclusion. Provider will need to periodically assess control of diabetes while on insulin and documentation will need to be submitted; please see link for details. (Website link: https://diabetes.org/tools-support/know-your-rights/discrimination/drivers-licenses/commercial-drivers-license)   Plan: Will communicate with PCP to see if the patient would benefit from a referral for an embedded pharmacist to follow.   Thank you for allowing pharmacy to be a part of this patient's care.  Kristeen Miss, PharmD Clinical Pharmacist [place] [facility]: [phone]

## 2021-02-05 NOTE — Telephone Encounter (Signed)
Patient advised.

## 2021-02-09 ENCOUNTER — Other Ambulatory Visit: Payer: Self-pay

## 2021-02-10 ENCOUNTER — Other Ambulatory Visit: Payer: Self-pay | Admitting: Family Medicine

## 2021-02-10 MED ORDER — ROSUVASTATIN CALCIUM 10 MG PO TABS: 10.0000 mg | ORAL_TABLET | Freq: Every day | ORAL | 1 refills | Status: DC

## 2021-02-27 DIAGNOSIS — Z125 Encounter for screening for malignant neoplasm of prostate: Secondary | ICD-10-CM | POA: Diagnosis not present

## 2021-02-27 DIAGNOSIS — E291 Testicular hypofunction: Secondary | ICD-10-CM | POA: Diagnosis not present

## 2021-03-03 ENCOUNTER — Other Ambulatory Visit: Payer: Self-pay

## 2021-05-01 ENCOUNTER — Ambulatory Visit: Payer: BC Managed Care – PPO | Admitting: Family Medicine

## 2021-07-08 ENCOUNTER — Other Ambulatory Visit: Payer: Self-pay

## 2021-07-08 MED ORDER — RYBELSUS 7 MG PO TABS: 7.0000 mg | ORAL_TABLET | Freq: Every day | ORAL | 3 refills | Status: DC

## 2021-07-27 DIAGNOSIS — R0600 Dyspnea, unspecified: Secondary | ICD-10-CM | POA: Diagnosis not present

## 2021-07-27 DIAGNOSIS — R079 Chest pain, unspecified: Secondary | ICD-10-CM | POA: Diagnosis not present

## 2021-07-27 DIAGNOSIS — E785 Hyperlipidemia, unspecified: Secondary | ICD-10-CM | POA: Diagnosis not present

## 2021-08-20 ENCOUNTER — Ambulatory Visit: Payer: BC Managed Care – PPO | Admitting: Family Medicine

## 2021-09-02 ENCOUNTER — Telehealth: Payer: Self-pay | Admitting: Family Medicine

## 2021-09-02 NOTE — Telephone Encounter (Signed)
error 

## 2021-09-03 ENCOUNTER — Ambulatory Visit: Payer: BC Managed Care – PPO | Admitting: Family Medicine

## 2021-09-30 ENCOUNTER — Ambulatory Visit: Payer: BC Managed Care – PPO | Admitting: Family Medicine

## 2021-10-15 ENCOUNTER — Other Ambulatory Visit: Payer: Self-pay

## 2021-10-15 MED ORDER — ROSUVASTATIN CALCIUM 10 MG PO TABS: 10.0000 mg | ORAL_TABLET | Freq: Every day | ORAL | 1 refills | Status: DC

## 2021-11-02 ENCOUNTER — Encounter: Payer: Self-pay | Admitting: Family Medicine

## 2021-11-02 ENCOUNTER — Ambulatory Visit: Payer: BC Managed Care – PPO | Admitting: Family Medicine

## 2021-11-02 VITALS — BP 104/67 | HR 87 | Ht 75.0 in | Wt 224.0 lb

## 2021-11-02 DIAGNOSIS — Q984 Klinefelter syndrome, unspecified: Secondary | ICD-10-CM | POA: Diagnosis not present

## 2021-11-02 DIAGNOSIS — E785 Hyperlipidemia, unspecified: Secondary | ICD-10-CM

## 2021-11-02 DIAGNOSIS — E1169 Type 2 diabetes mellitus with other specified complication: Secondary | ICD-10-CM | POA: Diagnosis not present

## 2021-11-02 LAB — POCT GLYCOSYLATED HEMOGLOBIN (HGB A1C): HbA1c, POC (controlled diabetic range): 8.2 % — AB (ref 0.0–7.0)

## 2021-11-02 MED ORDER — DAPAGLIFLOZIN PROPANEDIOL 5 MG PO TABS: 5.0000 mg | ORAL_TABLET | Freq: Every day | ORAL | 3 refills | Status: DC

## 2021-11-02 MED ORDER — RYBELSUS 7 MG PO TABS: 7.0000 mg | ORAL_TABLET | Freq: Every day | ORAL | 3 refills | Status: DC

## 2021-11-02 MED ORDER — GLIMEPIRIDE 2 MG PO TABS: 2.0000 mg | ORAL_TABLET | Freq: Every day | ORAL | 3 refills | Status: DC

## 2021-11-02 NOTE — Assessment & Plan Note (Signed)
Management of TRT by urology.  Stable at this time.

## 2021-11-02 NOTE — Progress Notes (Signed)
[name] [name] - 61 y.o. [name] MRN [id number]  Date of birth: Dec 10, 1960  Subjective Chief Complaint  Patient presents with   Diabetes   Hypertension    HPI [name] [name] is [name] 61 year old [name] here today for follow-up visit.  Reports [name] [name] is doing well at this time.  [name] [name] has discontinued his metformin and started berberine to replace this.  [name] [name] was having GI upset with metformin.  [name] [name] continues on Rybelsus and [place].  [name] [name] does have an active prescription for glimepiride but does not think that [name] [name] is taking this.    [name] [name] is not currently taking rosuvastatin due to myalgias.  His urologist is managing his testosterone replacement.    ROS:  [name] comprehensive ROS was completed and negative except as noted per HPI    Allergies  Allergen Reactions   Latex Rash    Past Medical History:  Diagnosis Date   Allergy    Cervical radiculopathy 08/09/2017   Diabetes mellitus without complication (HCC)    Stroke (cerebrum) (HCC) 08/03/2017   Type 2 diabetes mellitus with hyperlipidemia (HCC)     Past Surgical History:  Procedure Laterality Date   ESOPHAGOGASTRODUODENOSCOPY (EGD) WITH PROPOFOL N/[name] 05/09/2015   Procedure: ESOPHAGOGASTRODUODENOSCOPY (EGD) WITH PROPOFOL;  Surgeon: Iva Boop, MD;  Location: [facility];  Service: Endoscopy;  Laterality: N/[name];  with esophageal dilation   LASER ABLATION Left 02/03/15   VARICOSE VEIN SURGERY      Social History   Socioeconomic History   Marital status: Married    Spouse name: Not on file   Number of children: Not on file   Years of education: Not on file   Highest education level: Not on file  Occupational History   Not on file  Tobacco Use   Smoking status: Never   Smokeless tobacco: Never  Vaping Use   Vaping Use: Never used  Substance and Sexual Activity   Alcohol use: No    Alcohol/week: 0.0 standard drinks of alcohol   Drug use: No   Sexual activity: Never  Other Topics Concern   Not on file  Social History Narrative   Not on file    Social Determinants of Health   Financial Resource Strain: Not on file  Food Insecurity: Not on file  Transportation Needs: Not on file  Physical Activity: Not on file  Stress: Not on file  Social Connections: Not on file    Family History  Problem Relation Age of Onset   Parkinson's disease Father    Heart attack Maternal Grandmother    Diabetes Neg Hx     Health Maintenance  Topic Date Due   OPHTHALMOLOGY EXAM  11/02/2021 (Originally 10/02/2021)   Zoster Vaccines- Shingrix (1 of 2) 02/02/2022 (Originally 01/13/2009)   COLONOSCOPY (Pts 45-68yrs Insurance coverage will need to be confirmed)  11/03/2022 (Originally 01/14/2004)   Hepatitis C Screening  11/03/2022 (Originally 01/13/1977)   Diabetic kidney evaluation - GFR measurement  01/29/2022   Diabetic kidney evaluation - Urine ACR  01/29/2022   FOOT EXAM  01/29/2022   HEMOGLOBIN A1C  05/05/2022   TETANUS/TDAP  01/30/2031   HIV Screening  Completed   HPV VACCINES  Aged Out   INFLUENZA VACCINE  Discontinued   COVID-19 Vaccine  Discontinued     ----------------------------------------------------------------------------------------------------------------------------------------------------------------------------------------------------------------- Physical Exam BP 104/67 (BP Location: Right Arm, Patient Position: Sitting, Cuff Size: Large)   Pulse 87   Ht 6\' 3"  (1.905 m)   Wt 224 lb (101.6 kg)   SpO2 96%   BMI  [name].00 kg/m   Physical Exam Constitutional:      Appearance: Normal appearance.  Eyes:     General: No scleral icterus. Cardiovascular:     Rate and Rhythm: Normal rate and regular rhythm.  Pulmonary:     Effort: Pulmonary effort is normal.     Breath sounds: Normal breath sounds.  Musculoskeletal:     Cervical back: Neck supple.  Neurological:     General: No focal deficit present.     Mental Status: [name] [name] is alert.  Psychiatric:        Mood and Affect: Mood normal.        Behavior: Behavior normal.      ------------------------------------------------------------------------------------------------------------------------------------------------------------------------------------------------------------------- Assessment and Plan  Type 2 diabetes mellitus with hyperlipidemia (HCC) Control of his blood sugars have improved.  [name] [name] does not think [name] [name] is taking glimepiride and I recommend that [name] [name] add this back on.  Continue Rybelsus and Farxiga at current strength.  [name] [name] Management of TRT by urology.  [name] at this time.   Meds ordered this encounter  Medications   Semaglutide (RYBELSUS) 7 MG TABS    Sig: Take 7 mg by mouth daily.    Dispense:  90 tablet    Refill:  3   dapagliflozin propanediol (FARXIGA) 5 MG TABS tablet    Sig: Take 1 tablet (5 mg total) by mouth daily.    Dispense:  90 tablet    Refill:  3   glimepiride (AMARYL) 2 MG tablet    Sig: Take 1 tablet (2 mg total) by mouth daily before breakfast.    Dispense:  90 tablet    Refill:  3    Return in about 4 months (around 03/05/2022) for T2DM.    This visit occurred during the SARS-CoV-2 public health emergency.  Safety protocols were in place, including screening questions prior to the visit, additional usage of staff PPE, and extensive cleaning of exam room while observing appropriate contact time as indicated for disinfecting solutions.

## 2021-11-02 NOTE — Patient Instructions (Signed)
Continue rybelsus and farxiga.  Add glimeperide back on .  You may continue berberine.   See me again in 4 months.

## 2021-11-02 NOTE — Assessment & Plan Note (Addendum)
Control of his blood sugars have improved.  He does not think he is taking glimepiride and I recommend that he add this back on.  Continue Rybelsus and Farxiga at current strength.

## 2021-11-03 ENCOUNTER — Other Ambulatory Visit: Payer: Self-pay

## 2021-11-10 ENCOUNTER — Other Ambulatory Visit: Payer: Self-pay

## 2021-11-10 MED ORDER — RYBELSUS 7 MG PO TABS: 7.0000 mg | ORAL_TABLET | Freq: Every day | ORAL | 6 refills | Status: DC

## 2021-12-07 ENCOUNTER — Ambulatory Visit: Payer: Self-pay | Admitting: Licensed Clinical Social Worker

## 2021-12-07 NOTE — Patient Instructions (Signed)
Visit Information  Thank you for taking time to visit with me today. Please don't hesitate to contact me if I can be of assistance to you before our next scheduled telephone appointment.  Following are the goals we discussed today:   No further intervention is needed at present.   Please call the care guide team at [phone] if you need to cancel or reschedule your appointment.   If you are experiencing a Mental Health or Behavioral Health Crisis or need someone to talk to, please go to [facility] [address], [place] [phone])   Following is a copy of your full plan of care:   Care Coordination Interventions:  Active listening / Reflection utilized  Informed client of Care Coordination program support Client said he was still working, still employed at present. He feels that he is basically stable at present. He was provided name of program . He was provided name and contact phone number for LCSW [phone]) Client feels at this time that he does not need program support   Mr. Gage was given information about Care Management services by the embedded care coordination team including:  Care Management services include personalized support from designated clinical staff supervised by his physician, including individualized plan of care and coordination with other care providers 24/7 contact phone numbers for assistance for urgent and routine care needs. The patient may stop CCM services at any time (effective at the end of the month) by phone call to the office staff.  Patient agreed to services and verbal consent obtained.   Kelton Pillar.Rashad Auld MSW, LCSW Licensed Visual merchandiser [facility] Care Management [phone]

## 2021-12-07 NOTE — Patient Outreach (Signed)
  Care Coordination   Initial Visit Note   12/07/2021 Name: [name] MRN: [id number] DOB: 1961-04-09  [name] is a 61 y.o. year old male who sees Everrett Coombe, DO for primary care. I spoke with  [name] by phone today  What matters to the patients health and wellness today?  Client feels he is stable. He declined program services at this time    Goals Addressed             This Visit's Progress    Patient said he was stable. Declined program support at present.       Care Coordination Interventions:  Active listening / Reflection utilized  Informed client of Care Coordination program support Client said he was still working, still employed at present. He feels that he is basically stable at present. He was provided name of program . He was provided name and contact phone number for LCSW [phone]) Client feels at this time that he does not need program support     SDOH assessments and interventions completed:  No   Care Coordination Interventions Activated:  No  Care Coordination Interventions:  No, not indicated   Follow up plan: No further intervention required.   Encounter Outcome:  Pt. Visit Completed

## 2022-01-01 DIAGNOSIS — Z125 Encounter for screening for malignant neoplasm of prostate: Secondary | ICD-10-CM | POA: Diagnosis not present

## 2022-01-01 DIAGNOSIS — E291 Testicular hypofunction: Secondary | ICD-10-CM | POA: Diagnosis not present

## 2022-01-05 DIAGNOSIS — G8929 Other chronic pain: Secondary | ICD-10-CM | POA: Diagnosis not present

## 2022-01-05 DIAGNOSIS — M1612 Unilateral primary osteoarthritis, left hip: Secondary | ICD-10-CM | POA: Diagnosis not present

## 2022-01-05 DIAGNOSIS — M25552 Pain in left hip: Secondary | ICD-10-CM | POA: Diagnosis not present

## 2022-01-08 DIAGNOSIS — G8929 Other chronic pain: Secondary | ICD-10-CM | POA: Diagnosis not present

## 2022-01-08 DIAGNOSIS — M25552 Pain in left hip: Secondary | ICD-10-CM | POA: Diagnosis not present

## 2022-01-08 DIAGNOSIS — M1612 Unilateral primary osteoarthritis, left hip: Secondary | ICD-10-CM | POA: Diagnosis not present

## 2022-01-20 DIAGNOSIS — E291 Testicular hypofunction: Secondary | ICD-10-CM | POA: Diagnosis not present

## 2022-01-20 DIAGNOSIS — D751 Secondary polycythemia: Secondary | ICD-10-CM | POA: Diagnosis not present

## 2022-03-08 ENCOUNTER — Ambulatory Visit: Payer: BC Managed Care – PPO | Admitting: Family Medicine

## 2022-04-23 DIAGNOSIS — M1612 Unilateral primary osteoarthritis, left hip: Secondary | ICD-10-CM | POA: Diagnosis not present

## 2022-05-18 LAB — HM DIABETES EYE EXAM

## 2022-05-21 ENCOUNTER — Encounter: Payer: Self-pay | Admitting: Family Medicine

## 2022-05-27 ENCOUNTER — Encounter: Payer: Self-pay | Admitting: Family Medicine

## 2022-06-04 DIAGNOSIS — J64 Unspecified pneumoconiosis: Secondary | ICD-10-CM | POA: Diagnosis not present

## 2022-06-04 DIAGNOSIS — Q984 Klinefelter syndrome, unspecified: Secondary | ICD-10-CM | POA: Diagnosis not present

## 2022-06-04 DIAGNOSIS — E785 Hyperlipidemia, unspecified: Secondary | ICD-10-CM | POA: Diagnosis not present

## 2022-06-04 DIAGNOSIS — E1169 Type 2 diabetes mellitus with other specified complication: Secondary | ICD-10-CM | POA: Diagnosis not present

## 2022-06-04 DIAGNOSIS — M25552 Pain in left hip: Secondary | ICD-10-CM | POA: Diagnosis not present

## 2022-08-02 DIAGNOSIS — R079 Chest pain, unspecified: Secondary | ICD-10-CM | POA: Diagnosis not present

## 2022-08-02 DIAGNOSIS — R0600 Dyspnea, unspecified: Secondary | ICD-10-CM | POA: Diagnosis not present

## 2022-08-02 DIAGNOSIS — E785 Hyperlipidemia, unspecified: Secondary | ICD-10-CM | POA: Diagnosis not present

## 2022-08-18 ENCOUNTER — Ambulatory Visit: Payer: BC Managed Care – PPO | Admitting: Family Medicine

## 2022-08-18 DIAGNOSIS — R079 Chest pain, unspecified: Secondary | ICD-10-CM | POA: Diagnosis not present

## 2022-08-18 DIAGNOSIS — E785 Hyperlipidemia, unspecified: Secondary | ICD-10-CM | POA: Diagnosis not present

## 2022-08-18 DIAGNOSIS — R0600 Dyspnea, unspecified: Secondary | ICD-10-CM | POA: Diagnosis not present

## 2022-09-22 ENCOUNTER — Ambulatory Visit: Payer: BC Managed Care – PPO | Admitting: Family Medicine

## 2022-09-24 ENCOUNTER — Ambulatory Visit: Payer: BC Managed Care – PPO | Admitting: Family Medicine

## 2022-09-24 ENCOUNTER — Encounter: Payer: Self-pay | Admitting: Family Medicine

## 2022-09-24 VITALS — BP 131/83 | HR 88 | Ht 75.0 in | Wt 223.0 lb

## 2022-09-24 DIAGNOSIS — E782 Mixed hyperlipidemia: Secondary | ICD-10-CM | POA: Diagnosis not present

## 2022-09-24 DIAGNOSIS — E785 Hyperlipidemia, unspecified: Secondary | ICD-10-CM

## 2022-09-24 DIAGNOSIS — Z7984 Long term (current) use of oral hypoglycemic drugs: Secondary | ICD-10-CM

## 2022-09-24 DIAGNOSIS — M79672 Pain in left foot: Secondary | ICD-10-CM

## 2022-09-24 DIAGNOSIS — E1169 Type 2 diabetes mellitus with other specified complication: Secondary | ICD-10-CM | POA: Diagnosis not present

## 2022-09-24 DIAGNOSIS — Z789 Other specified health status: Secondary | ICD-10-CM

## 2022-09-24 DIAGNOSIS — M79671 Pain in right foot: Secondary | ICD-10-CM

## 2022-09-24 DIAGNOSIS — M21619 Bunion of unspecified foot: Secondary | ICD-10-CM

## 2022-09-24 LAB — CBC WITH DIFFERENTIAL/PLATELET
Basophils Absolute: 39 cells/uL (ref 0–200)
Eosinophils Relative: 1.1 %
MCH: 31 pg (ref 27.0–33.0)
MPV: 11.2 fL (ref 7.5–12.5)
Neutrophils Relative %: 62 %
RDW: 12.4 % (ref 11.0–15.0)
Total Lymphocyte: 30.4 %

## 2022-09-24 LAB — POCT GLYCOSYLATED HEMOGLOBIN (HGB A1C): HbA1c, POC (controlled diabetic range): 6.8 % (ref 0.0–7.0)

## 2022-09-24 MED ORDER — RYBELSUS 14 MG PO TABS
14.0000 mg | ORAL_TABLET | Freq: Every day | ORAL | 1 refills | Status: DC
Start: 2022-09-24 — End: 2023-03-31

## 2022-09-24 NOTE — Patient Instructions (Addendum)
Increase Rybelsus to 14mg  daily.  Add fiber supplement or miralax to help with constipation. Stay well hydrated.  Continue glimepiride and farxiga See me again in 4 months.

## 2022-09-24 NOTE — Progress Notes (Signed)
[name] - 62 y.o. male MRN [id number]  Date of birth: [birth year]  Subjective Chief Complaint  Patient presents with   Diabetes    HPI [name] is a 62 y.o. male here today for follow up .   He is planning on having hip surgery on the but was told that his glucose had to be under better control before he could have surgery.  Will start on Repatha at previous visit.  He is tolerating this well at current strength and 7 mg daily.  His A1c has improved to 6.8%.  He is little more active.  He has not made any significant changes to his diet other than reduction in portion.  Diabetes is currently managed with combination of glimeperide, farxiga, metformin and Rybelsus. He is tolerating these pretty well.  He is not monitoring blood glucose at home.  No longer taking crestor for management of lipids due to myalgias.  He is now taking repatha.  This is being managed by cardiology.    He does complain of pain in both of the feet.  Pain located along the forefoot bilaterally.  He does have some sore callused areas.  ROS:  A comprehensive ROS was completed and negative except as noted per HPI  Allergies  Allergen Reactions   Rosuvastatin Anxiety    Made him feel jittery   Latex Rash    Past Medical History:  Diagnosis Date   Allergy    Cervical radiculopathy 08/09/2017   Diabetes mellitus without complication (HCC)    Stroke (cerebrum) (HCC) 08/03/2017   Type 2 diabetes mellitus with hyperlipidemia (HCC)     Past Surgical History:  Procedure Laterality Date   ESOPHAGOGASTRODUODENOSCOPY (EGD) WITH PROPOFOL N/A 05/09/2015   Procedure: ESOPHAGOGASTRODUODENOSCOPY (EGD) WITH PROPOFOL;  Surgeon: Iva Boop, MD;  Location: [facility];  Service: Endoscopy;  Laterality: N/A;  with esophageal dilation   LASER ABLATION Left 02/03/15   VARICOSE VEIN SURGERY      Social History   Socioeconomic History   Marital status: Married    Spouse name: Not on file   Number of  children: Not on file   Years of education: Not on file   Highest education level: Not on file  Occupational History   Not on file  Tobacco Use   Smoking status: Never   Smokeless tobacco: Never  Vaping Use   Vaping Use: Never used  Substance and Sexual Activity   Alcohol use: No    Alcohol/week: 0.0 standard drinks of alcohol   Drug use: No   Sexual activity: Never  Other Topics Concern   Not on file  Social History Narrative   Not on file   Social Determinants of Health   Financial Resource Strain: Not on file  Food Insecurity: Not on file  Transportation Needs: Not on file  Physical Activity: Not on file  Stress: Not on file  Social Connections: Not on file    Family History  Problem Relation Age of Onset   Parkinson's disease Father    Heart attack Maternal Grandmother    Diabetes Neg Hx     Health Maintenance  Topic Date Due   FOOT EXAM  01/29/2022   Colonoscopy  11/03/2022 (Originally 01/14/2004)   Hepatitis C Screening  11/03/2022 (Originally 01/13/1977)   Zoster Vaccines- Shingrix (1 of 2) 12/25/2022 (Originally 01/13/2009)   HEMOGLOBIN A1C  03/26/2023   OPHTHALMOLOGY EXAM  05/19/2023   Diabetic kidney evaluation - eGFR measurement  09/24/2023  Diabetic kidney evaluation - Urine ACR  09/24/2023   DTaP/Tdap/Td (2 - Td or Tdap) 01/30/2031   HIV Screening  Completed   HPV VACCINES  Aged Out   INFLUENZA VACCINE  Discontinued   COVID-19 Vaccine  Discontinued     ----------------------------------------------------------------------------------------------------------------------------------------------------------------------------------------------------------------- Physical Exam BP 131/83 (BP Location: Left Arm, Patient Position: Sitting, Cuff Size: Normal)   Pulse 88   Ht 6\' 3"  (1.905 m)   Wt 223 lb (101.2 kg)   SpO2 95%   BMI 27.87 kg/m   Physical Exam Constitutional:      Appearance: Normal appearance.  HENT:     Head: Normocephalic and  atraumatic.  Musculoskeletal:     Comments: Bunion of feet noted bilaterally.  He does have some callus formation over the metatarsal heads which is somewhat tender.  Neurological:     Mental Status: He is alert.  Psychiatric:        Mood and Affect: Mood normal.        Behavior: Behavior normal.     ------------------------------------------------------------------------------------------------------------------------------------------------------------------------------------------------------------------- Assessment and Plan  Type 2 diabetes mellitus with hyperlipidemia (HCC) Diabetes control is improved.  He is tolerating Rybelsus well and will increase further to 14 mg daily.  Continue Farxiga and Amaryl at current strength.  Hyperlipidemia He did not tolerate statin well.  Currently prescribed Repatha but has not started yet.  Encouraged him to go ahead and start this.   Meds ordered this encounter  Medications   Semaglutide (RYBELSUS) 14 MG TABS    Sig: Take 1 tablet (14 mg total) by mouth daily.    Dispense:  90 tablet    Refill:  1    Return in about 4 months (around 01/24/2023).    This visit occurred during the SARS-CoV-2 public health emergency.  Safety protocols were in place, including screening questions prior to the visit, additional usage of staff PPE, and extensive cleaning of exam room while observing appropriate contact time as indicated for disinfecting solutions.

## 2022-09-25 LAB — CBC WITH DIFFERENTIAL/PLATELET
Absolute Monocytes: 384 cells/uL (ref 200–950)
Basophils Relative: 0.6 %
Eosinophils Absolute: 72 cells/uL (ref 15–500)
HCT: 61.3 % — ABNORMAL HIGH (ref 38.5–50.0)
Hemoglobin: 20.6 g/dL — ABNORMAL HIGH (ref 13.2–17.1)
Lymphs Abs: 1976 cells/uL (ref 850–3900)
MCHC: 33.6 g/dL (ref 32.0–36.0)
MCV: 92.3 fL (ref 80.0–100.0)
Monocytes Relative: 5.9 %
Neutro Abs: 4030 cells/uL (ref 1500–7800)
Platelets: 150 10*3/uL (ref 140–400)
RBC: 6.64 10*6/uL — ABNORMAL HIGH (ref 4.20–5.80)
WBC: 6.5 10*3/uL (ref 3.8–10.8)

## 2022-09-25 LAB — COMPLETE METABOLIC PANEL WITH GFR
AG Ratio: 1.5 (calc) (ref 1.0–2.5)
ALT: 16 U/L (ref 9–46)
AST: 16 U/L (ref 10–35)
Albumin: 4.3 g/dL (ref 3.6–5.1)
Alkaline phosphatase (APISO): 87 U/L (ref 35–144)
BUN: 18 mg/dL (ref 7–25)
CO2: 25 mmol/L (ref 20–32)
Calcium: 9.3 mg/dL (ref 8.6–10.3)
Chloride: 105 mmol/L (ref 98–110)
Creat: 1 mg/dL (ref 0.70–1.35)
Globulin: 2.8 g/dL (calc) (ref 1.9–3.7)
Glucose, Bld: 117 mg/dL — ABNORMAL HIGH (ref 65–99)
Potassium: 4 mmol/L (ref 3.5–5.3)
Sodium: 139 mmol/L (ref 135–146)
Total Bilirubin: 0.7 mg/dL (ref 0.2–1.2)
Total Protein: 7.1 g/dL (ref 6.1–8.1)
eGFR: 85 mL/min/{1.73_m2} (ref >=60)

## 2022-09-25 LAB — LIPID PANEL W/REFLEX DIRECT LDL
Cholesterol: 202 mg/dL — ABNORMAL HIGH (ref <200)
HDL: 42 mg/dL (ref >=40)
LDL Cholesterol (Calc): 130 mg/dL (calc) — ABNORMAL HIGH
Non-HDL Cholesterol (Calc): 160 mg/dL (calc) — ABNORMAL HIGH (ref <130)
Total CHOL/HDL Ratio: 4.8 (calc) (ref <5.0)
Triglycerides: 164 mg/dL — ABNORMAL HIGH (ref <150)

## 2022-09-25 LAB — MICROALBUMIN / CREATININE URINE RATIO
Creatinine, Urine: 85 mg/dL (ref 20–320)
Microalb Creat Ratio: 447 mg/g creat — ABNORMAL HIGH (ref <30)
Microalb, Ur: 38 mg/dL

## 2022-09-26 DIAGNOSIS — Z789 Other specified health status: Secondary | ICD-10-CM | POA: Insufficient documentation

## 2022-09-26 NOTE — Assessment & Plan Note (Signed)
Diabetes control is improved.  He is tolerating Rybelsus well and will increase further to 14 mg daily.  Continue Farxiga and Amaryl at current strength.

## 2022-09-26 NOTE — Assessment & Plan Note (Signed)
He did not tolerate statin well.  Currently prescribed Repatha but has not started yet.  Encouraged him to go ahead and start this.

## 2022-09-27 DIAGNOSIS — M1612 Unilateral primary osteoarthritis, left hip: Secondary | ICD-10-CM | POA: Insufficient documentation

## 2022-10-15 ENCOUNTER — Ambulatory Visit (INDEPENDENT_AMBULATORY_CARE_PROVIDER_SITE_OTHER): Payer: BC Managed Care – PPO

## 2022-10-15 ENCOUNTER — Ambulatory Visit: Payer: BC Managed Care – PPO | Admitting: Podiatry

## 2022-10-15 DIAGNOSIS — M2012 Hallux valgus (acquired), left foot: Secondary | ICD-10-CM | POA: Diagnosis not present

## 2022-10-15 DIAGNOSIS — E1169 Type 2 diabetes mellitus with other specified complication: Secondary | ICD-10-CM

## 2022-10-15 DIAGNOSIS — M21612 Bunion of left foot: Secondary | ICD-10-CM

## 2022-10-15 DIAGNOSIS — M2011 Hallux valgus (acquired), right foot: Secondary | ICD-10-CM | POA: Diagnosis not present

## 2022-10-15 DIAGNOSIS — M7731 Calcaneal spur, right foot: Secondary | ICD-10-CM | POA: Diagnosis not present

## 2022-10-15 DIAGNOSIS — M21611 Bunion of right foot: Secondary | ICD-10-CM | POA: Diagnosis not present

## 2022-10-15 DIAGNOSIS — E785 Hyperlipidemia, unspecified: Secondary | ICD-10-CM

## 2022-10-15 DIAGNOSIS — G63 Polyneuropathy in diseases classified elsewhere: Secondary | ICD-10-CM

## 2022-10-15 DIAGNOSIS — M79671 Pain in right foot: Secondary | ICD-10-CM | POA: Diagnosis not present

## 2022-10-15 DIAGNOSIS — L84 Corns and callosities: Secondary | ICD-10-CM | POA: Diagnosis not present

## 2022-10-15 DIAGNOSIS — L603 Nail dystrophy: Secondary | ICD-10-CM | POA: Diagnosis not present

## 2022-10-15 DIAGNOSIS — M7989 Other specified soft tissue disorders: Secondary | ICD-10-CM | POA: Diagnosis not present

## 2022-10-15 DIAGNOSIS — M19072 Primary osteoarthritis, left ankle and foot: Secondary | ICD-10-CM | POA: Diagnosis not present

## 2022-10-15 DIAGNOSIS — B351 Tinea unguium: Secondary | ICD-10-CM | POA: Diagnosis not present

## 2022-10-15 DIAGNOSIS — M7732 Calcaneal spur, left foot: Secondary | ICD-10-CM | POA: Diagnosis not present

## 2022-10-15 NOTE — Progress Notes (Signed)
  Subjective:  Patient ID: [name], male    DOB: [birth year],   MRN: [id number]  Chief Complaint  Patient presents with   Callouses    Bilateral callus trim    Foot Problem    Bilateral feet going outward     62 y.o. male presents for concern of thickened callus that are painful on bottom of foot. Requesting to have them trimmed today. Relates burning and tingling in their feet. Patient is diabetic and last A1c was  Lab Results  Component Value Date   HGBA1C 6.8 09/24/2022   .   PCP:  Everrett Coombe, DO    . Denies any other pedal complaints. Denies n/v/f/c.   Past Medical History:  Diagnosis Date   Allergy    Cervical radiculopathy 08/09/2017   Diabetes mellitus without complication (HCC)    Stroke (cerebrum) (HCC) 08/03/2017   Type 2 diabetes mellitus with hyperlipidemia (HCC)     Objective:  Physical Exam: Vascular: DP/PT pulses 2/4 bilateral. CFT <3 seconds. Normal hair growth on digits. No edema.  Skin. No lacerations or abrasions bilateral feet. Hyperkeratotic tissue noted sub first metatarsal head and sub second and third metatarsal heads bilateral. Left digit nails 1-5 are thickened discolored and elongated.  Musculoskeletal: MMT 5/5 bilateral lower extremities in DF, PF, Inversion and Eversion. Deceased ROM in DF of ankle joint.  Neurological: Sensation intact to light touch.   Assessment:   1. Callus of foot   2. Type 2 diabetes mellitus with hyperlipidemia (HCC)   3. Polyneuropathy associated with underlying disease (HCC)   4. Bilateral bunions      Plan:  Patient was evaluated and treated and all questions answered. -Discussed and educated patient on diabetic foot care, especially with  regards to the vascular, neurological and musculoskeletal systems.  -Stressed the importance of good glycemic control and the detriment of not  controlling glucose levels in relation to the foot. -Discussed supportive shoes at all times and checking feet regularly.   -Mechanically debrided hyperkeratotic tissue with chisel without incident.  -Answered all patient questions -Appointment for DM shoes.  Did take sample of left great toenail to evaluate for any fungus.  -Patient to return  in 3 months for at risk foot care -Patient advised to call the office if any problems or questions arise in the meantime.   Louann Sjogren, DPM

## 2022-10-29 DIAGNOSIS — G8929 Other chronic pain: Secondary | ICD-10-CM | POA: Diagnosis not present

## 2022-10-29 DIAGNOSIS — M25552 Pain in left hip: Secondary | ICD-10-CM | POA: Diagnosis not present

## 2022-11-05 ENCOUNTER — Other Ambulatory Visit: Payer: Self-pay

## 2022-11-05 DIAGNOSIS — E1169 Type 2 diabetes mellitus with other specified complication: Secondary | ICD-10-CM

## 2022-11-05 MED ORDER — GLIMEPIRIDE 2 MG PO TABS
2.0000 mg | ORAL_TABLET | Freq: Every day | ORAL | 1 refills | Status: DC
Start: 2022-11-05 — End: 2023-05-18

## 2022-11-10 ENCOUNTER — Encounter: Payer: Self-pay | Admitting: Family Medicine

## 2022-11-10 ENCOUNTER — Encounter: Payer: Self-pay | Admitting: Podiatry

## 2022-11-10 DIAGNOSIS — Z01812 Encounter for preprocedural laboratory examination: Secondary | ICD-10-CM | POA: Diagnosis not present

## 2022-11-10 DIAGNOSIS — E785 Hyperlipidemia, unspecified: Secondary | ICD-10-CM | POA: Diagnosis not present

## 2022-11-10 DIAGNOSIS — Z01818 Encounter for other preprocedural examination: Secondary | ICD-10-CM | POA: Diagnosis not present

## 2022-11-10 NOTE — Telephone Encounter (Signed)
This was noted on his last set of labs with [place] in June as well.  Result note sent recommending that he follow up with Dr. Mena Goes as this is likely coming from his testosterone injections.  Doesn't need a blood specialist until after he discusses this with Dr. Mena Goes.   CM

## 2022-11-11 NOTE — Telephone Encounter (Signed)
Patient informed and will call Dr. Mena Goes to schedule a follow up to discuss elevated hgb levels.   He states that Dr. Jeanella Craze had cancelled his hip surgery that was scheduled for Aug 13th and it has not yet been rescheduled.  States his A1C is 6.8

## 2022-11-25 ENCOUNTER — Telehealth: Payer: Self-pay | Admitting: Family Medicine

## 2022-11-25 ENCOUNTER — Other Ambulatory Visit: Payer: Self-pay | Admitting: Family Medicine

## 2022-11-25 MED ORDER — DAPAGLIFLOZIN PROPANEDIOL 5 MG PO TABS: 5.0000 mg | ORAL_TABLET | Freq: Every day | ORAL | 3 refills | Status: DC

## 2022-11-25 NOTE — Telephone Encounter (Signed)
Patient called in needing a refill and PA for Farxiga 5mg . States he called previously regarding the same thing.  [facility] [phone]

## 2022-11-25 NOTE — Telephone Encounter (Signed)
Updated rx sent in. Please complete PA.  Thanks!  CM

## 2022-11-26 ENCOUNTER — Telehealth: Payer: Self-pay

## 2022-11-26 DIAGNOSIS — E349 Endocrine disorder, unspecified: Secondary | ICD-10-CM | POA: Diagnosis not present

## 2022-11-26 DIAGNOSIS — D751 Secondary polycythemia: Secondary | ICD-10-CM | POA: Diagnosis not present

## 2022-11-26 DIAGNOSIS — D582 Other hemoglobinopathies: Secondary | ICD-10-CM | POA: Diagnosis not present

## 2022-11-26 NOTE — Telephone Encounter (Signed)
Initiated Prior authorization [id number] 5MG  tablets Via: Covermymeds Case/Key:[id number] Status: Pending as of 11/26/22 Reason: Notified Pt via: Mychart

## 2022-12-26 ENCOUNTER — Encounter: Payer: Self-pay | Admitting: Podiatry

## 2022-12-31 DIAGNOSIS — D751 Secondary polycythemia: Secondary | ICD-10-CM | POA: Diagnosis not present

## 2022-12-31 DIAGNOSIS — E349 Endocrine disorder, unspecified: Secondary | ICD-10-CM | POA: Diagnosis not present

## 2023-01-10 ENCOUNTER — Encounter: Payer: Self-pay | Admitting: Podiatry

## 2023-01-13 ENCOUNTER — Ambulatory Visit (INDEPENDENT_AMBULATORY_CARE_PROVIDER_SITE_OTHER): Payer: BC Managed Care – PPO | Admitting: Podiatry

## 2023-01-13 DIAGNOSIS — Z91199 Patient's noncompliance with other medical treatment and regimen due to unspecified reason: Secondary | ICD-10-CM

## 2023-01-13 NOTE — Progress Notes (Signed)
No show

## 2023-01-24 ENCOUNTER — Ambulatory Visit: Payer: BC Managed Care – PPO | Admitting: Family Medicine

## 2023-01-24 ENCOUNTER — Encounter: Payer: Self-pay | Admitting: Family Medicine

## 2023-01-24 VITALS — BP 126/70 | HR 99 | Ht 75.0 in | Wt 213.0 lb

## 2023-01-24 DIAGNOSIS — E291 Testicular hypofunction: Secondary | ICD-10-CM | POA: Diagnosis not present

## 2023-01-24 DIAGNOSIS — M1612 Unilateral primary osteoarthritis, left hip: Secondary | ICD-10-CM

## 2023-01-24 DIAGNOSIS — E1169 Type 2 diabetes mellitus with other specified complication: Secondary | ICD-10-CM | POA: Diagnosis not present

## 2023-01-24 DIAGNOSIS — E785 Hyperlipidemia, unspecified: Secondary | ICD-10-CM | POA: Diagnosis not present

## 2023-01-24 DIAGNOSIS — Z125 Encounter for screening for malignant neoplasm of prostate: Secondary | ICD-10-CM | POA: Diagnosis not present

## 2023-01-24 DIAGNOSIS — E782 Mixed hyperlipidemia: Secondary | ICD-10-CM

## 2023-01-24 DIAGNOSIS — Z7984 Long term (current) use of oral hypoglycemic drugs: Secondary | ICD-10-CM

## 2023-01-24 LAB — POCT GLYCOSYLATED HEMOGLOBIN (HGB A1C): HbA1c, POC (controlled diabetic range): 5.9 % (ref 0.0–7.0)

## 2023-01-24 NOTE — Assessment & Plan Note (Signed)
He did not tolerate statin well.  Currently prescribed Repatha but has not started yet.  Encouraged him to go ahead and start this.
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
## 2023-01-24 NOTE — Assessment & Plan Note (Signed)
Has upcoming THA in December.  As long as his dental infection is cleared up I think he should be able to proceed with surgery.

## 2023-01-24 NOTE — Assessment & Plan Note (Signed)
Diabetes is well controlled.  Continue current medications for management of diabetes.

## 2023-01-24 NOTE — Assessment & Plan Note (Signed)
Follows with urology at [facility].  Remains on topical testosterone.

## 2023-03-01 DIAGNOSIS — I10 Essential (primary) hypertension: Secondary | ICD-10-CM | POA: Diagnosis not present

## 2023-03-01 DIAGNOSIS — Z01812 Encounter for preprocedural laboratory examination: Secondary | ICD-10-CM | POA: Diagnosis not present

## 2023-03-01 DIAGNOSIS — E291 Testicular hypofunction: Secondary | ICD-10-CM | POA: Diagnosis not present

## 2023-03-01 DIAGNOSIS — E119 Type 2 diabetes mellitus without complications: Secondary | ICD-10-CM | POA: Diagnosis not present

## 2023-03-01 DIAGNOSIS — E785 Hyperlipidemia, unspecified: Secondary | ICD-10-CM | POA: Diagnosis not present

## 2023-03-01 DIAGNOSIS — Z01818 Encounter for other preprocedural examination: Secondary | ICD-10-CM | POA: Diagnosis not present

## 2023-03-01 DIAGNOSIS — Z01810 Encounter for preprocedural cardiovascular examination: Secondary | ICD-10-CM | POA: Diagnosis not present

## 2023-03-01 DIAGNOSIS — D751 Secondary polycythemia: Secondary | ICD-10-CM | POA: Diagnosis not present

## 2023-03-01 DIAGNOSIS — M5412 Radiculopathy, cervical region: Secondary | ICD-10-CM | POA: Diagnosis not present

## 2023-03-14 DIAGNOSIS — E291 Testicular hypofunction: Secondary | ICD-10-CM | POA: Diagnosis not present

## 2023-03-15 ENCOUNTER — Other Ambulatory Visit: Payer: Self-pay | Admitting: Family Medicine

## 2023-03-15 ENCOUNTER — Telehealth: Payer: Self-pay

## 2023-03-15 MED ORDER — MELOXICAM 15 MG PO TABS: 15.0000 mg | ORAL_TABLET | Freq: Every day | ORAL | 1 refills | Status: DC | PRN

## 2023-03-15 NOTE — Telephone Encounter (Signed)
Copied from CRM [id number]. Topic: Clinical - Medical Advice >> Mar 15, 2023  9:07 AM Mosetta Putt H wrote: Reason for CRM: Wondering if he is able to get a new arthritis medication Meloxitam

## 2023-03-22 DIAGNOSIS — Z7984 Long term (current) use of oral hypoglycemic drugs: Secondary | ICD-10-CM | POA: Diagnosis not present

## 2023-03-22 DIAGNOSIS — Z8673 Personal history of transient ischemic attack (TIA), and cerebral infarction without residual deficits: Secondary | ICD-10-CM | POA: Diagnosis not present

## 2023-03-22 DIAGNOSIS — I10 Essential (primary) hypertension: Secondary | ICD-10-CM | POA: Diagnosis not present

## 2023-03-22 DIAGNOSIS — G8929 Other chronic pain: Secondary | ICD-10-CM | POA: Diagnosis not present

## 2023-03-22 DIAGNOSIS — E785 Hyperlipidemia, unspecified: Secondary | ICD-10-CM | POA: Diagnosis not present

## 2023-03-22 DIAGNOSIS — M25552 Pain in left hip: Secondary | ICD-10-CM | POA: Diagnosis not present

## 2023-03-22 DIAGNOSIS — Z888 Allergy status to other drugs, medicaments and biological substances status: Secondary | ICD-10-CM | POA: Diagnosis not present

## 2023-03-22 DIAGNOSIS — Z7982 Long term (current) use of aspirin: Secondary | ICD-10-CM | POA: Diagnosis not present

## 2023-03-22 DIAGNOSIS — M247 Protrusio acetabuli: Secondary | ICD-10-CM | POA: Diagnosis not present

## 2023-03-22 DIAGNOSIS — Z96642 Presence of left artificial hip joint: Secondary | ICD-10-CM | POA: Insufficient documentation

## 2023-03-22 DIAGNOSIS — Z91040 Latex allergy status: Secondary | ICD-10-CM | POA: Diagnosis not present

## 2023-03-22 DIAGNOSIS — E119 Type 2 diabetes mellitus without complications: Secondary | ICD-10-CM | POA: Diagnosis not present

## 2023-03-22 DIAGNOSIS — M1612 Unilateral primary osteoarthritis, left hip: Secondary | ICD-10-CM | POA: Diagnosis not present

## 2023-03-22 DIAGNOSIS — M6281 Muscle weakness (generalized): Secondary | ICD-10-CM | POA: Diagnosis not present

## 2023-03-22 DIAGNOSIS — Z96649 Presence of unspecified artificial hip joint: Secondary | ICD-10-CM

## 2023-03-22 DIAGNOSIS — R2681 Unsteadiness on feet: Secondary | ICD-10-CM | POA: Diagnosis not present

## 2023-03-22 DIAGNOSIS — R2689 Other abnormalities of gait and mobility: Secondary | ICD-10-CM | POA: Diagnosis not present

## 2023-03-22 DIAGNOSIS — E1169 Type 2 diabetes mellitus with other specified complication: Secondary | ICD-10-CM | POA: Diagnosis not present

## 2023-03-22 DIAGNOSIS — Z471 Aftercare following joint replacement surgery: Secondary | ICD-10-CM | POA: Diagnosis not present

## 2023-03-22 HISTORY — DX: Presence of unspecified artificial hip joint: Z96.649

## 2023-03-23 DIAGNOSIS — Z8673 Personal history of transient ischemic attack (TIA), and cerebral infarction without residual deficits: Secondary | ICD-10-CM | POA: Diagnosis not present

## 2023-03-23 DIAGNOSIS — M25552 Pain in left hip: Secondary | ICD-10-CM | POA: Diagnosis not present

## 2023-03-23 DIAGNOSIS — M1612 Unilateral primary osteoarthritis, left hip: Secondary | ICD-10-CM | POA: Diagnosis not present

## 2023-03-23 DIAGNOSIS — E785 Hyperlipidemia, unspecified: Secondary | ICD-10-CM | POA: Diagnosis not present

## 2023-03-23 DIAGNOSIS — Z888 Allergy status to other drugs, medicaments and biological substances status: Secondary | ICD-10-CM | POA: Diagnosis not present

## 2023-03-23 DIAGNOSIS — I10 Essential (primary) hypertension: Secondary | ICD-10-CM | POA: Diagnosis not present

## 2023-03-23 DIAGNOSIS — R2689 Other abnormalities of gait and mobility: Secondary | ICD-10-CM | POA: Diagnosis not present

## 2023-03-23 DIAGNOSIS — M247 Protrusio acetabuli: Secondary | ICD-10-CM | POA: Diagnosis not present

## 2023-03-23 DIAGNOSIS — Z7984 Long term (current) use of oral hypoglycemic drugs: Secondary | ICD-10-CM | POA: Diagnosis not present

## 2023-03-23 DIAGNOSIS — Z91040 Latex allergy status: Secondary | ICD-10-CM | POA: Diagnosis not present

## 2023-03-23 DIAGNOSIS — E1169 Type 2 diabetes mellitus with other specified complication: Secondary | ICD-10-CM | POA: Diagnosis not present

## 2023-03-23 DIAGNOSIS — M6281 Muscle weakness (generalized): Secondary | ICD-10-CM | POA: Diagnosis not present

## 2023-03-23 DIAGNOSIS — Z7982 Long term (current) use of aspirin: Secondary | ICD-10-CM | POA: Diagnosis not present

## 2023-03-23 DIAGNOSIS — Z96642 Presence of left artificial hip joint: Secondary | ICD-10-CM | POA: Diagnosis not present

## 2023-03-23 DIAGNOSIS — R2681 Unsteadiness on feet: Secondary | ICD-10-CM | POA: Diagnosis not present

## 2023-03-24 DIAGNOSIS — Z888 Allergy status to other drugs, medicaments and biological substances status: Secondary | ICD-10-CM | POA: Diagnosis not present

## 2023-03-24 DIAGNOSIS — E1169 Type 2 diabetes mellitus with other specified complication: Secondary | ICD-10-CM | POA: Diagnosis not present

## 2023-03-24 DIAGNOSIS — R2681 Unsteadiness on feet: Secondary | ICD-10-CM | POA: Diagnosis not present

## 2023-03-24 DIAGNOSIS — Z7982 Long term (current) use of aspirin: Secondary | ICD-10-CM | POA: Diagnosis not present

## 2023-03-24 DIAGNOSIS — Z91040 Latex allergy status: Secondary | ICD-10-CM | POA: Diagnosis not present

## 2023-03-24 DIAGNOSIS — Z8673 Personal history of transient ischemic attack (TIA), and cerebral infarction without residual deficits: Secondary | ICD-10-CM | POA: Diagnosis not present

## 2023-03-24 DIAGNOSIS — Z96642 Presence of left artificial hip joint: Secondary | ICD-10-CM | POA: Diagnosis not present

## 2023-03-24 DIAGNOSIS — M6281 Muscle weakness (generalized): Secondary | ICD-10-CM | POA: Diagnosis not present

## 2023-03-24 DIAGNOSIS — R2689 Other abnormalities of gait and mobility: Secondary | ICD-10-CM | POA: Diagnosis not present

## 2023-03-24 DIAGNOSIS — M247 Protrusio acetabuli: Secondary | ICD-10-CM | POA: Diagnosis not present

## 2023-03-24 DIAGNOSIS — M1612 Unilateral primary osteoarthritis, left hip: Secondary | ICD-10-CM | POA: Diagnosis not present

## 2023-03-24 DIAGNOSIS — E785 Hyperlipidemia, unspecified: Secondary | ICD-10-CM | POA: Diagnosis not present

## 2023-03-24 DIAGNOSIS — M25552 Pain in left hip: Secondary | ICD-10-CM | POA: Diagnosis not present

## 2023-03-24 DIAGNOSIS — Z7984 Long term (current) use of oral hypoglycemic drugs: Secondary | ICD-10-CM | POA: Diagnosis not present

## 2023-03-24 DIAGNOSIS — I10 Essential (primary) hypertension: Secondary | ICD-10-CM | POA: Diagnosis not present

## 2023-03-25 DIAGNOSIS — Z91040 Latex allergy status: Secondary | ICD-10-CM | POA: Diagnosis not present

## 2023-03-25 DIAGNOSIS — M25552 Pain in left hip: Secondary | ICD-10-CM | POA: Diagnosis not present

## 2023-03-25 DIAGNOSIS — Z7984 Long term (current) use of oral hypoglycemic drugs: Secondary | ICD-10-CM | POA: Diagnosis not present

## 2023-03-25 DIAGNOSIS — Z8673 Personal history of transient ischemic attack (TIA), and cerebral infarction without residual deficits: Secondary | ICD-10-CM | POA: Diagnosis not present

## 2023-03-25 DIAGNOSIS — E1169 Type 2 diabetes mellitus with other specified complication: Secondary | ICD-10-CM | POA: Diagnosis not present

## 2023-03-25 DIAGNOSIS — R2681 Unsteadiness on feet: Secondary | ICD-10-CM | POA: Diagnosis not present

## 2023-03-25 DIAGNOSIS — M6281 Muscle weakness (generalized): Secondary | ICD-10-CM | POA: Diagnosis not present

## 2023-03-25 DIAGNOSIS — Z96642 Presence of left artificial hip joint: Secondary | ICD-10-CM | POA: Diagnosis not present

## 2023-03-25 DIAGNOSIS — R2689 Other abnormalities of gait and mobility: Secondary | ICD-10-CM | POA: Diagnosis not present

## 2023-03-25 DIAGNOSIS — E785 Hyperlipidemia, unspecified: Secondary | ICD-10-CM | POA: Diagnosis not present

## 2023-03-25 DIAGNOSIS — M247 Protrusio acetabuli: Secondary | ICD-10-CM | POA: Diagnosis not present

## 2023-03-25 DIAGNOSIS — Z888 Allergy status to other drugs, medicaments and biological substances status: Secondary | ICD-10-CM | POA: Diagnosis not present

## 2023-03-25 DIAGNOSIS — M1612 Unilateral primary osteoarthritis, left hip: Secondary | ICD-10-CM | POA: Diagnosis not present

## 2023-03-25 DIAGNOSIS — I10 Essential (primary) hypertension: Secondary | ICD-10-CM | POA: Diagnosis not present

## 2023-03-25 DIAGNOSIS — Z7982 Long term (current) use of aspirin: Secondary | ICD-10-CM | POA: Diagnosis not present

## 2023-03-26 DIAGNOSIS — M25352 Other instability, left hip: Secondary | ICD-10-CM | POA: Diagnosis not present

## 2023-03-29 ENCOUNTER — Other Ambulatory Visit: Payer: Self-pay | Admitting: Family Medicine

## 2023-03-29 DIAGNOSIS — E1169 Type 2 diabetes mellitus with other specified complication: Secondary | ICD-10-CM | POA: Diagnosis not present

## 2023-03-29 DIAGNOSIS — E7800 Pure hypercholesterolemia, unspecified: Secondary | ICD-10-CM | POA: Diagnosis not present

## 2023-03-29 DIAGNOSIS — Z981 Arthrodesis status: Secondary | ICD-10-CM | POA: Diagnosis not present

## 2023-03-29 DIAGNOSIS — Z791 Long term (current) use of non-steroidal anti-inflammatories (NSAID): Secondary | ICD-10-CM | POA: Diagnosis not present

## 2023-03-29 DIAGNOSIS — M5412 Radiculopathy, cervical region: Secondary | ICD-10-CM | POA: Diagnosis not present

## 2023-03-29 DIAGNOSIS — I10 Essential (primary) hypertension: Secondary | ICD-10-CM | POA: Diagnosis not present

## 2023-03-29 DIAGNOSIS — D751 Secondary polycythemia: Secondary | ICD-10-CM | POA: Diagnosis not present

## 2023-03-29 DIAGNOSIS — Z8673 Personal history of transient ischemic attack (TIA), and cerebral infarction without residual deficits: Secondary | ICD-10-CM | POA: Diagnosis not present

## 2023-03-29 DIAGNOSIS — Q984 Klinefelter syndrome, unspecified: Secondary | ICD-10-CM | POA: Diagnosis not present

## 2023-03-29 DIAGNOSIS — E291 Testicular hypofunction: Secondary | ICD-10-CM | POA: Diagnosis not present

## 2023-03-29 DIAGNOSIS — K5900 Constipation, unspecified: Secondary | ICD-10-CM | POA: Diagnosis not present

## 2023-03-29 DIAGNOSIS — Z96642 Presence of left artificial hip joint: Secondary | ICD-10-CM | POA: Diagnosis not present

## 2023-03-29 DIAGNOSIS — Z7901 Long term (current) use of anticoagulants: Secondary | ICD-10-CM | POA: Diagnosis not present

## 2023-03-29 DIAGNOSIS — Z79891 Long term (current) use of opiate analgesic: Secondary | ICD-10-CM | POA: Diagnosis not present

## 2023-03-29 DIAGNOSIS — Z7984 Long term (current) use of oral hypoglycemic drugs: Secondary | ICD-10-CM | POA: Diagnosis not present

## 2023-03-29 DIAGNOSIS — Z471 Aftercare following joint replacement surgery: Secondary | ICD-10-CM | POA: Diagnosis not present

## 2023-03-30 DIAGNOSIS — M1612 Unilateral primary osteoarthritis, left hip: Secondary | ICD-10-CM | POA: Diagnosis not present

## 2023-03-30 DIAGNOSIS — Z96642 Presence of left artificial hip joint: Secondary | ICD-10-CM | POA: Diagnosis not present

## 2023-03-30 DIAGNOSIS — Z79890 Hormone replacement therapy (postmenopausal): Secondary | ICD-10-CM | POA: Diagnosis not present

## 2023-03-30 DIAGNOSIS — I890 Lymphedema, not elsewhere classified: Secondary | ICD-10-CM | POA: Diagnosis not present

## 2023-03-30 DIAGNOSIS — Z471 Aftercare following joint replacement surgery: Secondary | ICD-10-CM | POA: Diagnosis not present

## 2023-03-30 DIAGNOSIS — M25552 Pain in left hip: Secondary | ICD-10-CM | POA: Diagnosis not present

## 2023-03-30 DIAGNOSIS — Q984 Klinefelter syndrome, unspecified: Secondary | ICD-10-CM | POA: Diagnosis not present

## 2023-03-31 ENCOUNTER — Other Ambulatory Visit: Payer: Self-pay | Admitting: Family Medicine

## 2023-03-31 DIAGNOSIS — Z8673 Personal history of transient ischemic attack (TIA), and cerebral infarction without residual deficits: Secondary | ICD-10-CM | POA: Diagnosis not present

## 2023-03-31 DIAGNOSIS — Z981 Arthrodesis status: Secondary | ICD-10-CM | POA: Diagnosis not present

## 2023-03-31 DIAGNOSIS — E291 Testicular hypofunction: Secondary | ICD-10-CM | POA: Diagnosis not present

## 2023-03-31 DIAGNOSIS — Z96642 Presence of left artificial hip joint: Secondary | ICD-10-CM | POA: Diagnosis not present

## 2023-03-31 DIAGNOSIS — K5900 Constipation, unspecified: Secondary | ICD-10-CM | POA: Diagnosis not present

## 2023-03-31 DIAGNOSIS — Z7901 Long term (current) use of anticoagulants: Secondary | ICD-10-CM | POA: Diagnosis not present

## 2023-03-31 DIAGNOSIS — Z79891 Long term (current) use of opiate analgesic: Secondary | ICD-10-CM | POA: Diagnosis not present

## 2023-03-31 DIAGNOSIS — D751 Secondary polycythemia: Secondary | ICD-10-CM | POA: Diagnosis not present

## 2023-03-31 DIAGNOSIS — E1169 Type 2 diabetes mellitus with other specified complication: Secondary | ICD-10-CM | POA: Diagnosis not present

## 2023-03-31 DIAGNOSIS — Z471 Aftercare following joint replacement surgery: Secondary | ICD-10-CM | POA: Diagnosis not present

## 2023-03-31 DIAGNOSIS — M5412 Radiculopathy, cervical region: Secondary | ICD-10-CM | POA: Diagnosis not present

## 2023-03-31 DIAGNOSIS — Q984 Klinefelter syndrome, unspecified: Secondary | ICD-10-CM | POA: Diagnosis not present

## 2023-03-31 DIAGNOSIS — E7800 Pure hypercholesterolemia, unspecified: Secondary | ICD-10-CM | POA: Diagnosis not present

## 2023-03-31 DIAGNOSIS — I10 Essential (primary) hypertension: Secondary | ICD-10-CM | POA: Diagnosis not present

## 2023-03-31 DIAGNOSIS — Z7984 Long term (current) use of oral hypoglycemic drugs: Secondary | ICD-10-CM | POA: Diagnosis not present

## 2023-03-31 DIAGNOSIS — Z791 Long term (current) use of non-steroidal anti-inflammatories (NSAID): Secondary | ICD-10-CM | POA: Diagnosis not present

## 2023-04-01 ENCOUNTER — Telehealth: Payer: Self-pay

## 2023-04-01 MED ORDER — BLOOD GLUCOSE MONITORING SUPPL DEVI: 1.0000 | Freq: Three times a day (TID) | 0 refills | Status: DC

## 2023-04-01 MED ORDER — BLOOD GLUCOSE TEST VI STRP: 1.0000 | ORAL_STRIP | Freq: Two times a day (BID) | 1 refills | Status: AC

## 2023-04-01 MED ORDER — LANCETS MISC. MISC: 1.0000 | Freq: Two times a day (BID) | 1 refills | Status: AC

## 2023-04-01 MED ORDER — LANCET DEVICE MISC: 1.0000 | Freq: Three times a day (TID) | 0 refills | Status: AC

## 2023-04-01 NOTE — Telephone Encounter (Signed)
Sent glucometer supplies per protocol.

## 2023-04-01 NOTE — Telephone Encounter (Signed)
Copied from CRM [id number]. Topic: Clinical - Medication Question >> Mar 30, 2023 10:48 AM Dimitri Ped wrote: Reason for CRM: patient is calling to see if the Dr can call him in a glucose monitor with strips. Patient saying he is taking his diabetes serious now

## 2023-04-01 NOTE — Telephone Encounter (Signed)
Rybelsus and glucometer supplies sent to pharmacy today.  Patient informed.

## 2023-04-01 NOTE — Telephone Encounter (Signed)
Copied from CRM [id number]. Topic: Clinical - Medication Question >> Mar 31, 2023  9:41 AM Alcus Dad H wrote: Reason for CRM: Pt called to check on the status of his refill for RYBELSUS 14 MG TABS, he has 1 left and it is showing pending. Pt also called to follow up regarding the doctor ordering him a glucose monitor with strips.

## 2023-04-02 DIAGNOSIS — Z7984 Long term (current) use of oral hypoglycemic drugs: Secondary | ICD-10-CM | POA: Diagnosis not present

## 2023-04-02 DIAGNOSIS — Z7901 Long term (current) use of anticoagulants: Secondary | ICD-10-CM | POA: Diagnosis not present

## 2023-04-02 DIAGNOSIS — Z471 Aftercare following joint replacement surgery: Secondary | ICD-10-CM | POA: Diagnosis not present

## 2023-04-02 DIAGNOSIS — Z981 Arthrodesis status: Secondary | ICD-10-CM | POA: Diagnosis not present

## 2023-04-02 DIAGNOSIS — D751 Secondary polycythemia: Secondary | ICD-10-CM | POA: Diagnosis not present

## 2023-04-02 DIAGNOSIS — Z8673 Personal history of transient ischemic attack (TIA), and cerebral infarction without residual deficits: Secondary | ICD-10-CM | POA: Diagnosis not present

## 2023-04-02 DIAGNOSIS — Z79891 Long term (current) use of opiate analgesic: Secondary | ICD-10-CM | POA: Diagnosis not present

## 2023-04-02 DIAGNOSIS — Z791 Long term (current) use of non-steroidal anti-inflammatories (NSAID): Secondary | ICD-10-CM | POA: Diagnosis not present

## 2023-04-02 DIAGNOSIS — E291 Testicular hypofunction: Secondary | ICD-10-CM | POA: Diagnosis not present

## 2023-04-02 DIAGNOSIS — Z96642 Presence of left artificial hip joint: Secondary | ICD-10-CM | POA: Diagnosis not present

## 2023-04-02 DIAGNOSIS — Q984 Klinefelter syndrome, unspecified: Secondary | ICD-10-CM | POA: Diagnosis not present

## 2023-04-02 DIAGNOSIS — I10 Essential (primary) hypertension: Secondary | ICD-10-CM | POA: Diagnosis not present

## 2023-04-02 DIAGNOSIS — K5900 Constipation, unspecified: Secondary | ICD-10-CM | POA: Diagnosis not present

## 2023-04-02 DIAGNOSIS — M5412 Radiculopathy, cervical region: Secondary | ICD-10-CM | POA: Diagnosis not present

## 2023-04-02 DIAGNOSIS — E7800 Pure hypercholesterolemia, unspecified: Secondary | ICD-10-CM | POA: Diagnosis not present

## 2023-04-02 DIAGNOSIS — E1169 Type 2 diabetes mellitus with other specified complication: Secondary | ICD-10-CM | POA: Diagnosis not present

## 2023-04-04 DIAGNOSIS — D751 Secondary polycythemia: Secondary | ICD-10-CM | POA: Diagnosis not present

## 2023-04-04 DIAGNOSIS — Z471 Aftercare following joint replacement surgery: Secondary | ICD-10-CM | POA: Diagnosis not present

## 2023-04-04 DIAGNOSIS — E1169 Type 2 diabetes mellitus with other specified complication: Secondary | ICD-10-CM | POA: Diagnosis not present

## 2023-04-04 DIAGNOSIS — E7800 Pure hypercholesterolemia, unspecified: Secondary | ICD-10-CM | POA: Diagnosis not present

## 2023-04-04 DIAGNOSIS — Q984 Klinefelter syndrome, unspecified: Secondary | ICD-10-CM | POA: Diagnosis not present

## 2023-04-04 DIAGNOSIS — Z981 Arthrodesis status: Secondary | ICD-10-CM | POA: Diagnosis not present

## 2023-04-04 DIAGNOSIS — Z791 Long term (current) use of non-steroidal anti-inflammatories (NSAID): Secondary | ICD-10-CM | POA: Diagnosis not present

## 2023-04-04 DIAGNOSIS — Z79891 Long term (current) use of opiate analgesic: Secondary | ICD-10-CM | POA: Diagnosis not present

## 2023-04-04 DIAGNOSIS — Z7901 Long term (current) use of anticoagulants: Secondary | ICD-10-CM | POA: Diagnosis not present

## 2023-04-04 DIAGNOSIS — Z7984 Long term (current) use of oral hypoglycemic drugs: Secondary | ICD-10-CM | POA: Diagnosis not present

## 2023-04-04 DIAGNOSIS — M5412 Radiculopathy, cervical region: Secondary | ICD-10-CM | POA: Diagnosis not present

## 2023-04-04 DIAGNOSIS — I10 Essential (primary) hypertension: Secondary | ICD-10-CM | POA: Diagnosis not present

## 2023-04-04 DIAGNOSIS — E291 Testicular hypofunction: Secondary | ICD-10-CM | POA: Diagnosis not present

## 2023-04-04 DIAGNOSIS — Z8673 Personal history of transient ischemic attack (TIA), and cerebral infarction without residual deficits: Secondary | ICD-10-CM | POA: Diagnosis not present

## 2023-04-04 DIAGNOSIS — Z96642 Presence of left artificial hip joint: Secondary | ICD-10-CM | POA: Diagnosis not present

## 2023-04-04 DIAGNOSIS — K5900 Constipation, unspecified: Secondary | ICD-10-CM | POA: Diagnosis not present

## 2023-04-05 ENCOUNTER — Ambulatory Visit: Payer: BC Managed Care – PPO | Admitting: Family Medicine

## 2023-04-05 ENCOUNTER — Encounter: Payer: Self-pay | Admitting: Family Medicine

## 2023-04-05 VITALS — BP 115/73 | HR 100 | Ht 75.0 in | Wt 213.0 lb

## 2023-04-05 DIAGNOSIS — H6991 Unspecified Eustachian tube disorder, right ear: Secondary | ICD-10-CM

## 2023-04-05 DIAGNOSIS — L299 Pruritus, unspecified: Secondary | ICD-10-CM | POA: Diagnosis not present

## 2023-04-05 DIAGNOSIS — H6990 Unspecified Eustachian tube disorder, unspecified ear: Secondary | ICD-10-CM | POA: Insufficient documentation

## 2023-04-05 MED ORDER — HYDROXYZINE PAMOATE 25 MG PO CAPS: 25.0000 mg | ORAL_CAPSULE | Freq: Three times a day (TID) | ORAL | 0 refills | Status: DC | PRN

## 2023-04-05 NOTE — Patient Instructions (Signed)
Try hydroxyzine for itching.   Try cortisone in ear canal  Pick up flonase over the counter

## 2023-04-05 NOTE — Assessment & Plan Note (Signed)
Recommended use of Flonase daily.

## 2023-04-05 NOTE — Assessment & Plan Note (Signed)
Adding Vistaril as needed.  Recommend using moisturizer throughout the day.  Likely related to opioid use for postoperative pain management.

## 2023-04-05 NOTE — Progress Notes (Signed)
[name] - 62 y.o. male MRN [id number]  Date of birth: 01-26-1961  Subjective Chief Complaint  Patient presents with   Otalgia    HPI [name] is a 62 year old male here today with complaint of right ear popping and fullness.  This is intermittent in nature.  Denies ear pain.  Recently had left hip arthroplasty.  Doing well with recovery from this.  He is having some itching from pain medication.  ROS:  A comprehensive ROS was completed and negative except as noted per HPI  Allergies  Allergen Reactions   Acetaminophen Other (See Comments)    jittery   Rosuvastatin Anxiety    Made him feel jittery   Wound Dressing Adhesive Rash   Latex Rash    Past Medical History:  Diagnosis Date   Allergy    Cervical radiculopathy 08/09/2017   Diabetes mellitus without complication (HCC)    Stroke (cerebrum) (HCC) 08/03/2017   Type 2 diabetes mellitus with hyperlipidemia (HCC)     Past Surgical History:  Procedure Laterality Date   ESOPHAGOGASTRODUODENOSCOPY (EGD) WITH PROPOFOL N/A 05/09/2015   Procedure: ESOPHAGOGASTRODUODENOSCOPY (EGD) WITH PROPOFOL;  Surgeon: Iva Boop, MD;  Location: [facility];  Service: Endoscopy;  Laterality: N/A;  with esophageal dilation   LASER ABLATION Left 02/03/15   VARICOSE VEIN SURGERY      Social History   Socioeconomic History   Marital status: Married    Spouse name: Not on file   Number of children: Not on file   Years of education: Not on file   Highest education level: Not on file  Occupational History   Not on file  Tobacco Use   Smoking status: Never   Smokeless tobacco: Never  Vaping Use   Vaping status: Never Used  Substance and Sexual Activity   Alcohol use: No    Alcohol/week: 0.0 standard drinks of alcohol   Drug use: No   Sexual activity: Never  Other Topics Concern   Not on file  Social History Narrative   Not on file   Social Drivers of Health   Financial Resource Strain: Low Risk  (04/23/2022)    Received from [facility]   Overall Financial Resource Strain (CARDIA)    Difficulty of Paying Living Expenses: Not hard at all  Food Insecurity: No Food Insecurity (03/22/2023)   Received from [facility]   Hunger Vital Sign    Worried About Running Out of Food in the Last Year: Never true    Ran Out of Food in the Last Year: Never true  Transportation Needs: No Transportation Needs (03/23/2023)   Received from [facility] - Transportation    Lack of Transportation (Medical): No    Lack of Transportation (Non-Medical): No  Physical Activity: Not on file  Stress: No Stress Concern Present (03/22/2023)   Received from [facility] - Occupational Stress Questionnaire    Feeling of Stress : Not at all  Social Connections: Unknown (08/20/2021)   Received from [facility]   Social Network    Social Network: Not on file    Family History  Problem Relation Age of Onset   Parkinson's disease Father    Heart attack Maternal Grandmother    Diabetes Neg Hx     Health Maintenance  Topic Date Due   Zoster Vaccines- Shingrix (1 of 2) 04/26/2023 (Originally 01/13/2009)   Colonoscopy  05/21/2023 (Originally 01/14/2004)   Hepatitis  C Screening  01/24/2024 (Originally 01/13/1977)   OPHTHALMOLOGY EXAM  05/19/2023   HEMOGLOBIN A1C  07/25/2023   Diabetic kidney evaluation - eGFR measurement  09/24/2023   Diabetic kidney evaluation - Urine ACR  09/24/2023   FOOT EXAM  01/24/2024   DTaP/Tdap/Td (2 - Td or Tdap) 01/30/2031   HIV Screening  Completed   HPV VACCINES  Aged Out   INFLUENZA VACCINE  Discontinued   COVID-19 Vaccine  Discontinued     ----------------------------------------------------------------------------------------------------------------------------------------------------------------------------------------------------------------- Physical Exam BP 115/73 (BP Location: Right Arm, Patient  Position: Sitting, Cuff Size: Normal)   Pulse 100   Ht 6\' 3"  (1.905 m)   Wt 213 lb (96.6 kg)   SpO2 97%   BMI 26.62 kg/m   Physical Exam Constitutional:      Appearance: Normal appearance.  HENT:     Head: Normocephalic and atraumatic.     Ears:     Comments: Right ear with serous effusion Neurological:     Mental Status: He is alert.  Psychiatric:        Mood and Affect: Mood normal.        Behavior: Behavior normal.     ------------------------------------------------------------------------------------------------------------------------------------------------------------------------------------------------------------------- Assessment and Plan  Pruritus Adding Vistaril as needed.  Recommend using moisturizer throughout the day.  Likely related to opioid use for postoperative pain management.  Eustachian tube dysfunction Recommended use of Flonase daily.   Meds ordered this encounter  Medications   hydrOXYzine (VISTARIL) 25 MG capsule    Sig: Take 1 capsule (25 mg total) by mouth 3 (three) times daily as needed.    Dispense:  30 capsule    Refill:  0    No follow-ups on file.    This visit occurred during the SARS-CoV-2 public health emergency.  Safety protocols were in place, including screening questions prior to the visit, additional usage of staff PPE, and extensive cleaning of exam room while observing appropriate contact time as indicated for disinfecting solutions.

## 2023-04-06 ENCOUNTER — Inpatient Hospital Stay: Payer: BC Managed Care – PPO | Admitting: Family Medicine

## 2023-04-06 DIAGNOSIS — Z7901 Long term (current) use of anticoagulants: Secondary | ICD-10-CM | POA: Diagnosis not present

## 2023-04-06 DIAGNOSIS — K5900 Constipation, unspecified: Secondary | ICD-10-CM | POA: Diagnosis not present

## 2023-04-06 DIAGNOSIS — E7800 Pure hypercholesterolemia, unspecified: Secondary | ICD-10-CM | POA: Diagnosis not present

## 2023-04-06 DIAGNOSIS — Z8673 Personal history of transient ischemic attack (TIA), and cerebral infarction without residual deficits: Secondary | ICD-10-CM | POA: Diagnosis not present

## 2023-04-06 DIAGNOSIS — Z981 Arthrodesis status: Secondary | ICD-10-CM | POA: Diagnosis not present

## 2023-04-06 DIAGNOSIS — Z7984 Long term (current) use of oral hypoglycemic drugs: Secondary | ICD-10-CM | POA: Diagnosis not present

## 2023-04-06 DIAGNOSIS — E1169 Type 2 diabetes mellitus with other specified complication: Secondary | ICD-10-CM | POA: Diagnosis not present

## 2023-04-06 DIAGNOSIS — Z791 Long term (current) use of non-steroidal anti-inflammatories (NSAID): Secondary | ICD-10-CM | POA: Diagnosis not present

## 2023-04-06 DIAGNOSIS — E291 Testicular hypofunction: Secondary | ICD-10-CM | POA: Diagnosis not present

## 2023-04-06 DIAGNOSIS — Q984 Klinefelter syndrome, unspecified: Secondary | ICD-10-CM | POA: Diagnosis not present

## 2023-04-06 DIAGNOSIS — Z96642 Presence of left artificial hip joint: Secondary | ICD-10-CM | POA: Diagnosis not present

## 2023-04-06 DIAGNOSIS — Z79891 Long term (current) use of opiate analgesic: Secondary | ICD-10-CM | POA: Diagnosis not present

## 2023-04-06 DIAGNOSIS — D751 Secondary polycythemia: Secondary | ICD-10-CM | POA: Diagnosis not present

## 2023-04-06 DIAGNOSIS — I10 Essential (primary) hypertension: Secondary | ICD-10-CM | POA: Diagnosis not present

## 2023-04-06 DIAGNOSIS — M5412 Radiculopathy, cervical region: Secondary | ICD-10-CM | POA: Diagnosis not present

## 2023-04-06 DIAGNOSIS — Z471 Aftercare following joint replacement surgery: Secondary | ICD-10-CM | POA: Diagnosis not present

## 2023-04-07 DIAGNOSIS — Z96642 Presence of left artificial hip joint: Secondary | ICD-10-CM | POA: Diagnosis not present

## 2023-04-14 DIAGNOSIS — M25551 Pain in right hip: Secondary | ICD-10-CM | POA: Diagnosis not present

## 2023-04-14 DIAGNOSIS — W1839XA Other fall on same level, initial encounter: Secondary | ICD-10-CM | POA: Diagnosis not present

## 2023-04-14 DIAGNOSIS — M25552 Pain in left hip: Secondary | ICD-10-CM | POA: Diagnosis not present

## 2023-04-14 DIAGNOSIS — Z96642 Presence of left artificial hip joint: Secondary | ICD-10-CM | POA: Diagnosis not present

## 2023-04-14 DIAGNOSIS — S7002XA Contusion of left hip, initial encounter: Secondary | ICD-10-CM | POA: Diagnosis not present

## 2023-04-15 ENCOUNTER — Ambulatory Visit: Payer: Self-pay | Admitting: Family Medicine

## 2023-04-15 ENCOUNTER — Telehealth: Payer: Self-pay

## 2023-04-15 NOTE — Telephone Encounter (Signed)
Please review the additional information noted in CRM. Another msg for the same concern was sent to the provider earlier today.

## 2023-04-15 NOTE — Telephone Encounter (Signed)
  Chief Complaint: itching Symptoms: itching on whole body Frequency: constant  Disposition: [] ED /[x] Urgent Care (no appt availability in office) / [] Appointment(In office/virtual)/ []  [facility]/ [] Home Care/ [] Refused Recommended Disposition /[] [facility]/ []  Follow-up with PCP Additional Notes: Pt itching uncontrollably on back, chest, arms, and legs. Pt stated it's almost to the point of bleeding. Wants a steroid shot. Pt stated he has tried all the home remedies and nothing is helping. Itching is getting worse since 12/24. Pt had total hip replacement on 12/3 and thought he was allergic to pain killer so stopped taking them this week. Pt fell yesterday and went to ED  to be checked out. Pt stated he's ok and no further injury occurred. Pt told ED about itching and was told he needed to follow up with PCP. No appts for provider available today. RN advised to go to urgent care today. Pt verbalized care instructions and stated he would go to urgent care.       Reason for Disposition  Looks infected (spreading redness, red streak, pus)  Answer Assessment - Initial Assessment Questions 1. DESCRIPTION: "Describe the itching you are having." "Where is it located?"     Back, arm, legs, chest 2. SEVERITY: "How bad is it?"    - MILD: Doesn't interfere with normal activities.   - MODERATE-SEVERE: Interferes with work, school, sleep, or other activities.      Severe- can't sleep 3. SCRATCHING: "Are there any scratch marks? Bleeding?"     Almost bleeding 4. ONSET: "When did the itching begin?"      12/17 5. CAUSE: "What do you think is causing the itching?"      Surgery  6. OTHER SYMPTOMS: "Do you have any other symptoms?"      Pain killers-possibility  Protocols used: Itching - Localized-A-AH

## 2023-04-15 NOTE — Telephone Encounter (Signed)
Copied from CRM [id number]. Topic: Clinical - Medication Question >> Apr 15, 2023 10:35 AM Desma Mcgregor wrote: Reason for CRM: Pt stated he was prescribed hydrOXYzine (VISTARIL) 25 MG capsule to stop his itching, but its not working. Has also tried chamomile lotion and other products to stop it.  His pharmacy told him that he needs some Prednisone prescribed. Can a Rx be sent over or would the pt need to come into the office? Please inform asap [phone]

## 2023-04-17 ENCOUNTER — Other Ambulatory Visit: Payer: Self-pay | Admitting: Family Medicine

## 2023-04-17 MED ORDER — PREDNISONE 10 MG (21) PO TBPK: ORAL_TABLET | ORAL | 0 refills | Status: DC

## 2023-04-18 NOTE — Telephone Encounter (Signed)
Patient advised.

## 2023-04-28 DIAGNOSIS — Z96642 Presence of left artificial hip joint: Secondary | ICD-10-CM | POA: Diagnosis not present

## 2023-04-28 DIAGNOSIS — Z1330 Encounter for screening examination for mental health and behavioral disorders, unspecified: Secondary | ICD-10-CM | POA: Diagnosis not present

## 2023-04-29 DIAGNOSIS — Z96642 Presence of left artificial hip joint: Secondary | ICD-10-CM | POA: Diagnosis not present

## 2023-05-03 DIAGNOSIS — Z96642 Presence of left artificial hip joint: Secondary | ICD-10-CM | POA: Diagnosis not present

## 2023-05-05 DIAGNOSIS — Z96642 Presence of left artificial hip joint: Secondary | ICD-10-CM | POA: Diagnosis not present

## 2023-05-11 DIAGNOSIS — Z96642 Presence of left artificial hip joint: Secondary | ICD-10-CM | POA: Diagnosis not present

## 2023-05-13 DIAGNOSIS — Z96642 Presence of left artificial hip joint: Secondary | ICD-10-CM | POA: Diagnosis not present

## 2023-05-16 DIAGNOSIS — Z96642 Presence of left artificial hip joint: Secondary | ICD-10-CM | POA: Diagnosis not present

## 2023-05-18 ENCOUNTER — Other Ambulatory Visit: Payer: Self-pay | Admitting: Family Medicine

## 2023-05-18 DIAGNOSIS — E785 Hyperlipidemia, unspecified: Secondary | ICD-10-CM

## 2023-05-18 DIAGNOSIS — Z96642 Presence of left artificial hip joint: Secondary | ICD-10-CM | POA: Diagnosis not present

## 2023-05-23 DIAGNOSIS — Z96642 Presence of left artificial hip joint: Secondary | ICD-10-CM | POA: Diagnosis not present

## 2023-05-23 DIAGNOSIS — E785 Hyperlipidemia, unspecified: Secondary | ICD-10-CM | POA: Diagnosis not present

## 2023-05-23 DIAGNOSIS — R0600 Dyspnea, unspecified: Secondary | ICD-10-CM | POA: Diagnosis not present

## 2023-05-23 DIAGNOSIS — R079 Chest pain, unspecified: Secondary | ICD-10-CM | POA: Diagnosis not present

## 2023-05-25 DIAGNOSIS — Z96642 Presence of left artificial hip joint: Secondary | ICD-10-CM | POA: Diagnosis not present

## 2023-05-30 DIAGNOSIS — Z96642 Presence of left artificial hip joint: Secondary | ICD-10-CM | POA: Diagnosis not present

## 2023-06-01 DIAGNOSIS — Z96642 Presence of left artificial hip joint: Secondary | ICD-10-CM | POA: Diagnosis not present

## 2023-06-02 DIAGNOSIS — R079 Chest pain, unspecified: Secondary | ICD-10-CM | POA: Diagnosis not present

## 2023-06-02 DIAGNOSIS — R0600 Dyspnea, unspecified: Secondary | ICD-10-CM | POA: Diagnosis not present

## 2023-06-02 DIAGNOSIS — E785 Hyperlipidemia, unspecified: Secondary | ICD-10-CM | POA: Diagnosis not present

## 2023-06-05 DIAGNOSIS — R4182 Altered mental status, unspecified: Secondary | ICD-10-CM | POA: Diagnosis not present

## 2023-06-05 DIAGNOSIS — R531 Weakness: Secondary | ICD-10-CM | POA: Diagnosis not present

## 2023-06-05 DIAGNOSIS — E119 Type 2 diabetes mellitus without complications: Secondary | ICD-10-CM | POA: Diagnosis not present

## 2023-06-05 DIAGNOSIS — J9601 Acute respiratory failure with hypoxia: Secondary | ICD-10-CM | POA: Diagnosis not present

## 2023-06-05 DIAGNOSIS — M6281 Muscle weakness (generalized): Secondary | ICD-10-CM | POA: Diagnosis not present

## 2023-06-05 DIAGNOSIS — R000 Tachycardia, unspecified: Secondary | ICD-10-CM | POA: Diagnosis not present

## 2023-06-05 DIAGNOSIS — I2699 Other pulmonary embolism without acute cor pulmonale: Secondary | ICD-10-CM | POA: Diagnosis not present

## 2023-06-05 DIAGNOSIS — R0789 Other chest pain: Secondary | ICD-10-CM | POA: Diagnosis not present

## 2023-06-05 DIAGNOSIS — Z8679 Personal history of other diseases of the circulatory system: Secondary | ICD-10-CM | POA: Diagnosis not present

## 2023-06-05 DIAGNOSIS — I6523 Occlusion and stenosis of bilateral carotid arteries: Secondary | ICD-10-CM | POA: Diagnosis not present

## 2023-06-05 DIAGNOSIS — I2609 Other pulmonary embolism with acute cor pulmonale: Secondary | ICD-10-CM | POA: Diagnosis not present

## 2023-06-05 DIAGNOSIS — I69354 Hemiplegia and hemiparesis following cerebral infarction affecting left non-dominant side: Secondary | ICD-10-CM | POA: Diagnosis not present

## 2023-06-05 DIAGNOSIS — Z7982 Long term (current) use of aspirin: Secondary | ICD-10-CM | POA: Diagnosis not present

## 2023-06-05 DIAGNOSIS — M1990 Unspecified osteoarthritis, unspecified site: Secondary | ICD-10-CM | POA: Diagnosis not present

## 2023-06-05 DIAGNOSIS — Z96642 Presence of left artificial hip joint: Secondary | ICD-10-CM | POA: Diagnosis not present

## 2023-06-05 DIAGNOSIS — Z79890 Hormone replacement therapy (postmenopausal): Secondary | ICD-10-CM | POA: Diagnosis not present

## 2023-06-05 DIAGNOSIS — D751 Secondary polycythemia: Secondary | ICD-10-CM | POA: Diagnosis not present

## 2023-06-05 DIAGNOSIS — R918 Other nonspecific abnormal finding of lung field: Secondary | ICD-10-CM | POA: Diagnosis not present

## 2023-06-05 DIAGNOSIS — R9431 Abnormal electrocardiogram [ECG] [EKG]: Secondary | ICD-10-CM | POA: Diagnosis not present

## 2023-06-05 DIAGNOSIS — R29705 NIHSS score 5: Secondary | ICD-10-CM | POA: Diagnosis not present

## 2023-06-05 DIAGNOSIS — I4510 Unspecified right bundle-branch block: Secondary | ICD-10-CM | POA: Diagnosis not present

## 2023-06-05 DIAGNOSIS — I071 Rheumatic tricuspid insufficiency: Secondary | ICD-10-CM | POA: Diagnosis not present

## 2023-06-05 DIAGNOSIS — I2602 Saddle embolus of pulmonary artery with acute cor pulmonale: Secondary | ICD-10-CM | POA: Diagnosis not present

## 2023-06-05 DIAGNOSIS — G459 Transient cerebral ischemic attack, unspecified: Secondary | ICD-10-CM | POA: Diagnosis not present

## 2023-06-06 DIAGNOSIS — D7589 Other specified diseases of blood and blood-forming organs: Secondary | ICD-10-CM | POA: Diagnosis not present

## 2023-06-06 DIAGNOSIS — I808 Phlebitis and thrombophlebitis of other sites: Secondary | ICD-10-CM | POA: Diagnosis not present

## 2023-06-06 DIAGNOSIS — D751 Secondary polycythemia: Secondary | ICD-10-CM | POA: Diagnosis not present

## 2023-06-06 DIAGNOSIS — R200 Anesthesia of skin: Secondary | ICD-10-CM | POA: Diagnosis not present

## 2023-06-06 DIAGNOSIS — M47812 Spondylosis without myelopathy or radiculopathy, cervical region: Secondary | ICD-10-CM | POA: Diagnosis not present

## 2023-06-06 DIAGNOSIS — I2602 Saddle embolus of pulmonary artery with acute cor pulmonale: Secondary | ICD-10-CM | POA: Diagnosis not present

## 2023-06-06 DIAGNOSIS — I639 Cerebral infarction, unspecified: Secondary | ICD-10-CM | POA: Diagnosis not present

## 2023-06-06 DIAGNOSIS — Z79890 Hormone replacement therapy (postmenopausal): Secondary | ICD-10-CM | POA: Diagnosis not present

## 2023-06-06 DIAGNOSIS — E119 Type 2 diabetes mellitus without complications: Secondary | ICD-10-CM | POA: Diagnosis not present

## 2023-06-06 DIAGNOSIS — N400 Enlarged prostate without lower urinary tract symptoms: Secondary | ICD-10-CM | POA: Diagnosis not present

## 2023-06-06 DIAGNOSIS — I517 Cardiomegaly: Secondary | ICD-10-CM | POA: Diagnosis not present

## 2023-06-06 DIAGNOSIS — N289 Disorder of kidney and ureter, unspecified: Secondary | ICD-10-CM | POA: Diagnosis not present

## 2023-06-06 DIAGNOSIS — I4510 Unspecified right bundle-branch block: Secondary | ICD-10-CM | POA: Diagnosis not present

## 2023-06-06 DIAGNOSIS — R2990 Unspecified symptoms and signs involving the nervous system: Secondary | ICD-10-CM | POA: Diagnosis not present

## 2023-06-06 DIAGNOSIS — I2699 Other pulmonary embolism without acute cor pulmonale: Secondary | ICD-10-CM | POA: Diagnosis not present

## 2023-06-06 DIAGNOSIS — I82422 Acute embolism and thrombosis of left iliac vein: Secondary | ICD-10-CM | POA: Diagnosis not present

## 2023-06-06 DIAGNOSIS — I2692 Saddle embolus of pulmonary artery without acute cor pulmonale: Secondary | ICD-10-CM | POA: Diagnosis not present

## 2023-06-06 DIAGNOSIS — Q2112 Patent foramen ovale: Secondary | ICD-10-CM | POA: Diagnosis not present

## 2023-06-06 DIAGNOSIS — R000 Tachycardia, unspecified: Secondary | ICD-10-CM | POA: Diagnosis not present

## 2023-06-06 DIAGNOSIS — J9601 Acute respiratory failure with hypoxia: Secondary | ICD-10-CM | POA: Diagnosis not present

## 2023-06-06 DIAGNOSIS — Z7982 Long term (current) use of aspirin: Secondary | ICD-10-CM | POA: Diagnosis not present

## 2023-06-06 DIAGNOSIS — I071 Rheumatic tricuspid insufficiency: Secondary | ICD-10-CM | POA: Diagnosis not present

## 2023-06-06 DIAGNOSIS — R0789 Other chest pain: Secondary | ICD-10-CM | POA: Diagnosis not present

## 2023-06-06 DIAGNOSIS — E1169 Type 2 diabetes mellitus with other specified complication: Secondary | ICD-10-CM | POA: Diagnosis not present

## 2023-06-06 DIAGNOSIS — I82432 Acute embolism and thrombosis of left popliteal vein: Secondary | ICD-10-CM | POA: Diagnosis not present

## 2023-06-06 DIAGNOSIS — Z8679 Personal history of other diseases of the circulatory system: Secondary | ICD-10-CM | POA: Diagnosis not present

## 2023-06-06 DIAGNOSIS — M1990 Unspecified osteoarthritis, unspecified site: Secondary | ICD-10-CM | POA: Diagnosis not present

## 2023-06-06 DIAGNOSIS — R9431 Abnormal electrocardiogram [ECG] [EKG]: Secondary | ICD-10-CM | POA: Diagnosis not present

## 2023-06-06 DIAGNOSIS — K219 Gastro-esophageal reflux disease without esophagitis: Secondary | ICD-10-CM | POA: Diagnosis not present

## 2023-06-06 DIAGNOSIS — R4182 Altered mental status, unspecified: Secondary | ICD-10-CM | POA: Diagnosis not present

## 2023-06-06 DIAGNOSIS — R109 Unspecified abdominal pain: Secondary | ICD-10-CM | POA: Diagnosis not present

## 2023-06-06 DIAGNOSIS — E785 Hyperlipidemia, unspecified: Secondary | ICD-10-CM | POA: Diagnosis not present

## 2023-06-06 DIAGNOSIS — R479 Unspecified speech disturbances: Secondary | ICD-10-CM | POA: Diagnosis not present

## 2023-06-06 DIAGNOSIS — Q984 Klinefelter syndrome, unspecified: Secondary | ICD-10-CM | POA: Diagnosis not present

## 2023-06-06 DIAGNOSIS — I82442 Acute embolism and thrombosis of left tibial vein: Secondary | ICD-10-CM | POA: Diagnosis not present

## 2023-06-06 DIAGNOSIS — R531 Weakness: Secondary | ICD-10-CM | POA: Diagnosis not present

## 2023-06-06 DIAGNOSIS — I63532 Cerebral infarction due to unspecified occlusion or stenosis of left posterior cerebral artery: Secondary | ICD-10-CM | POA: Diagnosis not present

## 2023-06-06 DIAGNOSIS — I69354 Hemiplegia and hemiparesis following cerebral infarction affecting left non-dominant side: Secondary | ICD-10-CM | POA: Diagnosis not present

## 2023-06-06 DIAGNOSIS — R209 Unspecified disturbances of skin sensation: Secondary | ICD-10-CM | POA: Diagnosis not present

## 2023-06-06 DIAGNOSIS — M4802 Spinal stenosis, cervical region: Secondary | ICD-10-CM | POA: Diagnosis not present

## 2023-06-06 DIAGNOSIS — M5030 Other cervical disc degeneration, unspecified cervical region: Secondary | ICD-10-CM | POA: Diagnosis not present

## 2023-06-06 DIAGNOSIS — G8194 Hemiplegia, unspecified affecting left nondominant side: Secondary | ICD-10-CM | POA: Diagnosis not present

## 2023-06-06 DIAGNOSIS — K5730 Diverticulosis of large intestine without perforation or abscess without bleeding: Secondary | ICD-10-CM | POA: Diagnosis not present

## 2023-06-06 DIAGNOSIS — Z96642 Presence of left artificial hip joint: Secondary | ICD-10-CM | POA: Diagnosis not present

## 2023-06-08 DIAGNOSIS — I639 Cerebral infarction, unspecified: Secondary | ICD-10-CM | POA: Insufficient documentation

## 2023-06-12 DIAGNOSIS — R918 Other nonspecific abnormal finding of lung field: Secondary | ICD-10-CM | POA: Insufficient documentation

## 2023-06-13 ENCOUNTER — Telehealth: Payer: Self-pay

## 2023-06-13 NOTE — Transitions of Care (Post Inpatient/ED Visit) (Signed)
   06/13/2023  Name: BRAYDEN BRODHEAD MRN: [id number] DOB: 08-09-1960  Today's TOC FU Call Status: Today's TOC FU Call Status:: Unsuccessful Call (1st Attempt) Unsuccessful Call (1st Attempt) Date: 06/13/23  Attempted to reach the patient regarding the most recent Inpatient/ED visit.  Follow Up Plan: Additional outreach attempts will be made to reach the patient to complete the Transitions of Care (Post Inpatient/ED visit) call.   Hilbert Odor RN, CCM [place]  VBCI-Population Health RN Care Manager [phone]

## 2023-06-14 ENCOUNTER — Telehealth: Payer: Self-pay | Admitting: *Deleted

## 2023-06-14 DIAGNOSIS — Z1330 Encounter for screening examination for mental health and behavioral disorders, unspecified: Secondary | ICD-10-CM | POA: Diagnosis not present

## 2023-06-14 DIAGNOSIS — Q2112 Patent foramen ovale: Secondary | ICD-10-CM | POA: Diagnosis not present

## 2023-06-14 DIAGNOSIS — Z8673 Personal history of transient ischemic attack (TIA), and cerebral infarction without residual deficits: Secondary | ICD-10-CM | POA: Diagnosis not present

## 2023-06-14 DIAGNOSIS — E785 Hyperlipidemia, unspecified: Secondary | ICD-10-CM | POA: Diagnosis not present

## 2023-06-14 DIAGNOSIS — E1169 Type 2 diabetes mellitus with other specified complication: Secondary | ICD-10-CM | POA: Diagnosis not present

## 2023-06-14 NOTE — Transitions of Care (Post Inpatient/ED Visit) (Signed)
   06/14/2023  Name: [name] MRN: [id number] DOB: 23-Feb-1961  Today's TOC FU Call Status: Today's TOC FU Call Status:: Unsuccessful Call (2nd Attempt) Unsuccessful Call (2nd Attempt) Date: 06/14/23  Attempted to reach the patient regarding the most recent Inpatient/ED visit.  Follow Up Plan: Additional outreach attempts will be made to reach the patient to complete the Transitions of Care (Post Inpatient/ED visit) call.   Gean Maidens BSN RN [facility] Health Care Management Coordinator [email or web address] Direct Dial: [phone]  Fax: [phone] Website: [email or web address]

## 2023-06-15 ENCOUNTER — Telehealth: Payer: Self-pay | Admitting: *Deleted

## 2023-06-15 DIAGNOSIS — Q2112 Patent foramen ovale: Secondary | ICD-10-CM | POA: Diagnosis not present

## 2023-06-15 DIAGNOSIS — I69359 Hemiplegia and hemiparesis following cerebral infarction affecting unspecified side: Secondary | ICD-10-CM | POA: Diagnosis not present

## 2023-06-15 DIAGNOSIS — R0600 Dyspnea, unspecified: Secondary | ICD-10-CM | POA: Diagnosis not present

## 2023-06-15 DIAGNOSIS — E785 Hyperlipidemia, unspecified: Secondary | ICD-10-CM | POA: Diagnosis not present

## 2023-06-15 NOTE — Transitions of Care (Post Inpatient/ED Visit) (Signed)
 06/15/2023  Name: [name] MRN: [id number] DOB: 1960-12-01  Today's TOC FU Call Status: Today's TOC FU Call Status:: Successful TOC FU Call Completed TOC FU Call Complete Date: 06/15/23 Patient's Name and Date of Birth confirmed.  Transition Care Management Follow-up Telephone Call Date of Discharge: 06/12/23 Discharge Facility: Other ([facility] Facility) Name of Other ([facility]) [facility]charge Facility: [facility] Type of Discharge: Inpatient Admission Primary Inpatient Discharge Diagnosis:: Acute Saddle Pulmonary Embolism with Acute Cor Pulmonale How have you been since you were released from the hospital?: Better (patient had episode of shakiness this morning. Patient made the cardiologist aware) Any questions or concerns?: Yes Patient Questions/Concerns Addressed: Other:  Items Reviewed: Did you receive and understand the discharge instructions provided?: Yes Medications obtained,verified, and reconciled?: Yes (Medications Reviewed) Any new allergies since your discharge?: No Dietary orders reviewed?: Yes Type of Diet Ordered:: low sodium heart healthy carb modified Do you have support at home?: Yes People in Home: spouse Name of Support/Comfort Primary Source: Sheri  Medications Reviewed Today: Medications Reviewed Today     Reviewed by Luella Cook, RN (Case Manager) on 06/15/23 at 1537  Med List Status: <None>   Medication Order Taking? Sig Documenting Provider Last Dose Status Informant  ASHWAGANDHA PO [id number] No Take by mouth.  Patient not taking: Reported on 09/24/2022   [provider] Not Taking Active   aspirin EC 81 MG tablet [id number] Yes Take 81 mg by mouth daily. Swallow whole. [provider] Taking Active   B-D 3CC LUER-LOK SYR 22GX1-1/2 22G X 1-1/2" 3 ML MISC [id number] Yes  [provider] Taking Active   Blood Glucose Monitoring Suppl DEVI [id number] Yes 1 each by Does not apply route in the morning, at noon,  and at bedtime. May substitute to any manufacturer covered by patient's insurance. Everrett Coombe, DO Taking Active   dapagliflozin propanediol (FARXIGA) 5 MG TABS tablet [id number] Yes Take 1 tablet (5 mg total) by mouth daily. Everrett Coombe, DO Taking Active   Evolocumab 140 MG/ML SOAJ [id number]  Inject 140 mg into the skin every 14 (fourteen) days.  Patient not taking: Reported on 04/05/2023   [provider]  Active   gabapentin (NEURONTIN) 300 MG capsule [id number] Yes Take 1 capsule (300 mg total) by mouth 3 (three) times daily. For continued shingles pain Everrett Coombe, DO Taking Active   glimepiride (AMARYL) 2 MG tablet [id number] Yes TAKE 1 TABLET BY MOUTH DAILY BEFORE BREAKFAST Everrett Coombe, DO Taking Active   HYDROcodone-acetaminophen (NORCO/VICODIN) 5-325 MG tablet [id number] Yes Take 1 tablet by mouth every 4 (four) hours as needed. [provider] Taking Active   HYDROmorphone (DILAUDID) 2 MG tablet [id number] Yes Take 2 mg by mouth every 4 (four) hours as needed. [provider] Taking Active   hydrOXYzine (VISTARIL) 25 MG capsule [id number] Yes Take 1 capsule (25 mg total) by mouth 3 (three) times daily as needed. Everrett Coombe, DO Taking Active   meloxicam (MOBIC) 15 MG tablet [id number] Yes Take 1 tablet (15 mg total) by mouth daily as needed for pain (joint pain). Everrett Coombe, DO Taking Active   MICROLET LANCETS MISC [id number]  1 each by Does not apply route daily. Use to monitor glucose levels daily; E11.69; Please provide lancets compatible for Contour device that is covered by pt insurance  Patient not taking: Reported on 04/05/2023   Romero Belling, MD  Active   Multiple Vitamin (MULTIVITAMIN) tablet [id number] No Take 1 tablet by mouth daily.  Patient not  taking: Reported on 01/24/2023   [provider] Not Taking Active   Omega-3 Fatty Acids (FISH OIL PO) [id number] No Take by mouth.  Patient not taking: Reported on 01/24/2023   [provider] Not Taking Active   omeprazole (PRILOSEC) 20 MG capsule [id number] No Take 20 mg by mouth daily.  Patient not taking: Reported on 06/15/2023   [provider] Not Taking Consider Medication Status and Discontinue   ondansetron (ZOFRAN) 4 MG tablet [id number] Yes Take 4 mg by mouth every 8 (eight) hours as needed. [provider] Taking Active   predniSONE (STERAPRED UNI-PAK 21 TAB) 10 MG (21) TBPK tablet [id number] No Taper as directed on packaging  Patient not taking: Reported on 06/15/2023   Everrett Coombe, DO Not Taking Active   RYBELSUS 14 MG TABS [id number] Yes TAKE 1 TABLET BY MOUTH DAILY Everrett Coombe, DO Taking Active   senna-docusate (SENOKOT-S) 8.6-50 MG tablet [id number] Yes Take 1 tablet by mouth at bedtime as needed. [provider] Taking Active   tadalafil (CIALIS) 5 MG tablet [id number]  Take 1 tablet (5 mg total) by mouth daily.  Patient not taking: Reported on 04/05/2023   Everrett Coombe, DO  Active   Testosterone 12.5 MG/ACT (1%) GEL [id number] No SMARTSIG:2 Pump T-DERMAL Daily  Patient not taking: Reported on 06/15/2023   [provider] Not Taking Active   traMADol (ULTRAM) 50 MG tablet [id number] Yes Take 50-100 mg by mouth every 8 (eight) hours as needed. [provider] Taking Active   triamcinolone cream (KENALOG) 0.1 % [id number] No Apply 1 application topically 2 (two) times daily.  Patient not taking: Reported on 09/24/2022   Everrett Coombe, DO Not Taking Active             Home Care and Equipment/Supplies: Were Home Health Services Ordered?: NA Any new equipment or medical supplies ordered?: NA  Functional Questionnaire: Do you need assistance with bathing/showering or dressing?: Yes Do you need assistance with meal preparation?: Yes Do you need assistance with eating?: No Do you have difficulty maintaining continence: No Do you need assistance with getting out of bed/getting out of a chair/moving?:  Yes Do you have difficulty managing or taking your medications?: No  Follow up appointments reviewed: PCP Follow-up appointment confirmed?: Yes Date of PCP follow-up appointment?: 06/21/23 Follow-up Provider: Dr Everrett Coombe Specialist [facility] Follow-up appointment confirmed?: Yes Date of Specialist follow-up appointment?: 06/15/23 Follow-Up Specialty Provider:: Lisabeth Pick Pa/ [id number] [facility] Do you need transportation to your follow-up appointment?: No Do you understand care options if your condition(s) worsen?: Yes-patient verbalized understanding  SDOH Interventions Today    Flowsheet Row Most Recent Value  SDOH Interventions   Food Insecurity Interventions Intervention Not Indicated  Housing Interventions Intervention Not Indicated  Transportation Interventions Intervention Not Indicated, Patient Resources (Friends/Family)  Utilities Interventions Intervention Not Indicated       Goals Addressed             This Visit's Progress    TOC care plan       Current Barriers:  Knowledge Deficits related to plan of care for management of acute saddle pulmonary embolism with acute cor pulmonale and TIA.   RNCM Clinical Goal(s):  Patient will work with the Care Management team over the next 30 days to address Transition of Care Barriers: Medication Management take all medications exactly as prescribed and will call provider for medication related questions as evidenced by Electronic medical record attend all scheduled medical appointments:  PCP and specialist as evidenced by Electronic medical record  through collaboration with RN Care manager, provider, and care team.   Interventions: Evaluation of current treatment plan related to  self management and patient's adherence to plan as established by provider RN discussed the BEFAST symptoms for a stroke RN discussed Carb modified low sodium heart healthy diet  Transitions of Care:  New goal. Doctor Visits   - discussed the importance of doctor visits  Patient Goals/Self-Care Activities: Participate in Transition of Care Program/Attend [facility] scheduled calls Notify RN Care Manager of TOC call rescheduling needs Take all medications as prescribed Attend all scheduled provider appointments Call pharmacy for medication refills 3-7 days in advance of running out of medications Call provider office for new concerns or questions   Follow Up Plan:  Telephone follow up appointment with care management team member scheduled for:  Hilbert Odor on 06/23/2023 10:30 The patient has been provided with contact information for the care management team and has been advised to call with any health related questions or concerns.  Next PCP appointment scheduled for:          Patient consented to follow up outreach calls from [facility] Peggy Loge BSN RN [facility] Health Care Management Coordinator [email or web address] Direct Dial: [phone]  Fax: [phone] Website: [email or web address]

## 2023-06-21 ENCOUNTER — Inpatient Hospital Stay: Payer: BC Managed Care – PPO | Admitting: Family Medicine

## 2023-06-21 DIAGNOSIS — R531 Weakness: Secondary | ICD-10-CM | POA: Diagnosis not present

## 2023-06-21 DIAGNOSIS — Z96642 Presence of left artificial hip joint: Secondary | ICD-10-CM | POA: Diagnosis not present

## 2023-06-21 DIAGNOSIS — M25552 Pain in left hip: Secondary | ICD-10-CM | POA: Diagnosis not present

## 2023-06-21 DIAGNOSIS — I639 Cerebral infarction, unspecified: Secondary | ICD-10-CM | POA: Diagnosis not present

## 2023-06-23 ENCOUNTER — Inpatient Hospital Stay: Admitting: Family Medicine

## 2023-06-23 ENCOUNTER — Other Ambulatory Visit: Payer: Self-pay

## 2023-06-23 DIAGNOSIS — M25552 Pain in left hip: Secondary | ICD-10-CM | POA: Diagnosis not present

## 2023-06-23 DIAGNOSIS — Z96642 Presence of left artificial hip joint: Secondary | ICD-10-CM | POA: Diagnosis not present

## 2023-06-23 DIAGNOSIS — R531 Weakness: Secondary | ICD-10-CM | POA: Diagnosis not present

## 2023-06-23 DIAGNOSIS — I639 Cerebral infarction, unspecified: Secondary | ICD-10-CM | POA: Diagnosis not present

## 2023-06-23 NOTE — Patient Outreach (Signed)
 Care Management  Transitions of Care Program Transitions of Care Post-discharge week 2  06/23/2023 Name: [name] MRN: [id number] DOB: October 25, 1960  Subjective: [name] is a 63 y.o. year old male who is a primary care patient of Everrett Coombe, DO. The Care Management team was unable to reach the patient by phone to assess and address transitions of care needs.   Plan: Additional outreach attempts will be made to reach the patient enrolled in the [facility] Program (Post Inpatient/ED Visit).  Hilbert Odor RN, CCM [place]  VBCI-Population Health RN Care Manager [phone]

## 2023-06-24 ENCOUNTER — Telehealth: Payer: Self-pay

## 2023-06-24 DIAGNOSIS — I69359 Hemiplegia and hemiparesis following cerebral infarction affecting unspecified side: Secondary | ICD-10-CM | POA: Diagnosis not present

## 2023-06-24 DIAGNOSIS — Z7982 Long term (current) use of aspirin: Secondary | ICD-10-CM | POA: Diagnosis not present

## 2023-06-24 DIAGNOSIS — I2602 Saddle embolus of pulmonary artery with acute cor pulmonale: Secondary | ICD-10-CM | POA: Diagnosis not present

## 2023-06-24 DIAGNOSIS — E349 Endocrine disorder, unspecified: Secondary | ICD-10-CM | POA: Diagnosis not present

## 2023-06-24 DIAGNOSIS — D751 Secondary polycythemia: Secondary | ICD-10-CM | POA: Diagnosis not present

## 2023-06-24 DIAGNOSIS — Q2112 Patent foramen ovale: Secondary | ICD-10-CM | POA: Diagnosis not present

## 2023-06-24 DIAGNOSIS — Z7901 Long term (current) use of anticoagulants: Secondary | ICD-10-CM | POA: Diagnosis not present

## 2023-06-24 NOTE — Patient Outreach (Signed)
 Care Management  Transitions of Care Program Transitions of Care Post-discharge week 2  06/24/2023 Name: [name] MRN: [id number] DOB: Nov 05, 1960  Subjective: NICO SYME is a 63 y.o. year old male who is a primary care patient of Everrett Coombe, DO. The Care Management team was unable to reach the patient by phone to assess and address transitions of care needs.   Plan: Additional outreach attempts will be made to reach the patient enrolled in the [facility] Program (Post Inpatient/ED Visit).  Hilbert Odor RN, CCM [place]  VBCI-Population Health RN Care Manager [phone]

## 2023-06-28 ENCOUNTER — Telehealth: Payer: Self-pay

## 2023-06-28 DIAGNOSIS — I82812 Embolism and thrombosis of superficial veins of left lower extremities: Secondary | ICD-10-CM | POA: Diagnosis not present

## 2023-06-28 DIAGNOSIS — I82432 Acute embolism and thrombosis of left popliteal vein: Secondary | ICD-10-CM | POA: Diagnosis not present

## 2023-06-28 DIAGNOSIS — I82462 Acute embolism and thrombosis of left calf muscular vein: Secondary | ICD-10-CM | POA: Diagnosis not present

## 2023-06-28 NOTE — Patient Outreach (Signed)
 Care Management  Transitions of Care Program Transitions of Care Post-discharge week 2  06/28/2023 Name: [name] MRN: [id number] DOB: June 26, 1960  Subjective: [name] is a 63 y.o. year old male who is a primary care patient of Everrett Coombe, DO. The Care Management team spoke with patient by telephone to assess and address transitions of care needs. Patient states this is not a good time and requested call back tomorrow.   Plan: Additional outreach attempts will be made to reach the patient enrolled in the [facility] Program (Post Inpatient/ED Visit).  Hilbert Odor RN, CCM [place]  VBCI-Population Health RN Care Manager [phone]

## 2023-06-29 ENCOUNTER — Other Ambulatory Visit: Payer: BC Managed Care – PPO

## 2023-06-29 ENCOUNTER — Telehealth: Payer: Self-pay

## 2023-06-29 DIAGNOSIS — R413 Other amnesia: Secondary | ICD-10-CM | POA: Diagnosis not present

## 2023-06-29 DIAGNOSIS — E785 Hyperlipidemia, unspecified: Secondary | ICD-10-CM | POA: Diagnosis not present

## 2023-06-29 DIAGNOSIS — E1169 Type 2 diabetes mellitus with other specified complication: Secondary | ICD-10-CM | POA: Diagnosis not present

## 2023-06-29 DIAGNOSIS — I82432 Acute embolism and thrombosis of left popliteal vein: Secondary | ICD-10-CM | POA: Diagnosis not present

## 2023-06-29 DIAGNOSIS — Z8673 Personal history of transient ischemic attack (TIA), and cerebral infarction without residual deficits: Secondary | ICD-10-CM | POA: Diagnosis not present

## 2023-06-29 NOTE — Patient Outreach (Signed)
 Care Management  Transitions of Care Program Transitions of Care Post-discharge week 2  06/29/2023 Name: [name] MRN: [id number] DOB: [birth year]  Subjective: [name] is a 63 y.o. year old male who is a primary care patient of Everrett Coombe, DO. The Care Management team was unable to reach the patient by phone to assess and address transitions of care needs.    Plan: No further outreach attempts will be made at this time.  We have been unable to reach the patient. Hilbert Odor RN, CCM [place]  VBCI-Population Health RN Care Manager [phone]

## 2023-06-30 DIAGNOSIS — Z96642 Presence of left artificial hip joint: Secondary | ICD-10-CM | POA: Diagnosis not present

## 2023-06-30 DIAGNOSIS — R531 Weakness: Secondary | ICD-10-CM | POA: Diagnosis not present

## 2023-06-30 DIAGNOSIS — R41841 Cognitive communication deficit: Secondary | ICD-10-CM | POA: Insufficient documentation

## 2023-06-30 DIAGNOSIS — I639 Cerebral infarction, unspecified: Secondary | ICD-10-CM | POA: Diagnosis not present

## 2023-06-30 DIAGNOSIS — M25552 Pain in left hip: Secondary | ICD-10-CM | POA: Diagnosis not present

## 2023-07-07 DIAGNOSIS — Z96642 Presence of left artificial hip joint: Secondary | ICD-10-CM | POA: Diagnosis not present

## 2023-07-07 DIAGNOSIS — I639 Cerebral infarction, unspecified: Secondary | ICD-10-CM | POA: Diagnosis not present

## 2023-07-07 DIAGNOSIS — M25552 Pain in left hip: Secondary | ICD-10-CM | POA: Diagnosis not present

## 2023-07-07 DIAGNOSIS — R531 Weakness: Secondary | ICD-10-CM | POA: Diagnosis not present

## 2023-07-12 ENCOUNTER — Telehealth: Payer: Self-pay

## 2023-07-12 DIAGNOSIS — M25552 Pain in left hip: Secondary | ICD-10-CM | POA: Diagnosis not present

## 2023-07-12 DIAGNOSIS — Z96642 Presence of left artificial hip joint: Secondary | ICD-10-CM | POA: Diagnosis not present

## 2023-07-12 NOTE — Telephone Encounter (Signed)
 Task completed. Rx managed by Hematologist specialist. Patient's wife informed to contact the specialist office for the Xarelto 20 mg refill. Agreeable with recommendation. Per provider's request, patient scheduled for a hospital follow up.

## 2023-07-12 NOTE — Telephone Encounter (Signed)
 Copied from CRM [id number]. Topic: Clinical - Medication Question >> Jul 12, 2023  9:25 AM Everette C wrote: Reason for CRM: Morrie Sheldon with [facility] would like to speak with a member of clinical staff regarding the patient and continuing a prescription for xarelto   The patient was prescribed the medication during a recent hospital stay and will soon be out. Morrie Sheldon would like to further discuss/coordinate a refill for the patient.  Morrie Sheldon can be reached at [phone]                                                [phone]

## 2023-07-13 DIAGNOSIS — R531 Weakness: Secondary | ICD-10-CM | POA: Diagnosis not present

## 2023-07-13 DIAGNOSIS — I69398 Other sequelae of cerebral infarction: Secondary | ICD-10-CM | POA: Diagnosis not present

## 2023-07-14 ENCOUNTER — Encounter: Payer: Self-pay | Admitting: Family Medicine

## 2023-07-14 ENCOUNTER — Ambulatory Visit (INDEPENDENT_AMBULATORY_CARE_PROVIDER_SITE_OTHER): Admitting: Family Medicine

## 2023-07-14 VITALS — BP 115/65 | HR 88 | Ht 75.0 in | Wt 225.0 lb

## 2023-07-14 DIAGNOSIS — Q2112 Patent foramen ovale: Secondary | ICD-10-CM | POA: Diagnosis not present

## 2023-07-14 DIAGNOSIS — E291 Testicular hypofunction: Secondary | ICD-10-CM

## 2023-07-14 DIAGNOSIS — I69359 Hemiplegia and hemiparesis following cerebral infarction affecting unspecified side: Secondary | ICD-10-CM | POA: Diagnosis not present

## 2023-07-14 DIAGNOSIS — I639 Cerebral infarction, unspecified: Secondary | ICD-10-CM | POA: Diagnosis not present

## 2023-07-14 DIAGNOSIS — I2602 Saddle embolus of pulmonary artery with acute cor pulmonale: Secondary | ICD-10-CM

## 2023-07-14 DIAGNOSIS — R531 Weakness: Secondary | ICD-10-CM | POA: Diagnosis not present

## 2023-07-14 DIAGNOSIS — L299 Pruritus, unspecified: Secondary | ICD-10-CM

## 2023-07-14 DIAGNOSIS — M25552 Pain in left hip: Secondary | ICD-10-CM | POA: Diagnosis not present

## 2023-07-14 DIAGNOSIS — Z96642 Presence of left artificial hip joint: Secondary | ICD-10-CM | POA: Diagnosis not present

## 2023-07-14 MED ORDER — HYDROXYZINE HCL 50 MG PO TABS: 50.0000 mg | ORAL_TABLET | Freq: Three times a day (TID) | ORAL | 1 refills | Status: AC | PRN

## 2023-07-14 NOTE — Assessment & Plan Note (Signed)
 Continue aspirin along with Xarelto.  PFO noted on TEE.  Plan is to repair this at some point.  He will continue to follow with cardiology and neurology.

## 2023-07-14 NOTE — Assessment & Plan Note (Signed)
 Hypogonadism secondary to Klinefelter syndrome.  Advised to remain off of testosterone at this time as it is thought this may have contributed to DVT.

## 2023-07-14 NOTE — Assessment & Plan Note (Signed)
 Increased pruritus with NOAC use.  Stable with Vistaril as needed.

## 2023-07-14 NOTE — Assessment & Plan Note (Signed)
 Continue with Xarelto at current strength.  He is seeing heme-onc.  Additional hypercoagulability testing is to be completed per patient.

## 2023-07-14 NOTE — Progress Notes (Signed)
 [name] - 63 y.o. male MRN [id number]  Date of birth: [birth year]  Subjective Chief Complaint  Patient presents with   Hospitalization Follow-up    HPI [name] is a 63 year old male here today for follow-up.  He was hospitalized last month due to acute onset saddle pulmonary embolus.  He had initially presented with left-sided weakness and speech difficulty as well as transient pain in his inner left thigh.  Upon initial EMS evaluation on he was hypoxic with SpO2 in the 60s with cyanotic appearance.  He was transferred from outside hospital to [facility] for consideration of pharmacomechanical thrombolysis.  His echocardiogram showed preserved LVEF with flattened IV septum and signs of right heart strain.  He was initially on heparin therapy and transition to NOAC.  MRI of the brain was completed and showed small areas of infarct in left parietal lobe and posterior left frontal lobe.  Echo with bubble study did show intra-atrial shunt.  TEE confirmed PFO.  Neurology recommended follow-up with cardiology to discuss PFO closure.  He was initially started on eliquis however changed to Xarelto due to severe itching with Eliquis.  He does continue to have some itching with Xarelto controlled with Vistaril as needed.  He has had follow-up with hematology and hypercoagulability studies have been completed.  Mild elevation in lupus anticoagulant.  Recommended to avoid testosterone therapy as it was felt that this may be contributing to his DVT PE.  He does have follow-up for additional hypercoagulability studies.  Additionally, he is seeing cardiology outpatient with planned repair PFO.  ROS:  A comprehensive ROS was completed and negative except as noted per HPI  Allergies  Allergen Reactions   Apixaban Itching   Acetaminophen Other (See Comments)    jittery   Rosuvastatin Anxiety    Made him feel jittery   Wound Dressing Adhesive Rash   Latex Rash    Past Medical  History:  Diagnosis Date   Allergy    Cervical radiculopathy 08/09/2017   Diabetes mellitus without complication (HCC)    Stroke (cerebrum) (HCC) 08/03/2017   Type 2 diabetes mellitus with hyperlipidemia (HCC)     Past Surgical History:  Procedure Laterality Date   ESOPHAGOGASTRODUODENOSCOPY (EGD) WITH PROPOFOL N/A 05/09/2015   Procedure: ESOPHAGOGASTRODUODENOSCOPY (EGD) WITH PROPOFOL;  Surgeon: Iva Boop, MD;  Location: [facility];  Service: Endoscopy;  Laterality: N/A;  with esophageal dilation   LASER ABLATION Left 02/03/15   VARICOSE VEIN SURGERY      Social History   Socioeconomic History   Marital status: Married    Spouse name: Not on file   Number of children: Not on file   Years of education: Not on file   Highest education level: Not on file  Occupational History   Not on file  Tobacco Use   Smoking status: Never   Smokeless tobacco: Never  Vaping Use   Vaping status: Never Used  Substance and Sexual Activity   Alcohol use: No    Alcohol/week: 0.0 standard drinks of alcohol   Drug use: No   Sexual activity: Never  Other Topics Concern   Not on file  Social History Narrative   Not on file   Social Drivers of Health   Financial Resource Strain: Low Risk  (06/09/2023)   Received from [facility]   Overall Financial Resource Strain (CARDIA)    Difficulty of Paying Living Expenses: Not very hard  Food Insecurity: No Food Insecurity (06/15/2023)   Hunger Vital Sign  Worried About Programme researcher, broadcasting/film/video in the Last Year: Never true    Ran Out of Food in the Last Year: Never true  Transportation Needs: No Transportation Needs (06/15/2023)   PRAPARE - Administrator, Civil Service (Medical): No    Lack of Transportation (Non-Medical): No  Physical Activity: Not on file  Stress: No Stress Concern Present (06/06/2023)   Received from [facility] of Occupational Health - Occupational Stress Questionnaire    Feeling of Stress  : Not at all  Social Connections: Unknown (08/20/2021)   Received from [facility]   Social Network    Social Network: Not on file    Family History  Problem Relation Age of Onset   Parkinson's disease Father    Heart attack Maternal Grandmother    Diabetes Neg Hx     Health Maintenance  Topic Date Due   Zoster Vaccines- Shingrix (1 of 2) Never done   Pneumococcal Vaccine 47-24 Years old (2 of 2 - PCV) 04/07/2019   OPHTHALMOLOGY EXAM  05/19/2023   Hepatitis C Screening  01/24/2024 (Originally 01/13/1979)   HEMOGLOBIN A1C  07/25/2023   Diabetic kidney evaluation - eGFR measurement  09/24/2023   Diabetic kidney evaluation - Urine ACR  09/24/2023   FOOT EXAM  01/24/2024   Colonoscopy  09/03/2028   DTaP/Tdap/Td (4 - Td or Tdap) 01/30/2031   HIV Screening  Completed   HPV VACCINES  Aged Out   INFLUENZA VACCINE  Discontinued   COVID-19 Vaccine  Discontinued     ----------------------------------------------------------------------------------------------------------------------------------------------------------------------------------------------------------------- Physical Exam BP 115/65 (BP Location: Left Arm, Patient Position: Sitting, Cuff Size: Normal)   Pulse 88   Ht 6\' 3"  (1.905 m)   Wt 225 lb (102.1 kg)   SpO2 95%   BMI 28.12 kg/m   Physical Exam Constitutional:      Appearance: Normal appearance.  HENT:     Head: Normocephalic and atraumatic.  Eyes:     General: No scleral icterus. Cardiovascular:     Rate and Rhythm: Normal rate and regular rhythm.  Pulmonary:     Effort: Pulmonary effort is normal.     Breath sounds: Normal breath sounds.  Musculoskeletal:     Cervical back: Neck supple.  Neurological:     Mental Status: He is alert.  Psychiatric:        Mood and Affect: Mood normal.        Behavior: Behavior normal.      ------------------------------------------------------------------------------------------------------------------------------------------------------------------------------------------------------------------- Assessment and Plan  Cardioembolic stroke, old, with hemiparesis (HCC) Continue aspirin along with Xarelto.  PFO noted on TEE.  Plan is to repair this at some point.  He will continue to follow with cardiology and neurology.  Hypogonadism in male Hypogonadism secondary to Klinefelter syndrome.  Advised to remain off of testosterone at this time as it is thought this may have contributed to DVT.  Pruritus Increased pruritus with NOAC use.  Stable with Vistaril as needed.  Saddle embolus of pulmonary artery with acute cor pulmonale (HCC) Continue with Xarelto at current strength.  He is seeing heme-onc.  Additional hypercoagulability testing is to be completed per patient.   Meds ordered this encounter  Medications   hydrOXYzine (ATARAX) 50 MG tablet    Sig: Take 1 tablet (50 mg total) by mouth 3 (three) times daily as needed for up to 10 days for itching.    Dispense:  30 tablet    Refill:  1    No  follow-ups on file.    This visit occurred during the SARS-CoV-2 public health emergency.  Safety protocols were in place, including screening questions prior to the visit, additional usage of staff PPE, and extensive cleaning of exam room while observing appropriate contact time as indicated for disinfecting solutions.

## 2023-07-15 DIAGNOSIS — Q2112 Patent foramen ovale: Secondary | ICD-10-CM | POA: Diagnosis not present

## 2023-07-15 DIAGNOSIS — I2602 Saddle embolus of pulmonary artery with acute cor pulmonale: Secondary | ICD-10-CM | POA: Diagnosis not present

## 2023-07-15 DIAGNOSIS — I69359 Hemiplegia and hemiparesis following cerebral infarction affecting unspecified side: Secondary | ICD-10-CM | POA: Diagnosis not present

## 2023-07-18 ENCOUNTER — Encounter: Payer: Self-pay | Admitting: Family Medicine

## 2023-07-18 DIAGNOSIS — R531 Weakness: Secondary | ICD-10-CM | POA: Diagnosis not present

## 2023-07-19 DIAGNOSIS — M25552 Pain in left hip: Secondary | ICD-10-CM | POA: Diagnosis not present

## 2023-07-19 DIAGNOSIS — G8929 Other chronic pain: Secondary | ICD-10-CM | POA: Diagnosis not present

## 2023-07-19 DIAGNOSIS — R531 Weakness: Secondary | ICD-10-CM | POA: Diagnosis not present

## 2023-07-21 DIAGNOSIS — I639 Cerebral infarction, unspecified: Secondary | ICD-10-CM | POA: Diagnosis not present

## 2023-07-21 DIAGNOSIS — Z96642 Presence of left artificial hip joint: Secondary | ICD-10-CM | POA: Diagnosis not present

## 2023-07-21 DIAGNOSIS — M25552 Pain in left hip: Secondary | ICD-10-CM | POA: Diagnosis not present

## 2023-07-21 DIAGNOSIS — R531 Weakness: Secondary | ICD-10-CM | POA: Diagnosis not present

## 2023-07-22 DIAGNOSIS — E349 Endocrine disorder, unspecified: Secondary | ICD-10-CM | POA: Diagnosis not present

## 2023-07-22 DIAGNOSIS — I69359 Hemiplegia and hemiparesis following cerebral infarction affecting unspecified side: Secondary | ICD-10-CM | POA: Diagnosis not present

## 2023-07-22 DIAGNOSIS — R760 Raised antibody titer: Secondary | ICD-10-CM | POA: Diagnosis not present

## 2023-07-22 DIAGNOSIS — I2602 Saddle embolus of pulmonary artery with acute cor pulmonale: Secondary | ICD-10-CM | POA: Diagnosis not present

## 2023-07-22 DIAGNOSIS — Q2112 Patent foramen ovale: Secondary | ICD-10-CM | POA: Diagnosis not present

## 2023-07-22 NOTE — Telephone Encounter (Signed)
 Dr. Ashley Royalty,  Please see most recent mychart response sent by pt and advise.

## 2023-07-25 ENCOUNTER — Ambulatory Visit: Payer: BC Managed Care – PPO | Admitting: Family Medicine

## 2023-07-25 DIAGNOSIS — R531 Weakness: Secondary | ICD-10-CM | POA: Diagnosis not present

## 2023-07-28 ENCOUNTER — Encounter: Payer: Self-pay | Admitting: Family Medicine

## 2023-07-28 ENCOUNTER — Ambulatory Visit (INDEPENDENT_AMBULATORY_CARE_PROVIDER_SITE_OTHER): Admitting: Family Medicine

## 2023-07-28 VITALS — BP 111/72 | HR 91 | Ht 75.0 in | Wt 225.0 lb

## 2023-07-28 DIAGNOSIS — E782 Mixed hyperlipidemia: Secondary | ICD-10-CM

## 2023-07-28 DIAGNOSIS — Q2112 Patent foramen ovale: Secondary | ICD-10-CM | POA: Diagnosis not present

## 2023-07-28 DIAGNOSIS — I639 Cerebral infarction, unspecified: Secondary | ICD-10-CM | POA: Diagnosis not present

## 2023-07-28 DIAGNOSIS — I69359 Hemiplegia and hemiparesis following cerebral infarction affecting unspecified side: Secondary | ICD-10-CM | POA: Diagnosis not present

## 2023-07-28 DIAGNOSIS — G8929 Other chronic pain: Secondary | ICD-10-CM | POA: Diagnosis not present

## 2023-07-28 DIAGNOSIS — M25552 Pain in left hip: Secondary | ICD-10-CM | POA: Diagnosis not present

## 2023-07-28 DIAGNOSIS — E291 Testicular hypofunction: Secondary | ICD-10-CM | POA: Diagnosis not present

## 2023-07-28 DIAGNOSIS — I2602 Saddle embolus of pulmonary artery with acute cor pulmonale: Secondary | ICD-10-CM

## 2023-07-28 DIAGNOSIS — Z96642 Presence of left artificial hip joint: Secondary | ICD-10-CM | POA: Diagnosis not present

## 2023-07-28 DIAGNOSIS — R531 Weakness: Secondary | ICD-10-CM | POA: Diagnosis not present

## 2023-07-28 MED ORDER — HYDROXYZINE PAMOATE 50 MG PO CAPS: 50.0000 mg | ORAL_CAPSULE | Freq: Three times a day (TID) | ORAL | 2 refills | Status: DC | PRN

## 2023-07-28 NOTE — Assessment & Plan Note (Signed)
 Hypogonadism secondary to Klinefelter syndrome.  Advised to remain off of testosterone at this time as it is thought this may have contributed to DVT.

## 2023-07-28 NOTE — Assessment & Plan Note (Signed)
 Continue with Xarelto at current strength.  He is seeing heme-onc.  Additional hypercoagulability testing is to be completed per patient.

## 2023-07-28 NOTE — Progress Notes (Signed)
 [name] - 63 y.o. male MRN [id number]  Date of birth: 08-31-1960  Subjective Chief Complaint  Patient presents with   Follow-up    HPI [name] is a 63 y.o. male here today for follow-up visit.  He is continuing to do physical therapy as well as speech therapy.  He has had follow-up with neurology.  He reports that neurologist was concerned that he did not have carotid evaluation during his hospitalization.  No additional imaging was ordered at this appointment.   He does report that he is having headaches at times after speech therapy.  Has episodes where he "spaces out".  He does not recall these episodes.  He did mention these to his neurologist but reports that he did not think these were seizures.    He does continue on Xarelto.  Using Vistaril for itching associated with this.  This is better tolerated than eliquis.  Continues on Farxiga, glimepiride and Rybelsus for management of diabetes.  Blood sugars have been pretty well-controlled at home.  No side effects at this time.    ROS:  A comprehensive ROS was completed and negative except as noted per HPI   Past Medical History:  Diagnosis Date   Allergy    Cervical radiculopathy 08/09/2017   Diabetes mellitus without complication (HCC)    Stroke (cerebrum) (HCC) 08/03/2017   Type 2 diabetes mellitus with hyperlipidemia (HCC)     Past Surgical History:  Procedure Laterality Date   ESOPHAGOGASTRODUODENOSCOPY (EGD) WITH PROPOFOL N/A 05/09/2015   Procedure: ESOPHAGOGASTRODUODENOSCOPY (EGD) WITH PROPOFOL;  Surgeon: Iva Boop, MD;  Location: [facility];  Service: Endoscopy;  Laterality: N/A;  with esophageal dilation   LASER ABLATION Left 02/03/15   VARICOSE VEIN SURGERY      Social History   Socioeconomic History   Marital status: Married    Spouse name: Not on file   Number of children: Not on file   Years of education: Not on file   Highest education level: Not on file  Occupational History   Not on  file  Tobacco Use   Smoking status: Never   Smokeless tobacco: Never  Vaping Use   Vaping status: Never Used  Substance and Sexual Activity   Alcohol use: No    Alcohol/week: 0.0 standard drinks of alcohol   Drug use: No   Sexual activity: Never  Other Topics Concern   Not on file  Social History Narrative   Not on file   Social Drivers of Health   Financial Resource Strain: Low Risk  (06/09/2023)   Received [facility]   Overall Financial Resource Strain (CARDIA)    Difficulty of Paying Living Expenses: Not very hard  Food Insecurity: No Food Insecurity (06/15/2023)   Hunger Vital Sign    Worried About Running Out of Food in the Last Year: Never true    Ran Out of Food in the Last Year: Never true  Transportation Needs: No Transportation Needs (06/15/2023)   PRAPARE - Administrator, Civil Service (Medical): No    Lack of Transportation (Non-Medical): No  Physical Activity: Not on file  Stress: No Stress Concern Present (06/06/2023)   Received from [facility] of Occupational Health - Occupational Stress Questionnaire    Feeling of Stress : Not at all  Social Connections: Unknown (08/20/2021)   Received from [facility]   Social Network    Social Network: Not on file  Family History  Problem Relation Age of Onset   Parkinson's disease Father    Heart attack Maternal Grandmother    Diabetes Neg Hx     Health Maintenance  Topic Date Due   Zoster Vaccines- Shingrix (1 of 2) Never done   Pneumococcal Vaccine 75-15 Years old (2 of 2 - PCV) 04/07/2019   OPHTHALMOLOGY EXAM  05/19/2023   HEMOGLOBIN A1C  07/25/2023   Hepatitis C Screening  01/24/2024 (Originally 01/13/1979)   Diabetic kidney evaluation - eGFR measurement  09/24/2023   Diabetic kidney evaluation - Urine ACR  09/24/2023   FOOT EXAM  01/24/2024   Colonoscopy  09/03/2028   DTaP/Tdap/Td (4 - Td or Tdap) 01/30/2031   HIV Screening  Completed   HPV  VACCINES  Aged Out   Meningococcal B Vaccine  Aged Out   INFLUENZA VACCINE  Discontinued   COVID-19 Vaccine  Discontinued     ----------------------------------------------------------------------------------------------------------------------------------------------------------------------------------------------------------------- Physical Exam BP 111/72 (BP Location: Left Arm, Patient Position: Sitting, Cuff Size: Normal)   Pulse 91   Ht 6\' 3"  (1.905 m)   Wt 225 lb (102.1 kg)   SpO2 99%   BMI 28.12 kg/m   Physical Exam Constitutional:      Appearance: Normal appearance.  HENT:     Head: Normocephalic and atraumatic.  Eyes:     General: No scleral icterus. Cardiovascular:     Rate and Rhythm: Normal rate and regular rhythm.  Pulmonary:     Effort: Pulmonary effort is normal.     Breath sounds: Normal breath sounds.  Neurological:     General: No focal deficit present.     Mental Status: He is alert.  Psychiatric:        Mood and Affect: Mood normal.        Behavior: Behavior normal.     ------------------------------------------------------------------------------------------------------------------------------------------------------------------------------------------------------------------- Assessment and Plan  Cardioembolic stroke, old, with hemiparesis (HCC) Continue aspirin along with Xarelto.  PFO noted on TEE.  Plan is to repair this at some point.  He will continue to follow with cardiology and neurology.  Will order carotid ultrasound as no carotid evaluation was completed during his hospitalization.  Hypogonadism in male Hypogonadism secondary to Klinefelter syndrome.  Advised to remain off of testosterone at this time as it is thought this may have contributed to DVT.  Hyperlipidemia He did not tolerate statin well.  Recommend continuation of Repatha.  Saddle embolus of pulmonary artery with acute cor pulmonale (HCC) Continue with Xarelto at current  strength.  He is seeing heme-onc.  Additional hypercoagulability testing is to be completed per patient.   Meds ordered this encounter  Medications   hydrOXYzine (VISTARIL) 50 MG capsule    Sig: Take 1 capsule (50 mg total) by mouth 3 (three) times daily as needed for itching.    Dispense:  60 capsule    Refill:  2    Return in about 3 months (around 10/27/2023) for F/u DVT/CVA/T2DM.

## 2023-07-28 NOTE — Assessment & Plan Note (Signed)
 He did not tolerate statin well.  Recommend continuation of Repatha.

## 2023-07-28 NOTE — Assessment & Plan Note (Signed)
 Continue aspirin along with Xarelto.  PFO noted on TEE.  Plan is to repair this at some point.  He will continue to follow with cardiology and neurology.  Will order carotid ultrasound as no carotid evaluation was completed during his hospitalization.

## 2023-08-02 DIAGNOSIS — R531 Weakness: Secondary | ICD-10-CM | POA: Diagnosis not present

## 2023-08-02 DIAGNOSIS — M25552 Pain in left hip: Secondary | ICD-10-CM | POA: Diagnosis not present

## 2023-08-04 DIAGNOSIS — M25552 Pain in left hip: Secondary | ICD-10-CM | POA: Diagnosis not present

## 2023-08-04 DIAGNOSIS — I82432 Acute embolism and thrombosis of left popliteal vein: Secondary | ICD-10-CM | POA: Diagnosis not present

## 2023-08-04 DIAGNOSIS — Z96642 Presence of left artificial hip joint: Secondary | ICD-10-CM | POA: Diagnosis not present

## 2023-08-04 DIAGNOSIS — Z8673 Personal history of transient ischemic attack (TIA), and cerebral infarction without residual deficits: Secondary | ICD-10-CM | POA: Diagnosis not present

## 2023-08-04 DIAGNOSIS — I2609 Other pulmonary embolism with acute cor pulmonale: Secondary | ICD-10-CM | POA: Diagnosis not present

## 2023-08-04 DIAGNOSIS — I639 Cerebral infarction, unspecified: Secondary | ICD-10-CM | POA: Diagnosis not present

## 2023-08-04 DIAGNOSIS — R531 Weakness: Secondary | ICD-10-CM | POA: Diagnosis not present

## 2023-08-09 DIAGNOSIS — R531 Weakness: Secondary | ICD-10-CM | POA: Diagnosis not present

## 2023-08-09 DIAGNOSIS — M25552 Pain in left hip: Secondary | ICD-10-CM | POA: Diagnosis not present

## 2023-08-11 DIAGNOSIS — Z96642 Presence of left artificial hip joint: Secondary | ICD-10-CM | POA: Diagnosis not present

## 2023-08-11 DIAGNOSIS — R531 Weakness: Secondary | ICD-10-CM | POA: Diagnosis not present

## 2023-08-11 DIAGNOSIS — M25552 Pain in left hip: Secondary | ICD-10-CM | POA: Diagnosis not present

## 2023-08-11 DIAGNOSIS — I639 Cerebral infarction, unspecified: Secondary | ICD-10-CM | POA: Diagnosis not present

## 2023-08-16 DIAGNOSIS — M25552 Pain in left hip: Secondary | ICD-10-CM | POA: Diagnosis not present

## 2023-08-16 DIAGNOSIS — R531 Weakness: Secondary | ICD-10-CM | POA: Diagnosis not present

## 2023-08-17 ENCOUNTER — Telehealth: Payer: Self-pay

## 2023-08-17 NOTE — Telephone Encounter (Signed)
 Copied from CRM [id number]. Topic: Clinical - Request for Lab/Test Order >> Aug 17, 2023 11:25 AM Tisa Forester wrote: Reason for CRM: patient when get off the heart monitor  this friday 08/19/23 morning patient request when get the ultrsound for on Carotid  if check his lungs also the same time  Please call patient at [phone] or can reach spouse at [phone] >> Aug 17, 2023 11:39 AM Corin V wrote: Patient called back to clarify he spoke to imaging and he has a CT and Ultrasound order. He is scheduled to have the ultrasound and wants to know if the CT is still needed and if he can have that done at the same time as the Ultrasound.

## 2023-08-18 ENCOUNTER — Telehealth: Payer: Self-pay

## 2023-08-18 NOTE — Telephone Encounter (Signed)
 Copied from CRM [id number]. Topic: Clinical - Request for Lab/Test Order >> Aug 18, 2023  8:39 AM Kevelyn M wrote: Reason for CRM: patient is requesting to obtain and order to get a CT scan of his heart and lungs to see if the blood thinners are working. He already has an ultrasound tomorrow at 2pm and wants to see he can get it done at the same time. Please call patient.

## 2023-08-19 ENCOUNTER — Other Ambulatory Visit

## 2023-08-19 ENCOUNTER — Ambulatory Visit: Payer: Self-pay | Admitting: *Deleted

## 2023-08-19 NOTE — Telephone Encounter (Signed)
  Chief Complaint: headache Symptoms: headache comes and goes- medication patient has been given for this makes him dizzy- wants to try something else. Headache today- forehead to top- rates 7/10 Frequency: comes and goes Pertinent Negatives: Patient denies fever, stiff neck, eye pain, sore throat, cold symptoms  Disposition: [] ED /[] Urgent Care (no appt availability in office) / [] Appointment(In office/virtual)/ []  [facility]/ [] Home Care/ [x] Refused Recommended Disposition /[] [facility]/ []  Follow-up with PCP Additional Notes: Patient only wants to see PCP- no open appointment today- patient has been scheduled for Monday. Patient advised if he has additonal symptoms- gets worse in any way- UC/ED. Patient would like to be worked in today   Copied from [employer] [id number]. Topic: Clinical - Red Word Triage >> Aug 19, 2023 10:03 AM Adrianna P wrote: Red Word that prompted transfer to Nurse Triage: Patient headaches has been getting worse. Patient headache is currently an 7/10 Reason for Disposition  [1] MODERATE headache (e.g., interferes with normal activities) AND [2] present > 24 hours AND [3] unexplained  (Exceptions: analgesics not tried, typical migraine, or headache part of viral illness)  Answer Assessment - Initial Assessment Questions 1. LOCATION: "Where does it hurt?"      Front to top 2. ONSET: "When did the headache start?" (Minutes, hours or days)      Off on since stroke 3. PATTERN: "Does the pain come and go, or has it been constant since it started?"     Comes and goes 4. SEVERITY: "How bad is the pain?" and "What does it keep you from doing?"  (e.g., Scale 1-10; mild, moderate, or severe)   - MILD (1-3): doesn't interfere with normal activities    - MODERATE (4-7): interferes with normal activities or awakens from sleep    - SEVERE (8-10): excruciating pain, unable to do any normal activities        7/10 5. RECURRENT SYMPTOM: "Have you ever had headaches  before?" If Yes, ask: "When was the last time?" and "What happened that time?"      yes 6. CAUSE: "What do you think is causing the headache?"     Not sure  9. OTHER SYMPTOMS: "Do you have any other symptoms?" (fever, stiff neck, eye pain, sore throat, cold symptoms)     dizziness  Protocols used: Headache-A-AH

## 2023-08-22 ENCOUNTER — Ambulatory Visit: Admitting: Family Medicine

## 2023-08-22 ENCOUNTER — Encounter: Payer: Self-pay | Admitting: Family Medicine

## 2023-08-22 VITALS — BP 105/71 | HR 88 | Ht 75.0 in | Wt 224.0 lb

## 2023-08-22 DIAGNOSIS — R519 Headache, unspecified: Secondary | ICD-10-CM

## 2023-08-22 DIAGNOSIS — R531 Weakness: Secondary | ICD-10-CM | POA: Diagnosis not present

## 2023-08-22 NOTE — Progress Notes (Signed)
 [name] - 63 y.o. male MRN [id number]  Date of birth: 05-Oct-1960  Subjective Chief Complaint  Patient presents with   Headache   Dizziness   desaturation    HPI  [name] is a 63 year old male here today for follow-up.  Reports that he continues to have episodic headaches.  These typically occur with therapy sessions including OT, PT and speech therapy.  Pain is in the back of the neck with radiation into the part of the head.  They also note that when he has these episodes he has focal discoloration to the side of the neck.  Neck is purple in coloration when this occurs.  He does have an upcoming carotid ultrasound.  He remains on anticoagulation for history of PE.  He is requesting to have updated scan of his chest.  He denies chest pain, increased shortness of breath or wheezing.  ROS:  A comprehensive ROS was completed and negative except as noted per HPI  Allergies  Allergen Reactions   Apixaban Itching   Acetaminophen  Other (See Comments)    jittery   Rosuvastatin  Anxiety    Made him feel jittery   Wound Dressing Adhesive Rash   Latex Rash    Past Medical History:  Diagnosis Date   Allergy    Cervical radiculopathy 08/09/2017   Diabetes mellitus without complication (HCC)    Stroke (cerebrum) (HCC) 08/03/2017   Type 2 diabetes mellitus with hyperlipidemia (HCC)     Past Surgical History:  Procedure Laterality Date   ESOPHAGOGASTRODUODENOSCOPY (EGD) WITH PROPOFOL  N/A 05/09/2015   Procedure: ESOPHAGOGASTRODUODENOSCOPY (EGD) WITH PROPOFOL ;  Surgeon: Kenney Peacemaker, MD;  Location: [facility];  Service: Endoscopy;  Laterality: N/A;  with esophageal dilation   LASER ABLATION Left 02/03/15   VARICOSE VEIN SURGERY      Social History   Socioeconomic History   Marital status: Married    Spouse name: Not on file   Number of children: Not on file   Years of education: Not on file   Highest education level: Not on file  Occupational History   Not on  file  Tobacco Use   Smoking status: Never   Smokeless tobacco: Never  Vaping Use   Vaping status: Never Used  Substance and Sexual Activity   Alcohol use: No    Alcohol/week: 0.0 standard drinks of alcohol   Drug use: No   Sexual activity: Never  Other Topics Concern   Not on file  Social History Narrative   Not on file   Social Drivers of Health   Financial Resource Strain: Low Risk  (06/09/2023)   Received [facility]   Overall Financial Resource Strain (CARDIA)    Difficulty of Paying Living Expenses: Not very hard  Food Insecurity: No Food Insecurity (06/15/2023)   Hunger Vital Sign    Worried About Running Out of Food in the Last Year: Never true    Ran Out of Food in the Last Year: Never true  Transportation Needs: No Transportation Needs (06/15/2023)   PRAPARE - Administrator, Civil Service (Medical): No    Lack of Transportation (Non-Medical): No  Physical Activity: Not on file  Stress: No Stress Concern Present (06/06/2023)   Received from [facility] of Occupational Health - Occupational Stress Questionnaire    Feeling of Stress : Not at all  Social Connections: Unknown (08/20/2021)   Received from [facility]   Social Network  Social Network: Not on file    Family History  Problem Relation Age of Onset   Parkinson's disease Father    Heart attack Maternal Grandmother    Diabetes Neg Hx     Health Maintenance  Topic Date Due   Zoster Vaccines- Shingrix (1 of 2) Never done   Pneumococcal Vaccine 14-42 Years old (2 of 2 - PCV) 04/07/2019   OPHTHALMOLOGY EXAM  05/19/2023   HEMOGLOBIN A1C  07/25/2023   Hepatitis C Screening  01/24/2024 (Originally 01/13/1979)   Diabetic kidney evaluation - eGFR measurement  09/24/2023   Diabetic kidney evaluation - Urine ACR  09/24/2023   FOOT EXAM  01/24/2024   Colonoscopy  09/03/2028   DTaP/Tdap/Td (4 - Td or Tdap) 01/30/2031   HIV Screening  Completed   HPV  VACCINES  Aged Out   Meningococcal B Vaccine  Aged Out   INFLUENZA VACCINE  Discontinued   COVID-19 Vaccine  Discontinued     ----------------------------------------------------------------------------------------------------------------------------------------------------------------------------------------------------------------- Physical Exam BP 105/71 (BP Location: Left Arm, Patient Position: Sitting)   Pulse 88   Ht 6\' 3"  (1.905 m)   Wt 224 lb (101.6 kg)   SpO2 96%   BMI 28.00 kg/m   Physical Exam Constitutional:      Appearance: He is well-developed.  Cardiovascular:     Rate and Rhythm: Normal rate and regular rhythm.  Pulmonary:     Effort: Pulmonary effort is normal.     Breath sounds: Normal breath sounds.  Neurological:     Mental Status: He is alert.  Psychiatric:        Mood and Affect: Mood normal.        Behavior: Behavior normal.     ------------------------------------------------------------------------------------------------------------------------------------------------------------------------------------------------------------------- Assessment and Plan  Increased frequency of headaches Unclear etiology.  May be related to his recent CVA.  Notes some discoloration around his neck during therapy at times.  Carotid ultrasound pending.  He will have this later this week.  Will can consider trial of topiramate or similar preventative if headaches persist.   No orders of the defined types were placed in this encounter.   No follow-ups on file.

## 2023-08-22 NOTE — Assessment & Plan Note (Addendum)
 Unclear etiology.  May be related to his recent CVA.  Notes some discoloration around his neck during therapy at times.  Carotid ultrasound pending.  He will have this later this week.  Will can consider trial of topiramate or similar preventative if headaches persist.

## 2023-08-25 DIAGNOSIS — R531 Weakness: Secondary | ICD-10-CM | POA: Diagnosis not present

## 2023-08-25 DIAGNOSIS — I639 Cerebral infarction, unspecified: Secondary | ICD-10-CM | POA: Diagnosis not present

## 2023-08-25 DIAGNOSIS — R262 Difficulty in walking, not elsewhere classified: Secondary | ICD-10-CM | POA: Diagnosis not present

## 2023-08-25 DIAGNOSIS — Z96642 Presence of left artificial hip joint: Secondary | ICD-10-CM | POA: Diagnosis not present

## 2023-08-25 DIAGNOSIS — M25552 Pain in left hip: Secondary | ICD-10-CM | POA: Diagnosis not present

## 2023-08-26 ENCOUNTER — Ambulatory Visit

## 2023-08-26 ENCOUNTER — Other Ambulatory Visit: Payer: Self-pay | Admitting: Family Medicine

## 2023-08-26 ENCOUNTER — Encounter: Payer: Self-pay | Admitting: Family Medicine

## 2023-08-26 DIAGNOSIS — Z8673 Personal history of transient ischemic attack (TIA), and cerebral infarction without residual deficits: Secondary | ICD-10-CM | POA: Diagnosis not present

## 2023-08-26 DIAGNOSIS — E041 Nontoxic single thyroid nodule: Secondary | ICD-10-CM

## 2023-08-26 DIAGNOSIS — I639 Cerebral infarction, unspecified: Secondary | ICD-10-CM

## 2023-08-30 ENCOUNTER — Ambulatory Visit

## 2023-08-30 DIAGNOSIS — E042 Nontoxic multinodular goiter: Secondary | ICD-10-CM | POA: Diagnosis not present

## 2023-08-30 DIAGNOSIS — E041 Nontoxic single thyroid nodule: Secondary | ICD-10-CM

## 2023-08-31 DIAGNOSIS — R531 Weakness: Secondary | ICD-10-CM | POA: Diagnosis not present

## 2023-08-31 DIAGNOSIS — Z96642 Presence of left artificial hip joint: Secondary | ICD-10-CM | POA: Diagnosis not present

## 2023-08-31 DIAGNOSIS — R262 Difficulty in walking, not elsewhere classified: Secondary | ICD-10-CM | POA: Diagnosis not present

## 2023-09-01 DIAGNOSIS — Z96642 Presence of left artificial hip joint: Secondary | ICD-10-CM | POA: Diagnosis not present

## 2023-09-01 DIAGNOSIS — R531 Weakness: Secondary | ICD-10-CM | POA: Diagnosis not present

## 2023-09-01 DIAGNOSIS — M25552 Pain in left hip: Secondary | ICD-10-CM | POA: Diagnosis not present

## 2023-09-01 DIAGNOSIS — I639 Cerebral infarction, unspecified: Secondary | ICD-10-CM | POA: Diagnosis not present

## 2023-09-02 DIAGNOSIS — R262 Difficulty in walking, not elsewhere classified: Secondary | ICD-10-CM | POA: Diagnosis not present

## 2023-09-04 ENCOUNTER — Other Ambulatory Visit: Payer: Self-pay | Admitting: Family Medicine

## 2023-09-06 DIAGNOSIS — R262 Difficulty in walking, not elsewhere classified: Secondary | ICD-10-CM | POA: Diagnosis not present

## 2023-09-08 DIAGNOSIS — Z96642 Presence of left artificial hip joint: Secondary | ICD-10-CM | POA: Diagnosis not present

## 2023-09-08 DIAGNOSIS — R262 Difficulty in walking, not elsewhere classified: Secondary | ICD-10-CM | POA: Diagnosis not present

## 2023-09-08 DIAGNOSIS — I639 Cerebral infarction, unspecified: Secondary | ICD-10-CM | POA: Diagnosis not present

## 2023-09-08 DIAGNOSIS — M25552 Pain in left hip: Secondary | ICD-10-CM | POA: Diagnosis not present

## 2023-09-08 DIAGNOSIS — R531 Weakness: Secondary | ICD-10-CM | POA: Diagnosis not present

## 2023-09-21 ENCOUNTER — Other Ambulatory Visit: Payer: Self-pay | Admitting: Family Medicine

## 2023-09-23 ENCOUNTER — Telehealth: Payer: Self-pay

## 2023-09-23 DIAGNOSIS — R0600 Dyspnea, unspecified: Secondary | ICD-10-CM | POA: Diagnosis not present

## 2023-09-23 DIAGNOSIS — Q2112 Patent foramen ovale: Secondary | ICD-10-CM | POA: Diagnosis not present

## 2023-09-23 DIAGNOSIS — I69359 Hemiplegia and hemiparesis following cerebral infarction affecting unspecified side: Secondary | ICD-10-CM | POA: Diagnosis not present

## 2023-09-23 DIAGNOSIS — I2699 Other pulmonary embolism without acute cor pulmonale: Secondary | ICD-10-CM

## 2023-09-23 DIAGNOSIS — E349 Endocrine disorder, unspecified: Secondary | ICD-10-CM | POA: Diagnosis not present

## 2023-09-23 DIAGNOSIS — I2602 Saddle embolus of pulmonary artery with acute cor pulmonale: Secondary | ICD-10-CM | POA: Diagnosis not present

## 2023-09-23 NOTE — Telephone Encounter (Signed)
 Left message for a return call

## 2023-09-23 NOTE — Telephone Encounter (Signed)
 Copied from CRM [id number]. Topic: General - Other >> Sep 23, 2023  9:46 AM Adrianna P wrote: Reason for CRM: Patient would like Dr. Augustus Ledger to go ahead and order his ct scan on his lungs

## 2023-09-23 NOTE — Telephone Encounter (Signed)
 Copied from CRM [id number]. Topic: General - Other >> Sep 23, 2023  9:46 AM Adrianna P wrote: Reason for CRM: Patient would like Dr. Augustus Ledger to go ahead and order his ct scan on his lungs >> Sep 23, 2023 10:11 AM Retta Caster wrote: Patient request call back on update for CT order. [phone]

## 2023-09-23 NOTE — Telephone Encounter (Signed)
 [name] is requesting a CTA to see if his blood clot has resolved. He states Dr Augustus Ledger stated he would order the CTA.

## 2023-09-23 NOTE — Telephone Encounter (Signed)
 Copied from CRM [id number]. Topic: General - Other >> Sep 23, 2023  9:46 AM [name] wrote: Reason for CRM: Patient would like Dr. Augustus Ledger to go ahead and order his ct scan on his lungs

## 2023-09-29 DIAGNOSIS — I2693 Single subsegmental pulmonary embolism without acute cor pulmonale: Secondary | ICD-10-CM | POA: Diagnosis not present

## 2023-09-29 DIAGNOSIS — I82432 Acute embolism and thrombosis of left popliteal vein: Secondary | ICD-10-CM | POA: Diagnosis not present

## 2023-09-29 DIAGNOSIS — R41841 Cognitive communication deficit: Secondary | ICD-10-CM | POA: Diagnosis not present

## 2023-09-29 DIAGNOSIS — I2602 Saddle embolus of pulmonary artery with acute cor pulmonale: Secondary | ICD-10-CM | POA: Diagnosis not present

## 2023-09-29 DIAGNOSIS — Z8673 Personal history of transient ischemic attack (TIA), and cerebral infarction without residual deficits: Secondary | ICD-10-CM | POA: Diagnosis not present

## 2023-09-29 DIAGNOSIS — I2609 Other pulmonary embolism with acute cor pulmonale: Secondary | ICD-10-CM | POA: Diagnosis not present

## 2023-09-29 DIAGNOSIS — Z96642 Presence of left artificial hip joint: Secondary | ICD-10-CM | POA: Diagnosis not present

## 2023-10-04 NOTE — Addendum Note (Signed)
 Addended by: Annakate Soulier E on: 10/04/2023 12:13 PM   Modules accepted: Orders

## 2023-10-04 NOTE — Addendum Note (Signed)
 Addended by: Abundio Hoit on: 10/04/2023 12:51 PM   Modules accepted: Orders

## 2023-10-04 NOTE — Addendum Note (Signed)
 Addended by: Abundio Hoit on: 10/04/2023 12:08 PM   Modules accepted: Orders

## 2023-10-05 ENCOUNTER — Ambulatory Visit (INDEPENDENT_AMBULATORY_CARE_PROVIDER_SITE_OTHER): Admitting: Family Medicine

## 2023-10-05 ENCOUNTER — Encounter: Payer: Self-pay | Admitting: Family Medicine

## 2023-10-05 VITALS — BP 106/67 | HR 88 | Ht 75.0 in | Wt 227.0 lb

## 2023-10-05 DIAGNOSIS — I69359 Hemiplegia and hemiparesis following cerebral infarction affecting unspecified side: Secondary | ICD-10-CM

## 2023-10-05 DIAGNOSIS — I2602 Saddle embolus of pulmonary artery with acute cor pulmonale: Secondary | ICD-10-CM | POA: Diagnosis not present

## 2023-10-05 DIAGNOSIS — Q2112 Patent foramen ovale: Secondary | ICD-10-CM | POA: Diagnosis not present

## 2023-10-05 DIAGNOSIS — H9193 Unspecified hearing loss, bilateral: Secondary | ICD-10-CM | POA: Diagnosis not present

## 2023-10-09 DIAGNOSIS — H9193 Unspecified hearing loss, bilateral: Secondary | ICD-10-CM | POA: Insufficient documentation

## 2023-10-09 NOTE — Progress Notes (Signed)
 VARUN Price - 63 y.o. male MRN [id number]  Date of birth: 1960-06-27  Subjective Chief Complaint  Patient presents with   Results    HPI [name] is a 63 y.o. male here today for follow up visit.   History of saddle PE after having hip arthroplasty.  He has been on Xarelto for treatment of this.  He did have recent CT angio of the chest which showed recannulation of previous clot as well as no new clots.  He denies new or worsening chest pain.  He does have PFO.  Cardiology is planning on doing PFO closure due to previous stroke and high risk for second stroke.  He has put this off for now.  Plan was the range of over to heparin while off of Xarelto.  He has also had some recent hearing loss.  This started suddenly a few days ago.  Denies ear pain or pressure.  ROS:  A comprehensive ROS was completed and negative except as noted per HPI  Allergies  Allergen Reactions   Apixaban Itching   Acetaminophen  Other (See Comments)    jittery   Rosuvastatin  Anxiety    Made him feel jittery   Wound Dressing Adhesive Rash   Latex Rash    Past Medical History:  Diagnosis Date   Allergy    Cervical radiculopathy 08/09/2017   Diabetes mellitus without complication (HCC)    Stroke (cerebrum) (HCC) 08/03/2017   Type 2 diabetes mellitus with hyperlipidemia (HCC)     Past Surgical History:  Procedure Laterality Date   ESOPHAGOGASTRODUODENOSCOPY (EGD) WITH PROPOFOL  N/A 05/09/2015   Procedure: ESOPHAGOGASTRODUODENOSCOPY (EGD) WITH PROPOFOL ;  Surgeon: Lupita FORBES Commander, MD;  Location: [facility];  Service: Endoscopy;  Laterality: N/A;  with esophageal dilation   LASER ABLATION Left 02/03/15   VARICOSE VEIN SURGERY      Social History   Socioeconomic History   Marital status: Married    Spouse name: Not on file   Number of children: Not on file   Years of education: Not on file   Highest education level: Not on file  Occupational History   Not on file  Tobacco Use    Smoking status: Never   Smokeless tobacco: Never  Vaping Use   Vaping status: Never Used  Substance and Sexual Activity   Alcohol use: No    Alcohol/week: 0.0 standard drinks of alcohol   Drug use: No   Sexual activity: Never  Other Topics Concern   Not on file  Social History Narrative   Not on file   Social Drivers of Health   Financial Resource Strain: Low Risk  (06/09/2023)   Received from [facility]   Overall Financial Resource Strain (CARDIA)    Difficulty of Paying Living Expenses: Not very hard  Food Insecurity: No Food Insecurity (06/15/2023)   Hunger Vital Sign    Worried About Running Out of Food in the Last Year: Never true    Ran Out of Food in the Last Year: Never true  Transportation Needs: No Transportation Needs (06/15/2023)   PRAPARE - Administrator, Civil Service (Medical): No    Lack of Transportation (Non-Medical): No  Physical Activity: Not on file  Stress: No Stress Concern Present (06/06/2023)   Received from [facility] - Occupational Stress Questionnaire    Feeling of Stress : Not at all  Social Connections: Unknown (08/20/2021)   Received from [facility]   Social  Network    Social Network: Not on file    Family History  Problem Relation Age of Onset   Parkinson's disease Father    Heart attack Maternal Grandmother    Diabetes Neg Hx     Health Maintenance  Topic Date Due   Zoster Vaccines- Shingrix (1 of 2) Never done   Pneumococcal Vaccine 66-77 Years old (2 of 2 - PCV) 04/07/2019   OPHTHALMOLOGY EXAM  05/19/2023   HEMOGLOBIN A1C  07/25/2023   Diabetic kidney evaluation - eGFR measurement  09/24/2023   Diabetic kidney evaluation - Urine ACR  09/24/2023   Hepatitis C Screening  01/24/2024 (Originally 01/13/1979)   FOOT EXAM  01/24/2024   Colonoscopy  09/03/2028   DTaP/Tdap/Td (4 - Td or Tdap) 01/30/2031   HIV Screening  Completed   HPV VACCINES  Aged Out   Meningococcal B  Vaccine  Aged Out   INFLUENZA VACCINE  Discontinued   COVID-19 Vaccine  Discontinued     ----------------------------------------------------------------------------------------------------------------------------------------------------------------------------------------------------------------- Physical Exam BP 106/67 (BP Location: Left Arm, Patient Position: Sitting, Cuff Size: Large)   Pulse 88   Ht 6' 3 (1.905 m)   Wt 227 lb (103 kg)   SpO2 97%   BMI 28.37 kg/m   Physical Exam Constitutional:      Appearance: Normal appearance.  HENT:     Head: Normocephalic.     Right Ear: Tympanic membrane and ear canal normal.     Left Ear: Tympanic membrane and ear canal normal.   Cardiovascular:     Rate and Rhythm: Normal rate and regular rhythm.  Pulmonary:     Effort: Pulmonary effort is normal.     Breath sounds: Normal breath sounds.   Musculoskeletal:     Cervical back: Neck supple.   Neurological:     General: No focal deficit present.     Mental Status: He is alert.   Psychiatric:        Mood and Affect: Mood normal.        Behavior: Behavior normal.     ------------------------------------------------------------------------------------------------------------------------------------------------------------------------------------------------------------------- Assessment and Plan  Cardioembolic stroke, old, with hemiparesis (HCC) History of cardioembolic stroke.  He does have PFO and was scheduled to have closure but has put this off.  Recommend that he reschedule this and have this completed.  I recommended he remain on Xarelto until this is completed.  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  Recent CT scan did show recannulation of the right artery with no acute PE.  Hearing difficulty, bilateral Normal exam.  Referral placed to ENT.   No orders of the defined types were placed in this  encounter.   No follow-ups on file.

## 2023-10-09 NOTE — Assessment & Plan Note (Signed)
 Normal exam.  Referral placed to ENT.

## 2023-10-09 NOTE — Assessment & Plan Note (Signed)
 History of cardioembolic stroke.  He does have PFO and was scheduled to have closure but has put this off.  Recommend that he reschedule this and have this completed.  I recommended he remain on Xarelto until this is completed.

## 2023-10-09 NOTE — Assessment & Plan Note (Signed)
 He has been followed by hematology.  Recent CT scan did show recannulation of the right artery with no acute PE.

## 2023-10-12 DIAGNOSIS — E291 Testicular hypofunction: Secondary | ICD-10-CM | POA: Diagnosis not present

## 2023-10-12 DIAGNOSIS — I2602 Saddle embolus of pulmonary artery with acute cor pulmonale: Secondary | ICD-10-CM | POA: Diagnosis not present

## 2023-10-12 DIAGNOSIS — R079 Chest pain, unspecified: Secondary | ICD-10-CM | POA: Diagnosis not present

## 2023-10-12 DIAGNOSIS — Q2112 Patent foramen ovale: Secondary | ICD-10-CM | POA: Diagnosis not present

## 2023-10-12 DIAGNOSIS — I69359 Hemiplegia and hemiparesis following cerebral infarction affecting unspecified side: Secondary | ICD-10-CM | POA: Diagnosis not present

## 2023-10-19 DIAGNOSIS — E291 Testicular hypofunction: Secondary | ICD-10-CM | POA: Diagnosis not present

## 2023-10-19 DIAGNOSIS — N5201 Erectile dysfunction due to arterial insufficiency: Secondary | ICD-10-CM | POA: Diagnosis not present

## 2023-10-20 DIAGNOSIS — R41841 Cognitive communication deficit: Secondary | ICD-10-CM | POA: Diagnosis not present

## 2023-10-27 DIAGNOSIS — R41841 Cognitive communication deficit: Secondary | ICD-10-CM | POA: Diagnosis not present

## 2023-11-03 DIAGNOSIS — R41841 Cognitive communication deficit: Secondary | ICD-10-CM | POA: Diagnosis not present

## 2023-11-04 ENCOUNTER — Ambulatory Visit (INDEPENDENT_AMBULATORY_CARE_PROVIDER_SITE_OTHER): Admitting: Family Medicine

## 2023-11-04 VITALS — BP 105/65 | HR 80 | Ht 75.0 in | Wt 224.0 lb

## 2023-11-04 DIAGNOSIS — I69359 Hemiplegia and hemiparesis following cerebral infarction affecting unspecified side: Secondary | ICD-10-CM | POA: Diagnosis not present

## 2023-11-04 DIAGNOSIS — Q984 Klinefelter syndrome, unspecified: Secondary | ICD-10-CM

## 2023-11-04 DIAGNOSIS — Z7984 Long term (current) use of oral hypoglycemic drugs: Secondary | ICD-10-CM

## 2023-11-04 DIAGNOSIS — E785 Hyperlipidemia, unspecified: Secondary | ICD-10-CM

## 2023-11-04 DIAGNOSIS — E1169 Type 2 diabetes mellitus with other specified complication: Secondary | ICD-10-CM

## 2023-11-04 DIAGNOSIS — Z789 Other specified health status: Secondary | ICD-10-CM

## 2023-11-04 DIAGNOSIS — Q2112 Patent foramen ovale: Secondary | ICD-10-CM

## 2023-11-04 LAB — POCT GLYCOSYLATED HEMOGLOBIN (HGB A1C): HbA1c, POC (controlled diabetic range): 7.1 % — AB (ref 0.0–7.0)

## 2023-11-06 ENCOUNTER — Encounter: Payer: Self-pay | Admitting: Family Medicine

## 2023-11-06 NOTE — Progress Notes (Signed)
 [name] - 63 y.o. male MRN [id number]  Date of birth: 1960/05/02  Subjective Chief Complaint  Patient presents with   Hypertension   Toe Injury    HPI [name] is a 63 year old male here today for follow-up visit.  Reports he is doing okay.  He does have upcoming surgery for repair of PFO.  Fairly stable since stroke.  Overall he does not have any significant residual symptoms.  He remains on Xarelto and aspirin .  His injectable testosterone  has been switched to Jatenzo by endocrinology due to elevated H&H.  A1c has increased some since last visit, currently at 7.1%.[name]  He admits to not being as active since having hip surgery.  Intolerant to statins in the past and is using Repatha.  He is tolerating this well at current strength.  ROS:  A comprehensive ROS was completed and negative except as noted per HPI  Allergies  Allergen Reactions   Apixaban Itching   Acetaminophen  Other (See Comments)    jittery   Rosuvastatin  Anxiety    Made him feel jittery   Wound Dressing Adhesive Rash   Latex Rash    Past Medical History:  Diagnosis Date   Allergy    Cervical radiculopathy 08/09/2017   Diabetes mellitus without complication (HCC)    Stroke (cerebrum) (HCC) 08/03/2017   Type 2 diabetes mellitus with hyperlipidemia (HCC)     Past Surgical History:  Procedure Laterality Date   ESOPHAGOGASTRODUODENOSCOPY (EGD) WITH PROPOFOL  N/A 05/09/2015   Procedure: ESOPHAGOGASTRODUODENOSCOPY (EGD) WITH PROPOFOL ;  Surgeon: Lupita FORBES Commander, MD;  Location: [facility];  Service: Endoscopy;  Laterality: N/A;  with esophageal dilation   LASER ABLATION Left 02/03/15   VARICOSE VEIN SURGERY      Social History   Socioeconomic History   Marital status: Married    Spouse name: Not on file   Number of children: Not on file   Years of education: Not on file   Highest education level: Not on file  Occupational History   Not on file  Tobacco Use   Smoking status: Never    Smokeless tobacco: Never  Vaping Use   Vaping status: Never Used  Substance and Sexual Activity   Alcohol use: No    Alcohol/week: 0.0 standard drinks of alcohol   Drug use: No   Sexual activity: Never  Other Topics Concern   Not on file  Social History Narrative   Not on file   Social Drivers of Health   Financial Resource Strain: Low Risk  (06/09/2023)   Received [facility]   Overall Financial Resource Strain (CARDIA)    Difficulty of Paying Living Expenses: Not very hard  Food Insecurity: No Food Insecurity (06/15/2023)   Hunger Vital Sign    Worried About Running Out of Food in the Last Year: Never true    Ran Out of Food in the Last Year: Never true  Transportation Needs: No Transportation Needs (06/15/2023)   PRAPARE - Administrator, Civil Service (Medical): No    Lack of Transportation (Non-Medical): No  Physical Activity: Not on file  Stress: No Stress Concern Present (06/06/2023)   Received from [facility] Occupational [facility] - Occupational Stress Questionnaire    Feeling of Stress : Not at all  Social Connections: Unknown (08/20/2021)   Received from [facility]   Social Network    Social Network: Not on file    Family History  Problem Relation Age of  Onset   Parkinson's disease Father    Heart attack Maternal Grandmother    Diabetes Neg Hx     Health Maintenance  Topic Date Due   Zoster Vaccines- Shingrix (1 of 2) Never done   Hepatitis B Vaccines (3 of 3 - 19+ 3-dose series) 12/13/2012   Pneumococcal Vaccine 37-30 Years old (2 of 2 - PCV) 04/07/2019   OPHTHALMOLOGY EXAM  05/19/2023   Diabetic kidney evaluation - eGFR measurement  09/24/2023   Diabetic kidney evaluation - Urine ACR  09/24/2023   Hepatitis C Screening  01/24/2024 (Originally 01/13/1979)   FOOT EXAM  01/24/2024   HEMOGLOBIN A1C  05/06/2024   Colonoscopy  09/03/2028   DTaP/Tdap/Td (4 - Td or Tdap) 01/30/2031   HIV Screening  Completed   HPV  VACCINES  Aged Out   Meningococcal B Vaccine  Aged Out   INFLUENZA VACCINE  Discontinued   COVID-19 Vaccine  Discontinued     ----------------------------------------------------------------------------------------------------------------------------------------------------------------------------------------------------------------- Physical Exam BP 105/65 (BP Location: Left Arm, Patient Position: Sitting, Cuff Size: Normal)   Pulse 80   Ht 6' 3 (1.905 m)   Wt 224 lb (101.6 kg)   SpO2 97%   BMI 28.00 kg/m   Physical Exam Constitutional:      Appearance: Normal appearance.  HENT:     Head: Normocephalic and atraumatic.  Eyes:     General: No scleral icterus. Cardiovascular:     Rate and Rhythm: Normal rate and regular rhythm.  Pulmonary:     Effort: Pulmonary effort is normal.     Breath sounds: Normal breath sounds.  Musculoskeletal:     Cervical back: Neck supple.  Neurological:     General: No focal deficit present.     Mental Status: He is alert.  Psychiatric:        Mood and Affect: Mood normal.        Behavior: Behavior normal.     ------------------------------------------------------------------------------------------------------------------------------------------------------------------------------------------------------------------- Assessment and Plan  Type 2 diabetes mellitus with hyperlipidemia (HCC) A1c increased some since last visit.  Recommend continuation of current medications.  Continue to work on dietary changes as well as increased activity.  [facility] Management endocrinology.  He is being switched from injectable to oral testosterone .  Cardioembolic stroke, old, with hemiparesis (HCC) He will continue aspirin  as well as Xarelto until closure of PFO.  He has this next month.  Statin intolerance Currently is prescribed Repatha.  Will plan to continue.   No orders of the defined types were placed in this encounter.   No  follow-ups on file.

## 2023-11-06 NOTE — Assessment & Plan Note (Signed)
 He will continue aspirin  as well as Xarelto until closure of PFO.  He has this next month.

## 2023-11-06 NOTE — Assessment & Plan Note (Signed)
 Management endocrinology.  He is being switched from injectable to oral testosterone .

## 2023-11-06 NOTE — Assessment & Plan Note (Signed)
 A1c increased some since last visit.  Recommend continuation of current medications.  Continue to work on dietary changes as well as increased activity.

## 2023-11-06 NOTE — Assessment & Plan Note (Signed)
 Currently is prescribed Repatha.  Will plan to continue.

## 2023-11-10 DIAGNOSIS — R41841 Cognitive communication deficit: Secondary | ICD-10-CM | POA: Diagnosis not present

## 2023-11-17 DIAGNOSIS — R41841 Cognitive communication deficit: Secondary | ICD-10-CM | POA: Diagnosis not present

## 2023-11-18 ENCOUNTER — Telehealth: Payer: Self-pay | Admitting: Family Medicine

## 2023-11-18 NOTE — Telephone Encounter (Signed)
 Copied from CRM [id number]. Topic: Medical Record Request - Other >> Nov 18, 2023  1:42 PM Laurier BROCKS wrote: Reason for CRM: Nancyann from [employer] [phone] called regarding patients disability paperwork. Nancyann states they recv'd the paperwork but they are needing additional information. Donald states Dr. Alvia provided  2 dates on the forms but did not indicate what the patient was seen /treated for on these dates 09-24-22 & 01-24-23. When returning the call to provide the information please reference claim # [id number]

## 2023-11-22 NOTE — Telephone Encounter (Signed)
 Missing dates copied and faxed to Attn: claims  @ fax # [phone]

## 2023-11-23 ENCOUNTER — Other Ambulatory Visit: Payer: Self-pay | Admitting: Family Medicine

## 2023-11-24 DIAGNOSIS — R41841 Cognitive communication deficit: Secondary | ICD-10-CM | POA: Diagnosis not present

## 2023-12-01 ENCOUNTER — Other Ambulatory Visit: Payer: Self-pay | Admitting: Family Medicine

## 2023-12-01 DIAGNOSIS — R41841 Cognitive communication deficit: Secondary | ICD-10-CM | POA: Diagnosis not present

## 2023-12-01 DIAGNOSIS — E1169 Type 2 diabetes mellitus with other specified complication: Secondary | ICD-10-CM

## 2023-12-14 ENCOUNTER — Other Ambulatory Visit: Payer: Self-pay | Admitting: Family Medicine

## 2023-12-14 NOTE — Telephone Encounter (Unsigned)
 Copied from CRM #[id number]. Topic: Clinical - Medication Refill >> Dec 14, 2023  1:23 PM Fredrica W wrote: Medication: RYBELSUS  14 MG TABS - 3 month supply - states problem with insurance.   Has the patient contacted their pharmacy? Yes (Agent: If no, request that the patient contact the pharmacy for the refill. If patient does not wish to contact the pharmacy document the reason why and proceed with request.) (Agent: If yes, when and what did the pharmacy advise?)  This is the patient's preferred pharmacy:  [facility] [address] [place] - [address] [place] [address] Phone: [phone] Fax: [phone]  Is this the correct pharmacy for this prescription? No If no, delete pharmacy and type the correct one.   Has the prescription been filled recently? No  Is the patient out of the medication? No  Has the patient been seen for an appointment in the last year OR does the patient have an upcoming appointment? Yes  Can we respond through MyChart? No  Agent: Please be advised that Rx refills may take up to 3 business days. We ask that you follow-up with your pharmacy.

## 2023-12-15 DIAGNOSIS — Z79899 Other long term (current) drug therapy: Secondary | ICD-10-CM | POA: Diagnosis not present

## 2023-12-15 DIAGNOSIS — Z91040 Latex allergy status: Secondary | ICD-10-CM | POA: Diagnosis not present

## 2023-12-15 DIAGNOSIS — E7800 Pure hypercholesterolemia, unspecified: Secondary | ICD-10-CM | POA: Diagnosis not present

## 2023-12-15 DIAGNOSIS — Z888 Allergy status to other drugs, medicaments and biological substances status: Secondary | ICD-10-CM | POA: Diagnosis not present

## 2023-12-15 DIAGNOSIS — Z7901 Long term (current) use of anticoagulants: Secondary | ICD-10-CM | POA: Diagnosis not present

## 2023-12-15 DIAGNOSIS — Z8673 Personal history of transient ischemic attack (TIA), and cerebral infarction without residual deficits: Secondary | ICD-10-CM | POA: Diagnosis not present

## 2023-12-15 DIAGNOSIS — Z886 Allergy status to analgesic agent status: Secondary | ICD-10-CM | POA: Diagnosis not present

## 2023-12-15 DIAGNOSIS — E119 Type 2 diabetes mellitus without complications: Secondary | ICD-10-CM | POA: Diagnosis not present

## 2023-12-15 DIAGNOSIS — Z91048 Other nonmedicinal substance allergy status: Secondary | ICD-10-CM | POA: Diagnosis not present

## 2023-12-15 DIAGNOSIS — Z86711 Personal history of pulmonary embolism: Secondary | ICD-10-CM | POA: Diagnosis not present

## 2023-12-15 DIAGNOSIS — Z8774 Personal history of (corrected) congenital malformations of heart and circulatory system: Secondary | ICD-10-CM | POA: Diagnosis not present

## 2023-12-15 DIAGNOSIS — Q2112 Patent foramen ovale: Secondary | ICD-10-CM | POA: Diagnosis not present

## 2023-12-15 DIAGNOSIS — Z7984 Long term (current) use of oral hypoglycemic drugs: Secondary | ICD-10-CM | POA: Diagnosis not present

## 2023-12-16 DIAGNOSIS — Z888 Allergy status to other drugs, medicaments and biological substances status: Secondary | ICD-10-CM | POA: Diagnosis not present

## 2023-12-16 DIAGNOSIS — Z79899 Other long term (current) drug therapy: Secondary | ICD-10-CM | POA: Diagnosis not present

## 2023-12-16 DIAGNOSIS — Z8673 Personal history of transient ischemic attack (TIA), and cerebral infarction without residual deficits: Secondary | ICD-10-CM | POA: Diagnosis not present

## 2023-12-16 DIAGNOSIS — Z7984 Long term (current) use of oral hypoglycemic drugs: Secondary | ICD-10-CM | POA: Diagnosis not present

## 2023-12-16 DIAGNOSIS — Z86711 Personal history of pulmonary embolism: Secondary | ICD-10-CM | POA: Diagnosis not present

## 2023-12-16 DIAGNOSIS — Z7901 Long term (current) use of anticoagulants: Secondary | ICD-10-CM | POA: Diagnosis not present

## 2023-12-16 DIAGNOSIS — Z886 Allergy status to analgesic agent status: Secondary | ICD-10-CM | POA: Diagnosis not present

## 2023-12-16 DIAGNOSIS — Z91040 Latex allergy status: Secondary | ICD-10-CM | POA: Diagnosis not present

## 2023-12-16 DIAGNOSIS — Z91048 Other nonmedicinal substance allergy status: Secondary | ICD-10-CM | POA: Diagnosis not present

## 2023-12-16 DIAGNOSIS — Q2112 Patent foramen ovale: Secondary | ICD-10-CM | POA: Diagnosis not present

## 2023-12-16 DIAGNOSIS — E119 Type 2 diabetes mellitus without complications: Secondary | ICD-10-CM | POA: Diagnosis not present

## 2023-12-16 DIAGNOSIS — E7800 Pure hypercholesterolemia, unspecified: Secondary | ICD-10-CM | POA: Diagnosis not present

## 2023-12-20 DIAGNOSIS — I69359 Hemiplegia and hemiparesis following cerebral infarction affecting unspecified side: Secondary | ICD-10-CM | POA: Diagnosis not present

## 2023-12-20 DIAGNOSIS — R079 Chest pain, unspecified: Secondary | ICD-10-CM | POA: Diagnosis not present

## 2023-12-20 DIAGNOSIS — Q2112 Patent foramen ovale: Secondary | ICD-10-CM | POA: Diagnosis not present

## 2023-12-20 DIAGNOSIS — I2602 Saddle embolus of pulmonary artery with acute cor pulmonale: Secondary | ICD-10-CM | POA: Diagnosis not present

## 2023-12-22 DIAGNOSIS — Z8774 Personal history of (corrected) congenital malformations of heart and circulatory system: Secondary | ICD-10-CM | POA: Diagnosis not present

## 2023-12-22 DIAGNOSIS — R1909 Other intra-abdominal and pelvic swelling, mass and lump: Secondary | ICD-10-CM | POA: Diagnosis not present

## 2023-12-22 DIAGNOSIS — Q2112 Patent foramen ovale: Secondary | ICD-10-CM | POA: Diagnosis not present

## 2023-12-25 ENCOUNTER — Encounter: Payer: Self-pay | Admitting: Family Medicine

## 2023-12-26 MED ORDER — RYBELSUS 14 MG PO TABS: 1.0000 | ORAL_TABLET | Freq: Every day | ORAL | 0 refills | Status: DC

## 2023-12-27 ENCOUNTER — Other Ambulatory Visit: Payer: Self-pay

## 2023-12-27 DIAGNOSIS — E1169 Type 2 diabetes mellitus with other specified complication: Secondary | ICD-10-CM

## 2023-12-27 NOTE — Telephone Encounter (Signed)
 Pended prescriptions written by Dr. Will - please see message from patient.  Patient [facility] [name] 5mg   Last writtten 09/05/2023 Hydroxyzine  50mg   Last written 11/23/2023 Tadalfil 5mg   Last written 02/03/2021 Rybelsus  - just written yesterday as 90 day supply  Pended for script to be moved.   Last OV 07/18/225 Upcoming appt 01/13/2024

## 2023-12-27 NOTE — Telephone Encounter (Signed)
 Copied from CRM [id number]. Topic: Clinical - Medication Question >> Dec 27, 2023 11:29 AM Cherylann RAMAN wrote: Reason for CRM: Patient is requesting a 90 day supply of FARXIGA  5 MG TABS tablet while he is transferring his insurance. Patient would like all make all other medication to made into 90 day prescriptions. Please send medication to [facility] [place], [place] - [address] [place] [address] Phone: [phone] Fax: [phone] Hours: Not open 24 hours  For additional information please contact [phone].

## 2023-12-29 DIAGNOSIS — Q2112 Patent foramen ovale: Secondary | ICD-10-CM | POA: Diagnosis not present

## 2023-12-29 DIAGNOSIS — I2602 Saddle embolus of pulmonary artery with acute cor pulmonale: Secondary | ICD-10-CM | POA: Diagnosis not present

## 2023-12-29 DIAGNOSIS — R079 Chest pain, unspecified: Secondary | ICD-10-CM | POA: Diagnosis not present

## 2023-12-29 DIAGNOSIS — I69359 Hemiplegia and hemiparesis following cerebral infarction affecting unspecified side: Secondary | ICD-10-CM | POA: Diagnosis not present

## 2023-12-29 DIAGNOSIS — Z8774 Personal history of (corrected) congenital malformations of heart and circulatory system: Secondary | ICD-10-CM | POA: Diagnosis not present

## 2023-12-29 DIAGNOSIS — R0789 Other chest pain: Secondary | ICD-10-CM | POA: Diagnosis not present

## 2023-12-29 DIAGNOSIS — I253 Aneurysm of heart: Secondary | ICD-10-CM | POA: Diagnosis not present

## 2023-12-29 DIAGNOSIS — E785 Hyperlipidemia, unspecified: Secondary | ICD-10-CM | POA: Diagnosis not present

## 2023-12-29 MED ORDER — DAPAGLIFLOZIN PROPANEDIOL 5 MG PO TABS: 5.0000 mg | ORAL_TABLET | Freq: Every day | ORAL | 0 refills | Status: AC

## 2023-12-29 MED ORDER — HYDROXYZINE PAMOATE 50 MG PO CAPS: ORAL_CAPSULE | ORAL | 2 refills | Status: DC

## 2023-12-29 MED ORDER — TADALAFIL 5 MG PO TABS: 5.0000 mg | ORAL_TABLET | Freq: Every day | ORAL | 2 refills | Status: AC

## 2023-12-29 MED ORDER — RYBELSUS 14 MG PO TABS
1.0000 | ORAL_TABLET | Freq: Every day | ORAL | 0 refills | Status: AC
Start: 2023-12-29 — End: ?

## 2023-12-30 DIAGNOSIS — Q2112 Patent foramen ovale: Secondary | ICD-10-CM | POA: Diagnosis not present

## 2023-12-30 DIAGNOSIS — I2609 Other pulmonary embolism with acute cor pulmonale: Secondary | ICD-10-CM | POA: Diagnosis not present

## 2023-12-30 DIAGNOSIS — E785 Hyperlipidemia, unspecified: Secondary | ICD-10-CM | POA: Diagnosis not present

## 2023-12-30 DIAGNOSIS — I2602 Saddle embolus of pulmonary artery with acute cor pulmonale: Secondary | ICD-10-CM | POA: Diagnosis not present

## 2023-12-30 DIAGNOSIS — E1169 Type 2 diabetes mellitus with other specified complication: Secondary | ICD-10-CM | POA: Diagnosis not present

## 2023-12-30 DIAGNOSIS — E349 Endocrine disorder, unspecified: Secondary | ICD-10-CM | POA: Diagnosis not present

## 2023-12-30 DIAGNOSIS — I69359 Hemiplegia and hemiparesis following cerebral infarction affecting unspecified side: Secondary | ICD-10-CM | POA: Diagnosis not present

## 2023-12-30 DIAGNOSIS — Z96642 Presence of left artificial hip joint: Secondary | ICD-10-CM | POA: Diagnosis not present

## 2023-12-30 DIAGNOSIS — R760 Raised antibody titer: Secondary | ICD-10-CM | POA: Diagnosis not present

## 2023-12-30 LAB — HM DIABETES EYE EXAM

## 2024-01-02 ENCOUNTER — Other Ambulatory Visit: Payer: Self-pay

## 2024-01-02 NOTE — Telephone Encounter (Signed)
 Called [name] pharmacy and was told that Farxiga  and hydroxyzine  is ready for pick up with $0 copay for both  Patient is requesting the hydroxyzine  be changed to 90 day supply to be used daily. States he takes this daily with Plavix  prescription that he takes two times daily - states that without the hydroxyzine  he would be itching so is taking this preventatively  Added Plavix  rx under reconcile outside medications to patients med list

## 2024-01-02 NOTE — Telephone Encounter (Signed)
 Copied from CRM #[id number]. Topic: Clinical - Prescription Issue >> Dec 30, 2023  3:29 PM Kevelyn M wrote: Reason for CRM: Patient is requesting dapagliflozin  propanediol (FARXIGA ) 5 MG TABS tablet medication to be sent to [facility] because [facility] will not fill the 90 day supply. Please send hydrOXYzine  (VISTARIL ) 50 MG capsule as well.  [facility]: [address] [place], [place] [address] [phone]

## 2024-01-03 MED ORDER — HYDROXYZINE PAMOATE 50 MG PO CAPS: ORAL_CAPSULE | ORAL | 2 refills | Status: AC

## 2024-01-13 ENCOUNTER — Ambulatory Visit: Admitting: Family Medicine

## 2024-01-13 ENCOUNTER — Encounter: Payer: Self-pay | Admitting: Family Medicine

## 2024-01-13 VITALS — BP 114/69 | HR 75 | Ht 75.0 in | Wt 226.0 lb

## 2024-01-13 DIAGNOSIS — E1169 Type 2 diabetes mellitus with other specified complication: Secondary | ICD-10-CM

## 2024-01-13 DIAGNOSIS — R0789 Other chest pain: Secondary | ICD-10-CM | POA: Diagnosis not present

## 2024-01-13 DIAGNOSIS — I69359 Hemiplegia and hemiparesis following cerebral infarction affecting unspecified side: Secondary | ICD-10-CM | POA: Diagnosis not present

## 2024-01-13 DIAGNOSIS — I2602 Saddle embolus of pulmonary artery with acute cor pulmonale: Secondary | ICD-10-CM

## 2024-01-13 DIAGNOSIS — Z7984 Long term (current) use of oral hypoglycemic drugs: Secondary | ICD-10-CM

## 2024-01-13 DIAGNOSIS — E785 Hyperlipidemia, unspecified: Secondary | ICD-10-CM

## 2024-01-13 DIAGNOSIS — R41841 Cognitive communication deficit: Secondary | ICD-10-CM

## 2024-01-14 LAB — VITAMIN D 25 HYDROXY (VIT D DEFICIENCY, FRACTURES): Vit D, 25-Hydroxy: 22.4 ng/mL — ABNORMAL LOW (ref 30.0–100.0)

## 2024-01-16 ENCOUNTER — Ambulatory Visit: Payer: Self-pay | Admitting: Family Medicine

## 2024-01-16 DIAGNOSIS — R0789 Other chest pain: Secondary | ICD-10-CM | POA: Insufficient documentation

## 2024-01-16 NOTE — Progress Notes (Signed)
 [name] - 63 y.o. male MRN [id number]  Date of birth: April 05, 1961  Subjective No chief complaint on file.   HPI [name] is a 63 year old male here today for follow-up visit.  Reports overall he is doing pretty well.  He did recently have placement of PFO closure device and did well with this.  He has not yet returned back to work.  He does still feel some fatigue with activity.  He also has some sharp chest pain along his breastbone at times.  He has been anticoagulated with Xarelto due to previous history of PE.  He has been followed by hematology for this as well.  He was advised that he can discontinue anticoagulation once he completes his current prescription.  Blood sugars have been pretty well-controlled.  He continues on glimepiride , Rybelsus  and Farxiga .  Tolerating these well at current strength.  Denies symptoms of hypoglycemia.  Last A1c in July was 7.1%.  ROS:  A comprehensive ROS was completed and negative except as noted per HPI  Allergies  Allergen Reactions   Apixaban Itching   Acetaminophen  Other (See Comments)    jittery   Rosuvastatin  Anxiety    Made him feel jittery   Wound Dressing Adhesive Rash   Latex Rash    Past Medical History:  Diagnosis Date   Allergy    Cervical radiculopathy 08/09/2017   Diabetes mellitus without complication (HCC)    Stroke (cerebrum) (HCC) 08/03/2017   Type 2 diabetes mellitus with hyperlipidemia (HCC)     Past Surgical History:  Procedure Laterality Date   ESOPHAGOGASTRODUODENOSCOPY (EGD) WITH PROPOFOL  N/A 05/09/2015   Procedure: ESOPHAGOGASTRODUODENOSCOPY (EGD) WITH PROPOFOL ;  Surgeon: Lupita FORBES Commander, MD;  Location: [facility];  Service: Endoscopy;  Laterality: N/A;  with esophageal dilation   LASER ABLATION Left 02/03/15   VARICOSE VEIN SURGERY      Social History   Socioeconomic History   Marital status: Married    Spouse name: Not on file   Number of children: Not on file   Years of education: Not  on file   Highest education level: Not on file  Occupational History   Not on file  Tobacco Use   Smoking status: Never   Smokeless tobacco: Never  Vaping Use   Vaping status: Never Used  Substance and Sexual Activity   Alcohol use: No    Alcohol/week: 0.0 standard drinks of alcohol   Drug use: No   Sexual activity: Never  Other Topics Concern   Not on file  Social History Narrative   Not on file   Social Drivers of Health   Financial Resource Strain: Low Risk  (06/09/2023)   Received from [facility]   Overall Financial Resource Strain (CARDIA)    Difficulty of Paying Living Expenses: Not very hard  Food Insecurity: No Food Insecurity (12/15/2023)   Received from [facility]   Hunger Vital Sign    Within the past 12 months, you worried that your food would run out before you got the money to buy more.: Never true    Within the past 12 months, the food you bought just didn't last and you didn't have money to get more.: Never true  Transportation Needs: No Transportation Needs (12/15/2023)   Received from [facility] - Transportation    In the past 12 months, has lack of transportation kept you from medical appointments or from getting medications?: No    In the past 12 months, has lack  of transportation kept you from meetings, work, or from getting things needed for daily living?: No  Physical Activity: Not on file  Stress: No Stress Concern Present (12/15/2023)   Received from [facility] - Occupational Stress Questionnaire    Do you feel stress - tense, restless, nervous, or anxious, or unable to sleep at night because your mind is troubled all the time - these days?: Not at all  Social Connections: Unknown (08/20/2021)   Received from [facility]   Social Network    Social Network: Not on file    Family History  Problem Relation Age of Onset   Parkinson's disease Father    Heart attack Maternal Grandmother     Diabetes Neg Hx     Health Maintenance  Topic Date Due   Zoster Vaccines- Shingrix (1 of 2) Never done   Hepatitis B Vaccines 19-59 Average Risk (3 of 3 - 19+ 3-dose series) 12/13/2012   Pneumococcal Vaccine: 50+ Years (2 of 2 - PCV) 04/07/2019   Diabetic kidney evaluation - eGFR measurement  09/24/2023   Diabetic kidney evaluation - Urine ACR  09/24/2023   Hepatitis C Screening  01/24/2024 (Originally 01/13/1979)   FOOT EXAM  01/24/2024   HEMOGLOBIN A1C  05/06/2024   OPHTHALMOLOGY EXAM  12/29/2024   Colonoscopy  09/03/2028   DTaP/Tdap/Td (4 - Td or Tdap) 01/30/2031   HIV Screening  Completed   HPV VACCINES  Aged Out   Meningococcal B Vaccine  Aged Out   Influenza Vaccine  Discontinued   COVID-19 Vaccine  Discontinued     ----------------------------------------------------------------------------------------------------------------------------------------------------------------------------------------------------------------- Physical Exam BP 114/69 (BP Location: Left Arm, Patient Position: Sitting, Cuff Size: Normal)   Pulse 75   Ht 6' 3 (1.905 m)   Wt 226 lb (102.5 kg)   SpO2 97%   BMI 28.25 kg/m   Physical Exam Constitutional:      Appearance: Normal appearance.  Eyes:     General: No scleral icterus. Cardiovascular:     Rate and Rhythm: Normal rate and regular rhythm.  Pulmonary:     Effort: Pulmonary effort is normal.     Breath sounds: Normal breath sounds.  Musculoskeletal:     Cervical back: Neck supple.  Neurological:     Mental Status: He is alert.  Psychiatric:        Mood and Affect: Mood normal.        Behavior: Behavior normal.     ------------------------------------------------------------------------------------------------------------------------------------------------------------------------------------------------------------------- Assessment and Plan  Type 2 diabetes mellitus with hyperlipidemia (HCC) Blood sugars are stable at  this time.  He will continue current medications for management of his diabetes.  Additionally, will continue Repatha for associated hyperlipidemia.  Cardioembolic stroke, old, with hemiparesis (HCC) Recently had PFO closure.  He will finish out his current prescription of Xarelto.  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  Recent CT scan did show recannulation of the right artery with no acute PE.  Cognitive communication deficit He does continue to see SLP.  Chest wall pain Evaluated by cardiology with negative stress test.  Checking vitamin D levels.   No orders of the defined types were placed in this encounter.   Return in about 4 months (around 05/14/2024) for Hypertension, Type 2 Diabetes.

## 2024-01-16 NOTE — Assessment & Plan Note (Signed)
 He does continue to see SLP.

## 2024-01-16 NOTE — Assessment & Plan Note (Signed)
 He has been followed by hematology.  Recent CT scan did show recannulation of the right artery with no acute PE.

## 2024-01-16 NOTE — Assessment & Plan Note (Signed)
 Recently had PFO closure.  He will finish out his current prescription of Xarelto.

## 2024-01-16 NOTE — Assessment & Plan Note (Signed)
 Blood sugars are stable at this time.  He will continue current medications for management of his diabetes.  Additionally, will continue Repatha for associated hyperlipidemia.

## 2024-01-16 NOTE — Assessment & Plan Note (Addendum)
 Evaluated by cardiology with negative stress test.  Checking vitamin D levels.

## 2024-01-17 DIAGNOSIS — R0789 Other chest pain: Secondary | ICD-10-CM | POA: Diagnosis not present

## 2024-01-17 DIAGNOSIS — Z8673 Personal history of transient ischemic attack (TIA), and cerebral infarction without residual deficits: Secondary | ICD-10-CM | POA: Diagnosis not present

## 2024-01-17 DIAGNOSIS — I2602 Saddle embolus of pulmonary artery with acute cor pulmonale: Secondary | ICD-10-CM | POA: Diagnosis not present

## 2024-01-17 DIAGNOSIS — Q2112 Patent foramen ovale: Secondary | ICD-10-CM | POA: Diagnosis not present

## 2024-01-23 ENCOUNTER — Telehealth: Payer: Self-pay

## 2024-01-23 NOTE — Telephone Encounter (Signed)
 The patient mentioned he is planning on returning to work in November as previously discussed with the provider at his last office visit. He wanted the provider to be aware he will need a return to work note prior to returning to work at full or modified capability.

## 2024-02-14 ENCOUNTER — Encounter: Payer: Self-pay | Admitting: Family Medicine

## 2024-02-15 NOTE — Telephone Encounter (Signed)
 LM for pt to return call to set up in office visit

## 2024-02-15 NOTE — Telephone Encounter (Deleted)
 LM for pt to return call to set up an in office visit per Dr. Donnice                                                                                    +

## 2024-02-23 DIAGNOSIS — M25552 Pain in left hip: Secondary | ICD-10-CM | POA: Diagnosis not present

## 2024-02-29 DIAGNOSIS — I2602 Saddle embolus of pulmonary artery with acute cor pulmonale: Secondary | ICD-10-CM | POA: Diagnosis not present

## 2024-02-29 DIAGNOSIS — R0789 Other chest pain: Secondary | ICD-10-CM | POA: Diagnosis not present

## 2024-02-29 DIAGNOSIS — Q2112 Patent foramen ovale: Secondary | ICD-10-CM | POA: Diagnosis not present

## 2024-02-29 DIAGNOSIS — I69359 Hemiplegia and hemiparesis following cerebral infarction affecting unspecified side: Secondary | ICD-10-CM | POA: Diagnosis not present

## 2024-03-01 ENCOUNTER — Encounter: Payer: Self-pay | Admitting: Family Medicine

## 2024-03-01 ENCOUNTER — Ambulatory Visit: Admitting: Family Medicine

## 2024-03-01 VITALS — BP 107/74 | HR 81 | Ht 75.0 in | Wt 230.0 lb

## 2024-03-01 DIAGNOSIS — E785 Hyperlipidemia, unspecified: Secondary | ICD-10-CM | POA: Diagnosis not present

## 2024-03-01 DIAGNOSIS — Z7984 Long term (current) use of oral hypoglycemic drugs: Secondary | ICD-10-CM

## 2024-03-01 DIAGNOSIS — M25552 Pain in left hip: Secondary | ICD-10-CM | POA: Diagnosis not present

## 2024-03-01 DIAGNOSIS — G629 Polyneuropathy, unspecified: Secondary | ICD-10-CM | POA: Diagnosis not present

## 2024-03-01 DIAGNOSIS — E1169 Type 2 diabetes mellitus with other specified complication: Secondary | ICD-10-CM

## 2024-03-01 DIAGNOSIS — I2602 Saddle embolus of pulmonary artery with acute cor pulmonale: Secondary | ICD-10-CM | POA: Diagnosis not present

## 2024-03-01 DIAGNOSIS — R200 Anesthesia of skin: Secondary | ICD-10-CM | POA: Diagnosis not present

## 2024-03-01 NOTE — Progress Notes (Signed)
 [name] - 63 y.o. male MRN [id number]  Date of birth: Mar 02, 1961  Subjective Chief Complaint  Patient presents with   Numbness    HPI [name] is a 63 y.o. male here today for follow up visit.    He reports that he is continuing to have numbness in the bilateral hands.  He has some difficulty with grip strength and reports that he continues to drop things.   He has upcoming cardiac cath with cardiology.  He also had a recent LE US  ordered by cardiology due to atypical chest pian.  This shows chronic DVT of popliteal vein and superficial thrombus of the GSV.  He continues to remain out of work due to continued symptoms and recover from previous DVT, Stroke and PFO closure as well as Xarelto at this time per hematology recommendations..  He has concerns about when he may return to work.    ROS:  A comprehensive ROS was completed and negative except as noted per HPI  Allergies  Allergen Reactions   Apixaban Itching   Acetaminophen  Other (See Comments)    jittery   Rosuvastatin  Anxiety    Made him feel jittery   Wound Dressing Adhesive Rash   Latex Rash    Past Medical History:  Diagnosis Date   Allergy    Cervical radiculopathy 08/09/2017   Diabetes mellitus without complication (HCC)    Stroke (cerebrum) (HCC) 08/03/2017   Type 2 diabetes mellitus with hyperlipidemia (HCC)     Past Surgical History:  Procedure Laterality Date   ESOPHAGOGASTRODUODENOSCOPY (EGD) WITH PROPOFOL  N/A 05/09/2015   Procedure: ESOPHAGOGASTRODUODENOSCOPY (EGD) WITH PROPOFOL ;  Surgeon: Lupita FORBES Commander, MD;  Location: [facility];  Service: Endoscopy;  Laterality: N/A;  with esophageal dilation   LASER ABLATION Left 02/03/15   VARICOSE VEIN SURGERY      Social History   Socioeconomic History   Marital status: Married    Spouse name: Not on file   Number of children: Not on file   Years of education: Not on file   Highest education level: Not on file  Occupational History   Not on  file  Tobacco Use   Smoking status: Never   Smokeless tobacco: Never  Vaping Use   Vaping status: Never Used  Substance and Sexual Activity   Alcohol use: No    Alcohol/week: 0.0 standard drinks of alcohol   Drug use: No   Sexual activity: Never  Other Topics Concern   Not on file  Social History Narrative   Not on file   Social Drivers of Health   Financial Resource Strain: Low Risk  (06/09/2023)   Received from [facility]   Overall Financial Resource Strain (CARDIA)    Difficulty of Paying Living Expenses: Not very hard  Food Insecurity: No Food Insecurity (12/15/2023)   Received from [facility]   Hunger Vital Sign    Within the past 12 months, you worried that your food would run out before you got the money to buy more.: Never true    Within the past 12 months, the food you bought just didn't last and you didn't have money to get more.: Never true  Transportation Needs: No Transportation Needs (12/15/2023)   Received from [facility] - Transportation    In the past 12 months, has lack of transportation kept you from medical appointments or from getting medications?: No    In the past 12 months, has lack of transportation kept you from  meetings, work, or from getting things needed for daily living?: No  Physical Activity: Not on file  Stress: No Stress Concern Present (12/15/2023)   Received from [facility] - Occupational Stress Questionnaire    Do you feel stress - tense, restless, nervous, or anxious, or unable to sleep at night because your mind is troubled all the time - these days?: Not at all  Social Connections: Socially Integrated (03/01/2024)   Social Connection and Isolation Panel    Frequency of Communication with Friends and Family: Three times a week    Frequency of Social Gatherings with Friends and Family: Once a week    Attends Religious Services: 1 to 4 times per year    Active Member of Clubs or  Organizations: Yes    Attends Banker Meetings: 1 to 4 times per year    Marital Status: Married    Family History  Problem Relation Age of Onset   Parkinson's disease Father    Heart attack Maternal Grandmother    Diabetes Neg Hx     Health Maintenance  Topic Date Due   Medicare Annual Wellness (AWV)  Never done   Hepatitis C Screening  Never done   Zoster Vaccines- Shingrix (1 of 2) Never done   Hepatitis B Vaccines 19-59 Average Risk (3 of 3 - 19+ 3-dose series) 12/13/2012   Pneumococcal Vaccine: 50+ Years (2 of 2 - PCV) 04/07/2019   Diabetic kidney evaluation - eGFR measurement  09/24/2023   Diabetic kidney evaluation - Urine ACR  09/24/2023   HEMOGLOBIN A1C  08/29/2024   OPHTHALMOLOGY EXAM  12/29/2024   FOOT EXAM  03/01/2025   Colonoscopy  09/03/2028   DTaP/Tdap/Td (4 - Td or Tdap) 01/30/2031   HIV Screening  Completed   Meningococcal B Vaccine  Aged Out   Influenza Vaccine  Discontinued   COVID-19 Vaccine  Discontinued     ----------------------------------------------------------------------------------------------------------------------------------------------------------------------------------------------------------------- Physical Exam BP 107/74 (BP Location: Left Arm, Patient Position: Sitting, Cuff Size: Normal)   Pulse 81   Ht 6' 3 (1.905 m)   Wt 230 lb (104.3 kg)   SpO2 97%   BMI 28.75 kg/m   Physical Exam Constitutional:      Appearance: Normal appearance.  Eyes:     General: No scleral icterus. Cardiovascular:     Rate and Rhythm: Normal rate and regular rhythm.  Pulmonary:     Effort: Pulmonary effort is normal.     Breath sounds: Normal breath sounds.  Musculoskeletal:     Cervical back: Neck supple.  Neurological:     Mental Status: He is alert.  Psychiatric:        Mood and Affect: Mood normal.        Behavior: Behavior normal.      ------------------------------------------------------------------------------------------------------------------------------------------------------------------------------------------------------------------- Assessment and Plan  Type 2 diabetes mellitus with hyperlipidemia (HCC) Lab Results  Component Value Date   HGBA1C 7.9 (H) 03/01/2024  Blood sugars are elevated.  He has not been quite as active.  Discussed working on dietary changes.  Numbness in both hands Carpal tunnel syndrome versus diabetic neuropathy.  Nerve conduction study ordered.  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  Hematology did discontinue his Xarelto previously.  Denies new swelling or dyspnea.   No orders of the defined types were placed in this encounter.   Return in about 2 months (around 05/01/2024) for Type 2  Diabetes, Hypertension.

## 2024-03-02 LAB — HEMOGLOBIN A1C
Est. average glucose Bld gHb Est-mCnc: 180 mg/dL
Hgb A1c MFr Bld: 7.9 % — ABNORMAL HIGH (ref 4.8–5.6)

## 2024-03-02 LAB — VITAMIN B12: Vitamin B-12: 361 pg/mL (ref 232–1245)

## 2024-03-05 DIAGNOSIS — R200 Anesthesia of skin: Secondary | ICD-10-CM | POA: Insufficient documentation

## 2024-03-05 DIAGNOSIS — G629 Polyneuropathy, unspecified: Secondary | ICD-10-CM | POA: Insufficient documentation

## 2024-03-05 NOTE — Assessment & Plan Note (Signed)
 She has been adequately anticoagulated for greater than 6 months.  Hematology did discontinue his Xarelto previously.  Denies new swelling or dyspnea.

## 2024-03-05 NOTE — Assessment & Plan Note (Signed)
 Carpal tunnel syndrome versus diabetic neuropathy.  Nerve conduction study ordered.

## 2024-03-05 NOTE — Assessment & Plan Note (Signed)
 Lab Results  Component Value Date   HGBA1C 7.9 (H) 03/01/2024  Blood sugars are elevated.  He has not been quite as active.  Discussed working on dietary changes.

## 2024-03-08 DIAGNOSIS — I2609 Other pulmonary embolism with acute cor pulmonale: Secondary | ICD-10-CM | POA: Diagnosis not present

## 2024-03-08 DIAGNOSIS — M25552 Pain in left hip: Secondary | ICD-10-CM | POA: Diagnosis not present

## 2024-03-08 DIAGNOSIS — Z96642 Presence of left artificial hip joint: Secondary | ICD-10-CM | POA: Diagnosis not present

## 2024-03-08 DIAGNOSIS — Z8673 Personal history of transient ischemic attack (TIA), and cerebral infarction without residual deficits: Secondary | ICD-10-CM | POA: Diagnosis not present

## 2024-03-08 DIAGNOSIS — I82432 Acute embolism and thrombosis of left popliteal vein: Secondary | ICD-10-CM | POA: Diagnosis not present

## 2024-03-14 DIAGNOSIS — M25552 Pain in left hip: Secondary | ICD-10-CM | POA: Diagnosis not present

## 2024-03-20 DIAGNOSIS — R079 Chest pain, unspecified: Secondary | ICD-10-CM | POA: Diagnosis not present

## 2024-03-22 ENCOUNTER — Telehealth: Payer: Self-pay

## 2024-03-22 NOTE — Telephone Encounter (Signed)
 Patient informed and will reach out to Dr. Lyle to inquire about lower extremity order for nerve conduction study.

## 2024-03-22 NOTE — Telephone Encounter (Signed)
 Patient was seen at  [facility] [place] with  PA Ludie Isaac  Phone # [phone]

## 2024-03-22 NOTE — Telephone Encounter (Signed)
 Copied from CRM #[id number]. Topic: Clinical - Request for Lab/Test Order >> Mar 21, 2024  3:32 PM Wess RAMAN wrote: Reason for CRM: Patient states he went to [facility] for his Nerve test for upper and lower body. They only had an order for the lower body. He needs an order for the whole body  Callback #: [id number] [facility] #: [phone] [facility])

## 2024-03-22 NOTE — Telephone Encounter (Signed)
 Spoke with [name] at [facility] that they do have the order for the upper body but patient is requesting also lower body order as well  Called patient. - states that he discussed with Dr. Alvia also regarding possible nerve damage after hip surgery by Dr. Lyle  last year  after  total hip replacement.  Can this be added to the order or do we need to have patient contact Dr. Lyle to request the lower body nerve conduction study?

## 2024-03-26 DIAGNOSIS — M25552 Pain in left hip: Secondary | ICD-10-CM | POA: Diagnosis not present

## 2024-03-28 ENCOUNTER — Ambulatory Visit: Payer: Self-pay

## 2024-03-28 ENCOUNTER — Encounter: Payer: Self-pay | Admitting: Family Medicine

## 2024-03-28 NOTE — Telephone Encounter (Signed)
 FYI -   The patient sent a MyChart message regarding a wound he got a week ago. Attempted to contact the patient to schedule an appointment for a wound check. No answer. Left a vm msg for the patient to return a call to the clinic for scheduling. A MyChart message was sent to the patient.   Does provider want to hold the 230 pm appt?

## 2024-03-28 NOTE — Telephone Encounter (Signed)
°  FYI Only or Action Required?: Action required by provider: request for appointment.  Patient was last seen in primary care on 03/01/2024 by Alvia Bring, DO.  Called Nurse Triage reporting Skin wound.  Symptoms began several days ago.  Interventions attempted: Nothing.  Symptoms are: gradually worsening. Injured right shin last week. Draining and pt. Has fever 110.8 and cold symptoms. Declines to be seen in a different office today. Appointment made for tomorrow.  Triage Disposition: See HCP Within 4 Hours (Or PCP Triage)  Patient/caregiver understands and will follow disposition?: Yes     Copied from CRM #[id number]. Topic: Clinical - Red Word Triage >> Mar 28, 2024 11:23 AM Shanda MATSU wrote: Red Word that prompted transfer to Nurse Triage: Tylene Ege (wife) called in to adv that patient sustained a wound on his right shin a week ago that is now oozing a cream colored ooze, patient also reporting that wound hurts. Reason for Disposition  [1] Looks infected (spreading redness, red streak, pus) AND [2] fever  Answer Assessment - Initial Assessment Questions 1. APPEARANCE What does the injury look like?      Silver dollar 2. ONSET: How long ago did the injury occur?      Last week 3. LOCATION: Where is the injury located?      Right shin 4. SIZE: How large is the cut?      Silver dollar 5. BLEEDING: Is it bleeding now? If Yes, ask: Is it difficult to stop?      no 6. PAIN: Is there any pain? If Yes, ask: How bad is the pain? (Scale 0-10; or none, mild, moderate, severe)     sore 7. MECHANISM: Tell me how it happened.      Hit leg 8. TETANUS: When was your last tetanus booster?     N/a 9. PREGNANCY: Is there any chance you are pregnant? When was your last menstrual period?     N/a  Protocols used: Skin Injury-A-AH

## 2024-03-28 NOTE — Telephone Encounter (Signed)
 The patient has been scheduled for tomorrow at 110 with the provider.

## 2024-03-28 NOTE — Telephone Encounter (Signed)
 I called and confirmed actual temperature. He has a temperature of 100.8.

## 2024-03-29 ENCOUNTER — Encounter: Payer: Self-pay | Admitting: Family Medicine

## 2024-03-29 ENCOUNTER — Ambulatory Visit (INDEPENDENT_AMBULATORY_CARE_PROVIDER_SITE_OTHER): Admitting: Family Medicine

## 2024-03-29 VITALS — BP 104/64 | HR 99 | Temp 98.5°F | Ht 75.0 in | Wt 230.0 lb

## 2024-03-29 DIAGNOSIS — J101 Influenza due to other identified influenza virus with other respiratory manifestations: Secondary | ICD-10-CM | POA: Diagnosis not present

## 2024-03-29 DIAGNOSIS — L03115 Cellulitis of right lower limb: Secondary | ICD-10-CM | POA: Diagnosis not present

## 2024-03-29 DIAGNOSIS — R6889 Other general symptoms and signs: Secondary | ICD-10-CM

## 2024-03-29 LAB — POC COVID19/FLU A&B COMBO
Covid Antigen, POC: NEGATIVE
Influenza A Antigen, POC: POSITIVE — AB
Influenza B Antigen, POC: NEGATIVE

## 2024-03-29 MED ORDER — DOXYCYCLINE HYCLATE 100 MG PO TABS: 100.0000 mg | ORAL_TABLET | Freq: Two times a day (BID) | ORAL | 0 refills | Status: DC

## 2024-03-29 MED ORDER — OSELTAMIVIR PHOSPHATE 75 MG PO CAPS: 75.0000 mg | ORAL_CAPSULE | Freq: Two times a day (BID) | ORAL | 0 refills | Status: AC

## 2024-03-29 NOTE — Assessment & Plan Note (Signed)
 Wound with surrounding cellulitis.  Treating with course of doxycycline.  Red flags reviewed.

## 2024-03-29 NOTE — Progress Notes (Signed)
 [name] - 63 y.o. male MRN [id number]  Date of birth: 03/23/1961  Subjective Chief Complaint  Patient presents with   URI    HPI [name] is a 63 y.o. male here today for with complaint of possible infected wound.  He scraped  his R lower leg about 1 weeks ago on a piece of tile while working in his garage.  Noticed yellow drainage around this area a couple of days ago.  He has tried neosporin.    Also with some sinus congestion with pressure.  Had fever associated with this.  He does have intermittent cough.  Denies dyspnea or wheezing. Symptom onset was 1-2 days ago.   ROS:  A comprehensive ROS was completed and negative except as noted per HPI  Allergies[1]  Past Medical History:  Diagnosis Date   Allergy    Cervical radiculopathy 08/09/2017   Diabetes mellitus without complication (HCC)    Stroke (cerebrum) (HCC) 08/03/2017   Type 2 diabetes mellitus with hyperlipidemia [facility])     Past Surgical History:  Procedure Laterality Date   ESOPHAGOGASTRODUODENOSCOPY (EGD) WITH PROPOFOL  N/A 05/09/2015   Procedure: ESOPHAGOGASTRODUODENOSCOPY (EGD) WITH PROPOFOL ;  Surgeon: Lupita FORBES Commander, MD;  Location: [facility];  Service: Endoscopy;  Laterality: N/A;  with esophageal dilation   LASER ABLATION Left 02/03/15   VARICOSE VEIN SURGERY      Social History   Socioeconomic History   Marital status: Married    Spouse name: Not on file   Number of children: Not on file   Years of education: Not on file   Highest education level: Not on file  Occupational History   Not on file  Tobacco Use   Smoking status: Never   Smokeless tobacco: Never  Vaping Use   Vaping status: Never Used  Substance and Sexual Activity   Alcohol use: No    Alcohol/week: 0.0 standard drinks of alcohol   Drug use: No   Sexual activity: Never  Other Topics Concern   Not on file  Social History Narrative   Not on file   Social Drivers of Health   Tobacco Use: Low Risk (03/29/2024)    Patient History    Smoking Tobacco Use: Never    Smokeless Tobacco Use: Never    Passive Exposure: Not on file  Financial Resource Strain: Low Risk (06/09/2023)   Received from [facility]   Overall Financial Resource Strain (CARDIA)    Difficulty of Paying Living Expenses: Not very hard  Food Insecurity: No Food Insecurity (12/15/2023)   Received from [facility]   Epic    Within the past 12 months, you worried that your food would run out before you got the money to buy more.: Never true    Within the past 12 months, the food you bought just didn't last and you didn't have money to get more.: Never true  Transportation Needs: No Transportation Needs (12/15/2023)   Received [facility]    In the past 12 months, has lack of transportation kept you from medical appointments or from getting medications?: No    In the past 12 months, has lack of transportation kept you from meetings, work, or from getting things needed for daily living?: No  Physical Activity: Not on file  Stress: No Stress Concern Present (03/20/2024)   Received from [facility] of Occupational Health - Occupational Stress Questionnaire    Do you feel stress - tense, restless, nervous,  or anxious, or unable to sleep at night because your mind is troubled all the time - these days?: Not at all  Social Connections: Socially Integrated (03/01/2024)   Social Connection and Isolation Panel    Frequency of Communication with Friends and Family: Three times a week    Frequency of Social Gatherings with Friends and Family: Once a week    Attends Religious Services: 1 to 4 times per year    Active Member of Clubs or Organizations: Yes    Attends Banker Meetings: 1 to 4 times per year    Marital Status: Married  Depression (PHQ2-9): Low Risk (11/04/2023)   Depression (PHQ2-9)    PHQ-2 Score: 0  Alcohol Screen: Low Risk (03/01/2024)   Alcohol Screen    Last Alcohol Screening  Score (AUDIT): 0  Housing: Low Risk (12/15/2023)   Received from [facility]    In the last 12 months, was there a time when you were not able to pay the mortgage or rent on time?: No    In the past 12 months, how many times have you moved where you were living?: 0    At any time in the past 12 months, were you homeless or living in a shelter (including now)?: No  Utilities: Not At Risk (12/15/2023)   Received from [facility]    In the past 12 months has the electric, gas, oil, or water company threatened to shut off services in your home?: No  Health Literacy: Adequate Health Literacy (03/01/2024)   B1300 Health Literacy    Frequency of need for help with medical instructions: Never    Family History  Problem Relation Age of Onset   Parkinson's disease Father    Heart attack Maternal Grandmother    Diabetes Neg Hx     Health Maintenance  Topic Date Due   Medicare Annual Wellness (AWV)  Never done   Hepatitis C Screening  Never done   Diabetic kidney evaluation - eGFR measurement  09/24/2023   Diabetic kidney evaluation - Urine ACR  09/24/2023   Zoster Vaccines- Shingrix (1 of 2) 06/08/2024 (Originally 01/13/1978)   Influenza Vaccine  07/17/2024 (Originally 11/18/2023)   Pneumococcal Vaccine: 50+ Years (2 of 2 - PCV) 03/08/2025 (Originally 04/07/2019)   Hepatitis B Vaccines 19-59 Average Risk (3 of 3 - 19+ 3-dose series) 03/08/2025 (Originally 12/13/2012)   HEMOGLOBIN A1C  08/29/2024   OPHTHALMOLOGY EXAM  12/29/2024   FOOT EXAM  03/01/2025   Colonoscopy  09/03/2028   DTaP/Tdap/Td (4 - Td or Tdap) 01/30/2031   HIV Screening  Completed   Meningococcal B Vaccine  Aged Out   COVID-19 Vaccine  Discontinued     ----------------------------------------------------------------------------------------------------------------------------------------------------------------------------------------------------------------- Physical Exam BP 104/64   Pulse 99   Temp  98.5 F (36.9 C) (Oral)   Ht 6' 3 (1.905 m)   Wt 230 lb (104.3 kg)   SpO2 93%   BMI 28.75 kg/m   Physical Exam Constitutional:      Appearance: Normal appearance.  HENT:     Head: Normocephalic and atraumatic.  Cardiovascular:     Rate and Rhythm: Normal rate and regular rhythm.  Pulmonary:     Effort: Pulmonary effort is normal.     Breath sounds: Normal breath sounds.  Musculoskeletal:     Cervical back: Neck supple.  Skin:    Comments: Wound on R LE with surrounding erythema.  Small amount of yellow drainage.    Neurological:  General: No focal deficit present.     Mental Status: He is alert.  Psychiatric:        Mood and Affect: Mood normal.        Behavior: Behavior normal.     ------------------------------------------------------------------------------------------------------------------------------------------------------------------------------------------------------------------- Assessment and Plan  Influenza A Recommend supportive care with increased fluids and supportive care.  Adding tamiflu as well.   Cellulitis of right lower extremity Wound with surrounding cellulitis.  Treating with course of doxycycline.  Red flags reviewed.    Meds ordered this encounter  Medications   doxycycline (VIBRA-TABS) 100 MG tablet    Sig: Take 1 tablet (100 mg total) by mouth 2 (two) times daily.    Dispense:  20 tablet    Refill:  0   oseltamivir (TAMIFLU) 75 MG capsule    Sig: Take 1 capsule (75 mg total) by mouth 2 (two) times daily for 5 days.    Dispense:  10 capsule    Refill:  0    No follow-ups on file.        [1]  Allergies Allergen Reactions   Apixaban Itching   Acetaminophen  Other (See Comments)    jittery   Rosuvastatin  Anxiety    Made him feel jittery   Wound Dressing Adhesive Rash   Latex Rash

## 2024-03-29 NOTE — Assessment & Plan Note (Signed)
 Recommend supportive care with increased fluids and supportive care.  Adding tamiflu as well.

## 2024-03-29 NOTE — Patient Instructions (Signed)

## 2024-04-04 ENCOUNTER — Ambulatory Visit: Admitting: Family Medicine

## 2024-04-04 ENCOUNTER — Other Ambulatory Visit: Payer: Self-pay | Admitting: Family Medicine

## 2024-05-08 ENCOUNTER — Other Ambulatory Visit: Payer: Self-pay | Admitting: Family Medicine

## 2024-05-08 DIAGNOSIS — E1169 Type 2 diabetes mellitus with other specified complication: Secondary | ICD-10-CM

## 2024-05-14 ENCOUNTER — Ambulatory Visit: Admitting: Family Medicine

## 2024-05-16 ENCOUNTER — Encounter: Payer: Self-pay | Admitting: Family Medicine

## 2024-05-16 ENCOUNTER — Ambulatory Visit: Admitting: Family Medicine

## 2024-05-16 VITALS — BP 113/69 | HR 82 | Ht 75.0 in | Wt 231.0 lb

## 2024-05-16 DIAGNOSIS — E291 Testicular hypofunction: Secondary | ICD-10-CM | POA: Diagnosis not present

## 2024-05-16 DIAGNOSIS — E1169 Type 2 diabetes mellitus with other specified complication: Secondary | ICD-10-CM

## 2024-05-16 DIAGNOSIS — E782 Mixed hyperlipidemia: Secondary | ICD-10-CM

## 2024-05-16 DIAGNOSIS — G629 Polyneuropathy, unspecified: Secondary | ICD-10-CM

## 2024-05-16 LAB — POCT GLYCOSYLATED HEMOGLOBIN (HGB A1C): HbA1c, POC (controlled diabetic range): 7.9 % — AB (ref 0.0–7.0)

## 2024-05-16 NOTE — Progress Notes (Signed)
 " [name] - 64 y.o. male MRN [id number]  Date of birth: [birth year]  Subjective Chief Complaint  Patient presents with   Hypertension   Diabetes    HPI [name] is a 64 y.o. male here today for follow up visit.   He reports that he is doing pretty well. [name]   He remains on Rybelsus , glimeperide and farxiga  for management of diabetes.  He is doing well with these at current strength.  A1c today is 7.9%.  He has had some neuropathic symptoms and recently had NCV/EMG showing peripheral neuropathy and CTS on the R side.    He has had follow up with cardiology and is off of plavix  and xarelto.  Remains on repatha.    Testosterone  is managed by urology  Stable with Jatenzo at current strength.   ROS:  A comprehensive ROS was completed and negative except as noted per HPI  Allergies[1]  Past Medical History:  Diagnosis Date   Allergy    Cervical radiculopathy 08/09/2017   Diabetes mellitus without complication (HCC)    Hx of total hip arthroplasty 03/22/2023   Left   Stroke (cerebrum) (HCC) 08/03/2017   Type 2 diabetes mellitus with hyperlipidemia [facility])     Past Surgical History:  Procedure Laterality Date   ESOPHAGOGASTRODUODENOSCOPY (EGD) WITH PROPOFOL  N/A 05/09/2015   Procedure: ESOPHAGOGASTRODUODENOSCOPY (EGD) WITH PROPOFOL ;  Surgeon: Lupita FORBES Commander, MD;  Location: [facility];  Service: Endoscopy;  Laterality: N/A;  with esophageal dilation   LASER ABLATION Left 02/03/2015   PATENT FORAMEN OVALE CLOSURE     VARICOSE VEIN SURGERY      Social History   Socioeconomic History   Marital status: Married    Spouse name: Not on file   Number of children: Not on file   Years of education: Not on file   Highest education level: Not on file  Occupational History   Not on file  Tobacco Use   Smoking status: Never   Smokeless tobacco: Never  Vaping Use   Vaping status: Never Used  Substance and Sexual Activity   Alcohol use: No    Alcohol/week: 0.0 standard  drinks of alcohol   Drug use: No   Sexual activity: Never  Other Topics Concern   Not on file  Social History Narrative   Not on file   Social Drivers of Health   Tobacco Use: Low Risk (05/12/2024)   Received from [facility]   Patient History    Smoking Tobacco Use: Never    Smokeless Tobacco Use: Never    Passive Exposure: Not on file  Financial Resource Strain: Low Risk (06/09/2023)   Received from [facility]   Overall Financial Resource Strain (CARDIA)    Difficulty of Paying Living Expenses: Not very hard  Food Insecurity: No Food Insecurity (12/15/2023)   Received from [facility]   Epic    Within the past 12 months, you worried that your food would run out before you got the money to buy more.: Never true    Within the past 12 months, the food you bought just didn't last and you didn't have money to get more.: Never true  Transportation Needs: No Transportation Needs (12/15/2023)   Received from [facility]    In the past 12 months, has lack of transportation kept you from medical appointments or from getting medications?: No    In the past 12 months, has lack of transportation kept you from meetings, work, or  from getting things needed for daily living?: No  Physical Activity: Not on file  Stress: No Stress Concern Present (03/20/2024)   Received from [facility] [place] of Occupational Health - Occupational Stress Questionnaire    Do you feel stress - tense, restless, nervous, or anxious, or unable to sleep at night because your mind is troubled all the time - these days?: Not at all  Social Connections: Socially Integrated (03/01/2024)   Social Connection and Isolation Panel    Frequency of Communication with Friends and Family: Three times a week    Frequency of Social Gatherings with Friends and Family: Once a week    Attends Religious Services: 1 to 4 times per year    Active Member of Clubs or Organizations: Yes    Attends Tax Inspector Meetings: 1 to 4 times per year    Marital Status: Married  Depression (PHQ2-9): Low Risk (11/04/2023)   Depression (PHQ2-9)    PHQ-2 Score: 0  Alcohol Screen: Low Risk (03/01/2024)   Alcohol Screen    Last Alcohol Screening Score (AUDIT): 0  Housing: Low Risk (12/15/2023)   Received from [facility]    In the last 12 months, was there a time when you were not able to pay the mortgage or rent on time?: No    In the past 12 months, how many times have you moved where you were living?: 0    At any time in the past 12 months, were you homeless or living in a shelter (including now)?: No  Utilities: Not At Risk (12/15/2023)   Received from [facility]    In the past 12 months has the electric, gas, oil, or water company threatened to shut off services in your home?: No  Health Literacy: Adequate Health Literacy (03/01/2024)   B1300 Health Literacy    Frequency of need for help with medical instructions: Never    Family History  Problem Relation Age of Onset   Parkinson's disease Father    Heart attack Maternal Grandmother    Diabetes Neg Hx     Health Maintenance  Topic Date Due   Medicare Annual Wellness (AWV)  Never done   Hepatitis C Screening  Never done   Diabetic kidney evaluation - eGFR measurement  09/24/2023   Diabetic kidney evaluation - Urine ACR  09/24/2023   Zoster Vaccines- Shingrix (1 of 2) 06/08/2024 (Originally 01/13/1978)   Influenza Vaccine  07/17/2024 (Originally 11/18/2023)   Pneumococcal Vaccine: 50+ Years (2 of 2 - PCV) 03/08/2025 (Originally 04/07/2019)   Hepatitis B Vaccines 19-59 Average Risk (3 of 3 - 19+ 3-dose series) 03/08/2025 (Originally 12/13/2012)   HEMOGLOBIN A1C  08/29/2024   OPHTHALMOLOGY EXAM  12/29/2024   FOOT EXAM  03/01/2025   Colonoscopy  09/03/2028   DTaP/Tdap/Td (4 - Td or Tdap) 01/30/2031   HIV Screening  Completed   Meningococcal B Vaccine  Aged Out   COVID-19 Vaccine  Discontinued      ----------------------------------------------------------------------------------------------------------------------------------------------------------------------------------------------------------------- Physical Exam BP 113/69 (BP Location: Left Arm, Patient Position: Sitting, Cuff Size: Large)   Pulse 82   Ht 6' 3 (1.905 m)   Wt 231 lb (104.8 kg)   SpO2 95%   BMI 28.87 kg/m   Physical Exam Constitutional:      Appearance: Normal appearance.  HENT:     Head: Normocephalic and atraumatic.  Eyes:     General: No scleral icterus. Cardiovascular:     Rate  and Rhythm: Normal rate and regular rhythm.  Pulmonary:     Effort: Pulmonary effort is normal.     Breath sounds: Normal breath sounds.  Musculoskeletal:     Cervical back: Neck supple.  Neurological:     Mental Status: He is alert.  Psychiatric:        Mood and Affect: Mood normal.        Behavior: Behavior normal.     ------------------------------------------------------------------------------------------------------------------------------------------------------------------------------------------------------------------- Assessment and Plan  Type 2 diabetes mellitus with hyperlipidemia (HCC) Diabetes control has worsened.  Admits to poor diet over the past several weeks.  He will get back on track with this. F/u in 3-4 months.   Hypogonadism in male Hypogonadism secondary to Klinefelter syndrome.  Recently restarted Jatenzo which is being managed by urology.   Hyperlipidemia Continue repatha at current strengthe.    Neuropathy Discussed importance of keeping diabetes well controlled.  He will discuss carpal tunnel injection with sports medicine.    No orders of the defined types were placed in this encounter.   Return in about 3 months (around 08/14/2024) for Type 2 Diabetes.        [1]  Allergies Allergen Reactions   Apixaban Itching   Acetaminophen  Other (See Comments)    jittery    Rosuvastatin  Anxiety    Made him feel jittery   Wound Dressing Adhesive Rash   Latex Rash   "

## 2024-05-16 NOTE — Assessment & Plan Note (Signed)
 Discussed importance of keeping diabetes well controlled.  He will discuss carpal tunnel injection with sports medicine.

## 2024-05-16 NOTE — Assessment & Plan Note (Signed)
 Continue repatha at current strengthe.

## 2024-05-16 NOTE — Assessment & Plan Note (Signed)
 Hypogonadism secondary to Klinefelter syndrome.  Recently restarted Jatenzo which is being managed by urology.

## 2024-05-16 NOTE — Assessment & Plan Note (Signed)
 Diabetes control has worsened.  Admits to poor diet over the past several weeks.  He will get back on track with this. F/u in 3-4 months.

## 2024-05-17 ENCOUNTER — Ambulatory Visit: Admitting: Family Medicine

## 2024-05-17 LAB — MICROALBUMIN / CREATININE URINE RATIO
Creatinine, Urine: 89.1 mg/dL
Microalb/Creat Ratio: 116 mg/g{creat} — ABNORMAL HIGH (ref 0–29)
Microalbumin, Urine: 103.3 ug/mL

## 2024-05-21 ENCOUNTER — Ambulatory Visit: Admitting: Family Medicine

## 2024-05-25 ENCOUNTER — Ambulatory Visit: Payer: Self-pay | Admitting: Family Medicine

## 2024-08-14 ENCOUNTER — Ambulatory Visit: Admitting: Family Medicine
